# Patient Record
Sex: Male | Born: 1940 | Race: White | Hispanic: No | Marital: Married | Smoking: Never smoker
Health system: Southern US, Community
[De-identification: ages and names within clinical notes are randomized; demographics above are authoritative.]

## PROBLEM LIST (undated history)

## (undated) DIAGNOSIS — K219 Gastro-esophageal reflux disease without esophagitis: Secondary | ICD-10-CM

## (undated) DIAGNOSIS — E785 Hyperlipidemia, unspecified: Secondary | ICD-10-CM

## (undated) DIAGNOSIS — K635 Polyp of colon: Secondary | ICD-10-CM

## (undated) DIAGNOSIS — E0590 Thyrotoxicosis, unspecified without thyrotoxic crisis or storm: Secondary | ICD-10-CM

## (undated) DIAGNOSIS — I10 Essential (primary) hypertension: Secondary | ICD-10-CM

## (undated) DIAGNOSIS — B9681 Helicobacter pylori [H. pylori] as the cause of diseases classified elsewhere: Secondary | ICD-10-CM

## (undated) DIAGNOSIS — K2970 Gastritis, unspecified, without bleeding: Secondary | ICD-10-CM

## (undated) DIAGNOSIS — R7303 Prediabetes: Secondary | ICD-10-CM

## (undated) DIAGNOSIS — M1990 Unspecified osteoarthritis, unspecified site: Secondary | ICD-10-CM

## (undated) DIAGNOSIS — M109 Gout, unspecified: Secondary | ICD-10-CM

## (undated) HISTORY — DX: Essential (primary) hypertension: I10

## (undated) HISTORY — DX: Unspecified osteoarthritis, unspecified site: M19.90

## (undated) HISTORY — DX: Helicobacter pylori (H. pylori) as the cause of diseases classified elsewhere: B96.81

## (undated) HISTORY — DX: Gout, unspecified: M10.9

## (undated) HISTORY — DX: Thyrotoxicosis, unspecified without thyrotoxic crisis or storm: E05.90

## (undated) HISTORY — DX: Prediabetes: R73.03

## (undated) HISTORY — DX: Polyp of colon: K63.5

## (undated) HISTORY — DX: Gastro-esophageal reflux disease without esophagitis: K21.9

## (undated) HISTORY — PX: MOLE REMOVAL: SHX2046

## (undated) HISTORY — DX: Gastritis, unspecified, without bleeding: K29.70

## (undated) HISTORY — DX: Hyperlipidemia, unspecified: E78.5

---

## 1976-02-18 HISTORY — PX: VASECTOMY: SHX75

## 1997-02-17 HISTORY — PX: BICEPS TENDON REPAIR: SHX566

## 2001-04-07 ENCOUNTER — Ambulatory Visit (HOSPITAL_COMMUNITY): Admission: RE | Admit: 2001-04-07 | Discharge: 2001-04-07 | Payer: Self-pay | Admitting: Internal Medicine

## 2005-10-29 ENCOUNTER — Encounter: Admission: RE | Admit: 2005-10-29 | Discharge: 2005-10-29 | Payer: Self-pay | Admitting: Internal Medicine

## 2005-11-18 ENCOUNTER — Inpatient Hospital Stay (HOSPITAL_COMMUNITY): Admission: RE | Admit: 2005-11-18 | Discharge: 2005-11-19 | Payer: Self-pay | Admitting: Orthopedic Surgery

## 2006-05-26 ENCOUNTER — Ambulatory Visit: Payer: Self-pay | Admitting: Gastroenterology

## 2006-06-10 ENCOUNTER — Ambulatory Visit: Payer: Self-pay | Admitting: Gastroenterology

## 2008-02-18 HISTORY — PX: BACK SURGERY: SHX140

## 2010-07-04 ENCOUNTER — Other Ambulatory Visit: Payer: Self-pay | Admitting: Dermatology

## 2011-03-27 DIAGNOSIS — L821 Other seborrheic keratosis: Secondary | ICD-10-CM | POA: Diagnosis not present

## 2011-03-27 DIAGNOSIS — D239 Other benign neoplasm of skin, unspecified: Secondary | ICD-10-CM | POA: Diagnosis not present

## 2011-03-27 DIAGNOSIS — D1801 Hemangioma of skin and subcutaneous tissue: Secondary | ICD-10-CM | POA: Diagnosis not present

## 2011-03-27 DIAGNOSIS — D485 Neoplasm of uncertain behavior of skin: Secondary | ICD-10-CM | POA: Diagnosis not present

## 2011-03-27 DIAGNOSIS — L905 Scar conditions and fibrosis of skin: Secondary | ICD-10-CM | POA: Diagnosis not present

## 2011-04-15 DIAGNOSIS — J069 Acute upper respiratory infection, unspecified: Secondary | ICD-10-CM | POA: Diagnosis not present

## 2011-04-15 DIAGNOSIS — N401 Enlarged prostate with lower urinary tract symptoms: Secondary | ICD-10-CM | POA: Diagnosis not present

## 2011-04-15 DIAGNOSIS — I10 Essential (primary) hypertension: Secondary | ICD-10-CM | POA: Diagnosis not present

## 2011-04-15 DIAGNOSIS — E119 Type 2 diabetes mellitus without complications: Secondary | ICD-10-CM | POA: Diagnosis not present

## 2011-04-17 DIAGNOSIS — D485 Neoplasm of uncertain behavior of skin: Secondary | ICD-10-CM | POA: Diagnosis not present

## 2011-05-19 ENCOUNTER — Encounter: Payer: Self-pay | Admitting: Internal Medicine

## 2011-09-16 DIAGNOSIS — E119 Type 2 diabetes mellitus without complications: Secondary | ICD-10-CM | POA: Diagnosis not present

## 2011-09-16 DIAGNOSIS — M1990 Unspecified osteoarthritis, unspecified site: Secondary | ICD-10-CM | POA: Diagnosis not present

## 2011-09-16 DIAGNOSIS — Z23 Encounter for immunization: Secondary | ICD-10-CM | POA: Diagnosis not present

## 2011-09-16 DIAGNOSIS — I10 Essential (primary) hypertension: Secondary | ICD-10-CM | POA: Diagnosis not present

## 2011-11-18 DIAGNOSIS — Z23 Encounter for immunization: Secondary | ICD-10-CM | POA: Diagnosis not present

## 2011-12-19 DIAGNOSIS — E119 Type 2 diabetes mellitus without complications: Secondary | ICD-10-CM | POA: Diagnosis not present

## 2011-12-19 DIAGNOSIS — E039 Hypothyroidism, unspecified: Secondary | ICD-10-CM | POA: Diagnosis not present

## 2011-12-19 DIAGNOSIS — Z125 Encounter for screening for malignant neoplasm of prostate: Secondary | ICD-10-CM | POA: Diagnosis not present

## 2011-12-19 DIAGNOSIS — M109 Gout, unspecified: Secondary | ICD-10-CM | POA: Diagnosis not present

## 2011-12-19 DIAGNOSIS — E785 Hyperlipidemia, unspecified: Secondary | ICD-10-CM | POA: Diagnosis not present

## 2011-12-26 DIAGNOSIS — Z125 Encounter for screening for malignant neoplasm of prostate: Secondary | ICD-10-CM | POA: Diagnosis not present

## 2011-12-26 DIAGNOSIS — Z0000 Encounter for general adult medical examination without abnormal findings: Secondary | ICD-10-CM | POA: Diagnosis not present

## 2011-12-26 DIAGNOSIS — Z1331 Encounter for screening for depression: Secondary | ICD-10-CM | POA: Diagnosis not present

## 2011-12-26 DIAGNOSIS — E119 Type 2 diabetes mellitus without complications: Secondary | ICD-10-CM | POA: Diagnosis not present

## 2012-03-12 ENCOUNTER — Encounter: Payer: Self-pay | Admitting: Gastroenterology

## 2012-03-29 ENCOUNTER — Other Ambulatory Visit: Payer: Self-pay | Admitting: Dermatology

## 2012-03-29 DIAGNOSIS — D1801 Hemangioma of skin and subcutaneous tissue: Secondary | ICD-10-CM | POA: Diagnosis not present

## 2012-03-29 DIAGNOSIS — L820 Inflamed seborrheic keratosis: Secondary | ICD-10-CM | POA: Diagnosis not present

## 2012-03-29 DIAGNOSIS — L821 Other seborrheic keratosis: Secondary | ICD-10-CM | POA: Diagnosis not present

## 2012-03-29 DIAGNOSIS — Z85828 Personal history of other malignant neoplasm of skin: Secondary | ICD-10-CM | POA: Diagnosis not present

## 2012-03-29 DIAGNOSIS — L819 Disorder of pigmentation, unspecified: Secondary | ICD-10-CM | POA: Diagnosis not present

## 2012-03-29 DIAGNOSIS — D485 Neoplasm of uncertain behavior of skin: Secondary | ICD-10-CM | POA: Diagnosis not present

## 2012-04-16 DIAGNOSIS — R197 Diarrhea, unspecified: Secondary | ICD-10-CM | POA: Diagnosis not present

## 2012-05-03 ENCOUNTER — Encounter: Payer: Self-pay | Admitting: Gastroenterology

## 2012-05-11 DIAGNOSIS — E119 Type 2 diabetes mellitus without complications: Secondary | ICD-10-CM | POA: Diagnosis not present

## 2012-05-11 DIAGNOSIS — N509 Disorder of male genital organs, unspecified: Secondary | ICD-10-CM | POA: Diagnosis not present

## 2012-05-11 DIAGNOSIS — R05 Cough: Secondary | ICD-10-CM | POA: Diagnosis not present

## 2012-05-11 DIAGNOSIS — R197 Diarrhea, unspecified: Secondary | ICD-10-CM | POA: Diagnosis not present

## 2012-05-11 DIAGNOSIS — I10 Essential (primary) hypertension: Secondary | ICD-10-CM | POA: Diagnosis not present

## 2012-05-13 ENCOUNTER — Encounter: Payer: Self-pay | Admitting: *Deleted

## 2012-05-20 ENCOUNTER — Encounter: Payer: Self-pay | Admitting: Gastroenterology

## 2012-05-20 ENCOUNTER — Ambulatory Visit (INDEPENDENT_AMBULATORY_CARE_PROVIDER_SITE_OTHER): Payer: Medicare Other | Admitting: Gastroenterology

## 2012-05-20 VITALS — BP 148/70 | HR 80 | Ht 73.0 in | Wt 212.0 lb

## 2012-05-20 DIAGNOSIS — Z86010 Personal history of colonic polyps: Secondary | ICD-10-CM

## 2012-05-20 DIAGNOSIS — Z800 Family history of malignant neoplasm of digestive organs: Secondary | ICD-10-CM

## 2012-05-20 DIAGNOSIS — K625 Hemorrhage of anus and rectum: Secondary | ICD-10-CM

## 2012-05-20 MED ORDER — MOVIPREP 100 G PO SOLR: 1.0000 | Freq: Once | ORAL | Status: DC

## 2012-05-20 NOTE — Progress Notes (Signed)
History of Present Illness:  This is a very nice 72 year old Caucasian male who recently had drug-induced diarrhea which seemed to resolve with change in his blood pressure medications.  He currently denies GI complaints except for gas with belching and burping but no true reflux symptoms or dysphagia.  His colonoscopy in 2008 was remarkable for multiple small adenomatous.  With his recent rather severe diarrhea he did have some rectal bleeding.  His possible that he may have had a bike induced diarrhea, and he has responded to 10 days of [place].  Apparently has not had stool cultures performed.  Bowel movements currently are formed and nonbloody, he denies abdominal pain, hepatobiliary or systemic complaints.  His appetite is good and his weight is stable without any specific food intolerances.  However, his family history is remarkable for colon cancer in his brother in his 14s.  I have reviewed this patient's present history, medical and surgical past history, allergies and medications.     ROS:   All systems were reviewed and are negative unless otherwise stated in the HPI.    Physical Exam: Healthy patient in no distress appearing his stated age.  Blood pressure 140/70, pulse 80 and regular, and weight 212 the BMI of 27.98.  He cannot appreciate stigmata of chronic liver disease. General well developed well nourished patient in no acute distress, appearing their stated age Eyes PERRLA, no icterus, fundoscopic exam per opthamologist Skin no lesions noted Neck supple, no adenopathy, no thyroid enlargement, no tenderness Chest clear to percussion and auscultation Heart no significant murmurs, gallops or rubs noted Abdomen no hepatosplenomegaly masses or tenderness, BS normal. .  Her Coumadin abdomen but no definite organomegaly, masses, tenderness, or ascites. Extremities no acute joint lesions, edema, phlebitis or evidence of cellulitis. Neurologic patient oriented x 3, cranial nerves intact,  no focal neurologic deficits noted. Psychological mental status normal and normal affect.  Assessment and plan: Probable drug-induced diarrhea and possible antibiotic-induced diarrhea which seems to have resolved with probiotic therapy.  I have scheduled him for followup colonoscopy because of his history of colon adenomas, family history of colon cancer, and recent rectal bleeding which may of been hemorrhoidal in nature.  He is to continue his other medications as per his primary care physician.  No diagnosis found.

## 2012-05-20 NOTE — Patient Instructions (Signed)
  You have been scheduled for a colonoscopy with propofol. Please follow written instructions given to you at your visit today.  Please pick up your prep kit at the pharmacy within the next 1-3 days. If you use inhalers (even only as needed), please bring them with you on the day of your procedure. _______________________________________________________________________________________________                                                 We are excited to introduce MyChart, a new best-in-class service that provides you online access to important information in your electronic medical record. We want to make it easier for you to view your health information - all in one secure location - when and where you need it. We expect MyChart will enhance the quality of care and service we provide.  When you register for MyChart, you can:    View your test results.    Request appointments and receive appointment reminders via email.    Request medication renewals.    View your medical history, allergies, medications and immunizations.    Communicate with your physician's office through a password-protected site.    Conveniently print information such as your medication lists.  To find out if MyChart is right for you, please talk to a member of our clinical staff today. We will gladly answer your questions about this free health and wellness tool.  If you are age 72 or older and want a member of your family to have access to your record, you must provide written consent by completing a proxy form available at our office. Please speak to our clinical staff about guidelines regarding accounts for patients younger than age 18.  As you activate your MyChart account and need any technical assistance, please call the MyChart technical support line at [phone] ([phone]) or email your question to [email or web address]. If you email your question(s), please include your name, a return phone  number and the best time to reach you.  If you have non-urgent health-related questions, you can send a message to our office through MyChart at [email or web address]. If you have a medical emergency, call 911.  Thank you for using MyChart as your new health and wellness resource!   MyChart licensed from [employer],  1999-2010. Patents Pending.    

## 2012-05-31 ENCOUNTER — Ambulatory Visit (AMBULATORY_SURGERY_CENTER): Payer: Medicare Other | Admitting: Gastroenterology

## 2012-05-31 ENCOUNTER — Encounter (HOSPITAL_COMMUNITY): Payer: Self-pay | Admitting: Oncology

## 2012-05-31 ENCOUNTER — Ambulatory Visit (INDEPENDENT_AMBULATORY_CARE_PROVIDER_SITE_OTHER)
Admission: RE | Admit: 2012-05-31 | Discharge: 2012-05-31 | Disposition: A | Discharge status: Home / Self Care | Payer: Medicare Other | Source: Ambulatory Visit | Attending: Gastroenterology | Admitting: Gastroenterology

## 2012-05-31 ENCOUNTER — Other Ambulatory Visit: Payer: Self-pay | Admitting: Physician Assistant

## 2012-05-31 ENCOUNTER — Observation Stay (HOSPITAL_COMMUNITY): Payer: Medicare Other

## 2012-05-31 ENCOUNTER — Observation Stay (HOSPITAL_COMMUNITY)
Admission: AD | Admit: 2012-05-31 | Discharge: 2012-06-01 | Disposition: A | Discharge status: Home / Self Care | Payer: Medicare Other | Source: Ambulatory Visit | Attending: Gastroenterology | Admitting: Gastroenterology

## 2012-05-31 ENCOUNTER — Encounter: Payer: Self-pay | Admitting: Gastroenterology

## 2012-05-31 ENCOUNTER — Other Ambulatory Visit: Payer: Self-pay | Admitting: *Deleted

## 2012-05-31 VITALS — BP 124/54 | HR 50 | Temp 98.6°F | Resp 15 | Ht 73.0 in | Wt 212.0 lb

## 2012-05-31 DIAGNOSIS — K625 Hemorrhage of anus and rectum: Secondary | ICD-10-CM | POA: Diagnosis not present

## 2012-05-31 DIAGNOSIS — K6389 Other specified diseases of intestine: Secondary | ICD-10-CM | POA: Diagnosis not present

## 2012-05-31 DIAGNOSIS — R52 Pain, unspecified: Principal | ICD-10-CM | POA: Insufficient documentation

## 2012-05-31 DIAGNOSIS — R1084 Generalized abdominal pain: Secondary | ICD-10-CM | POA: Diagnosis not present

## 2012-05-31 DIAGNOSIS — E785 Hyperlipidemia, unspecified: Secondary | ICD-10-CM | POA: Diagnosis not present

## 2012-05-31 DIAGNOSIS — E039 Hypothyroidism, unspecified: Secondary | ICD-10-CM | POA: Diagnosis not present

## 2012-05-31 DIAGNOSIS — I10 Essential (primary) hypertension: Secondary | ICD-10-CM | POA: Insufficient documentation

## 2012-05-31 DIAGNOSIS — Z9889 Other specified postprocedural states: Secondary | ICD-10-CM

## 2012-05-31 DIAGNOSIS — Z860100 Personal history of colon polyps, unspecified: Secondary | ICD-10-CM

## 2012-05-31 DIAGNOSIS — R109 Unspecified abdominal pain: Secondary | ICD-10-CM

## 2012-05-31 DIAGNOSIS — K5730 Diverticulosis of large intestine without perforation or abscess without bleeding: Secondary | ICD-10-CM | POA: Diagnosis not present

## 2012-05-31 DIAGNOSIS — R100 Acute abdomen: Secondary | ICD-10-CM

## 2012-05-31 LAB — COMPREHENSIVE METABOLIC PANEL
Albumin: 4 g/dL (ref 3.5–5.2)
Alkaline Phosphatase: 68 U/L (ref 39–117)
BUN: 24 mg/dL — ABNORMAL HIGH (ref 6–23)
Calcium: 9.3 mg/dL (ref 8.4–10.5)
Creatinine, Ser: 0.96 mg/dL (ref 0.50–1.35)
Potassium: 3.3 mEq/L — ABNORMAL LOW (ref 3.5–5.1)
Total Protein: 7.2 g/dL (ref 6.0–8.3)

## 2012-05-31 LAB — PROTIME-INR
INR: 0.97 (ref 0.00–1.49)
Prothrombin Time: 12.8 seconds (ref 11.6–15.2)

## 2012-05-31 LAB — CBC
HCT: 38 % — ABNORMAL LOW (ref 39.0–52.0)
MCHC: 35.5 g/dL (ref 30.0–36.0)
RDW: 13.4 % (ref 11.5–15.5)

## 2012-05-31 MED ORDER — LEVOTHYROXINE SODIUM 50 MCG PO TABS
50.0000 ug | ORAL_TABLET | Freq: Every day | ORAL | Status: DC
  Administered 2012-06-01: 50 ug via ORAL
  Filled 2012-05-31 (×2): qty 1

## 2012-05-31 MED ORDER — IOHEXOL 300 MG/ML  SOLN
100.0000 mL | Freq: Once | INTRAMUSCULAR | Status: AC | PRN
  Administered 2012-05-31: 100 mL via INTRAVENOUS

## 2012-05-31 MED ORDER — ONDANSETRON HCL 4 MG PO TABS: 4.0000 mg | ORAL_TABLET | Freq: Four times a day (QID) | ORAL | Status: DC | PRN

## 2012-05-31 MED ORDER — KCL IN DEXTROSE-NACL 10-5-0.45 MEQ/L-%-% IV SOLN
INTRAVENOUS | Status: DC
  Administered 2012-05-31: 18:00:00 via INTRAVENOUS
  Filled 2012-05-31 (×3): qty 1000

## 2012-05-31 MED ORDER — HYDROMORPHONE HCL PF 1 MG/ML IJ SOLN: 1.0000 mg | INTRAMUSCULAR | Status: DC | PRN

## 2012-05-31 MED ORDER — DILTIAZEM HCL ER COATED BEADS 240 MG PO CP24
240.0000 mg | ORAL_CAPSULE | Freq: Every day | ORAL | Status: DC
  Administered 2012-06-01: 240 mg via ORAL
  Filled 2012-05-31: qty 1

## 2012-05-31 MED ORDER — ONDANSETRON HCL 4 MG/2ML IJ SOLN: 4.0000 mg | Freq: Four times a day (QID) | INTRAMUSCULAR | Status: DC | PRN

## 2012-05-31 MED ORDER — ACETAMINOPHEN 325 MG PO TABS: 650.0000 mg | ORAL_TABLET | Freq: Four times a day (QID) | ORAL | Status: DC | PRN

## 2012-05-31 MED ORDER — ACETAMINOPHEN 650 MG RE SUPP: 650.0000 mg | Freq: Four times a day (QID) | RECTAL | Status: DC | PRN

## 2012-05-31 MED ORDER — SODIUM CHLORIDE 0.9 % IV SOLN: 500.0000 mL | INTRAVENOUS | Status: DC

## 2012-05-31 MED ORDER — IOHEXOL 300 MG/ML  SOLN: 50.0000 mL | Freq: Once | INTRAMUSCULAR | Status: AC | PRN

## 2012-05-31 NOTE — Patient Instructions (Addendum)
YOU HAD AN ENDOSCOPIC PROCEDURE TODAY AT THE [facility]: Refer to the procedure report that was given to you for any specific questions about what was found during the examination.  If the procedure report does not answer your questions, please call your gastroenterologist to clarify.  If you requested that your care partner not be given the details of your procedure findings, then the procedure report has been included in a sealed envelope for you to review at your convenience later.  YOU SHOULD EXPECT: Some feelings of bloating in the abdomen. Passage of more gas than usual.  Walking can help get rid of the air that was put into your GI tract during the procedure and reduce the bloating. If you had a lower endoscopy (such as a colonoscopy or flexible sigmoidoscopy) you may notice spotting of blood in your stool or on the toilet paper. If you underwent a bowel prep for your procedure, then you may not have a normal bowel movement for a few days. AVOID ALL GASSY AND FRIED FOODS TODAY DUE TO YOUR EXCESSIVE GAS PAINS.  DIET: Your first meal following the procedure should be a light meal and then it is ok to progress to your normal diet.  A half-sandwich or bowl of soup is an example of a good first meal.  Heavy or fried foods are harder to digest and may make you feel nauseous or bloated.  Likewise meals heavy in dairy and vegetables can cause extra gas to form and this can also increase the bloating.  Drink plenty of fluids but you should avoid alcoholic beverages for 24 hours.  ACTIVITY: Your care partner should take you home directly after the procedure.  You should plan to take it easy, moving slowly for the rest of the day.  You can resume normal activity the day after the procedure however you should NOT DRIVE or use heavy machinery for 24 hours (because of the sedation medicines used during the test).    SYMPTOMS TO REPORT IMMEDIATELY: A gastroenterologist can be reached at any hour.   During normal business hours, 8:30 AM to 5:00 PM Monday through Friday, call [phone].  After hours and on weekends, please call the GI answering service at [phone] who will take a message and have the physician on call contact you.   Following lower endoscopy (colonoscopy or flexible sigmoidoscopy):  Excessive amounts of blood in the stool  Significant tenderness or worsening of abdominal pains  Swelling of the abdomen that is new, acute  Fever of 100F or higher  FOLLOW UP: If any biopsies were taken you will be contacted by phone or by letter within the next 1-3 weeks.  Call your gastroenterologist if you have not heard about the biopsies in 3 weeks.  Our staff will call the home number listed on your records the next business day following your procedure to check on you and address any questions or concerns that you may have at that time regarding the information given to you following your procedure. This is a courtesy call and so if there is no answer at the home number and we have not heard from you through the emergency physician on call, we will assume that you have returned to your regular daily activities without incident.  SIGNATURES/CONFIDENTIALITY: You and/or your care partner have signed paperwork which will be entered into your electronic medical record.  These signatures attest to the fact that that the information above on your After Visit Summary has  been reviewed and is understood.  Full responsibility of the confidentiality of this discharge information lies with you and/or your care-partner.

## 2012-05-31 NOTE — H&P (Signed)
I agree with assessment and plan as per Mike Gip, physician's assistant,,,

## 2012-05-31 NOTE — Progress Notes (Addendum)
Patient admitted to recovery room with a lot of pain in abd.  He states #10 .  Patient given Levsin and placed in trendelenburg position.  Really didn't work very well, so  A rectal tube was placed with little results.   Dr. Jarold Motto out to examine patient at this time, and he sat the patient up.  Patient is burping, but abdomen is still distended.  Patient had two rounds of levsin without any results  Patient is still hurting after getting up and going to the bathroom.  KUB ordered by Dr. Jarold Motto and patient went downstairs for his X-ray.  Patient is back on the floor in holding room.  Remains in pain; Dr. Jarold Motto informed.  Dr. Jarold Motto to read X-ray.  Patient back from X-ray, and Dr. Jarold Motto will adm pt to hospital for overnight observation.    Amy Bretta Bang came and gave the hospital report.  Patient has a direct admit to hospital.  Patient to ride in car with wife.  OK with Dr. Jarold Motto.  Patient passing gas while in waiting area.  Patient experienced a hospital transfer or hospital admission upon discharge from [facility]. [phone])

## 2012-05-31 NOTE — H&P (Signed)
Primary Care Physician:  Hoyle Sauer, MD Primary Gastroenterologist:    Dr. Sheryn Bison  CHIEF COMPLAINT:  Acute severe abdominal pain post colonoscopy  HPI: Zachary Mcdonald is a 72 y.o. male known recently to Dr. Jarold Motto who had been seen in the office after a drug induced diarrhea. He had had prior colonoscopy in 2008 which showed multiple small adenomatous polyps area he was scheduled for followup colonoscopy which she had this afternoon. This was an unremarkable exam with the exception of left-sided diverticulosis. Post procedure patient complaints of fairly intense generalized abdominal pain which has persisted and has not improved with passage of some flatus. He is distended and uncomfortable but hemodynamically stable. Stat abdominal films were done in the showed diffuse gaseous distention of the colon maximally in the right and transverse segments but no evidence of free air. He is admitted to the hospital on observation for pain control and stat CT of the abdomen and pelvis. It was noted at the time of colonoscopy that he does have an umbilical hernia which may have become incarcerated  with the procedure,also consider  acute pain secondary to trapped air.   Past Medical History  Diagnosis Date  . Colon polyps     Tubular Adenoma   . Hyperthyroidism   . Hypertension   . Borderline diabetes   . Hyperlipidemia   . Gout     Past Surgical History  Procedure Laterality Date  . Vasectomy    . Back surgery    . Mole removal    . Biceps tendon repair      Prior to Admission medications   Medication Sig Start Date End Date Taking? Authorizing Provider  allopurinol (ZYLOPRIM) 300 MG tablet Take 300 mg by mouth daily.    Historical Provider, MD  diltiazem (TIAZAC) 420 MG 24 hr capsule Take 420 mg by mouth daily.    Historical Provider, MD  fish oil-omega-3 fatty acids 1000 MG capsule Take 2 g by mouth daily.    Historical Provider, MD  ibuprofen (ADVIL,MOTRIN) 800 MG  tablet Take 800 mg by mouth 2 (two) times daily.    Historical Provider, MD  levothyroxine (SYNTHROID, LEVOTHROID) 50 MCG tablet Take 50 mcg by mouth daily before breakfast.    Historical Provider, MD  olmesartan (BENICAR) 40 MG tablet Take 40 mg by mouth daily.    Historical Provider, MD  simvastatin (ZOCOR) 40 MG tablet Take 40 mg by mouth every evening.    Historical Provider, MD  tadalafil (CIALIS) 20 MG tablet Take 20 mg by mouth daily as needed for erectile dysfunction.    Historical Provider, MD  traMADol (ULTRAM) 50 MG tablet Take 50 mg by mouth every 8 (eight) hours as needed for pain.    Historical Provider, MD    Current Outpatient Prescriptions  Medication Sig Dispense Refill  . allopurinol (ZYLOPRIM) 300 MG tablet Take 300 mg by mouth daily.      Marland Kitchen diltiazem (TIAZAC) 420 MG 24 hr capsule Take 420 mg by mouth daily.      . fish oil-omega-3 fatty acids 1000 MG capsule Take 2 g by mouth daily.      Marland Kitchen ibuprofen (ADVIL,MOTRIN) 800 MG tablet Take 800 mg by mouth 2 (two) times daily.      Marland Kitchen levothyroxine (SYNTHROID, LEVOTHROID) 50 MCG tablet Take 50 mcg by mouth daily before breakfast.      . olmesartan (BENICAR) 40 MG tablet Take 40 mg by mouth daily.      . simvastatin (  ZOCOR) 40 MG tablet Take 40 mg by mouth every evening.      . tadalafil (CIALIS) 20 MG tablet Take 20 mg by mouth daily as needed for erectile dysfunction.      . traMADol (ULTRAM) 50 MG tablet Take 50 mg by mouth every 8 (eight) hours as needed for pain.       No current facility-administered medications for this visit.   Facility-Administered Medications Ordered in Other Visits  Medication Dose Route Frequency Provider Last Rate Last Dose  . 0.9 %  sodium chloride infusion  500 mL Intravenous Continuous Mardella Layman, MD        Allergies as of June 05, 2012 - Review Complete June 05, 2012  Allergen Reaction Noted  . Losartan  05/20/2012    Family History  Problem Relation Age of Onset  . Diabetes Mother   .  Colon cancer Brother     28's  . Diabetes Brother     History   Social History  . Marital Status: Married    Spouse Name: N/A    Number of Children: 4  . Years of Education: N/A   Occupational History  . Retired    Social History Main Topics  . Smoking status: Never Smoker   . Smokeless tobacco: Never Used  . Alcohol Use: No  . Drug Use: No  . Sexually Active: Not on file   Other Topics Concern  . Not on file   Social History Narrative  . No narrative on file    Review of Systems: Pertinent positive and negative review of systems were noted in the above HPI section.  All other review of systems was otherwise negative.  Physical Exam: Vital signs in last 24 hours: @VSRANGES @   General:   Alert,  well-developed, cooperative white male in NAD-uncomfortable -holding abdomen Head:  Normocephalic and atraumatic. Pale Eyes:  Sclera clear, no icterus.   Conjunctiva pink. Ears:  Normal auditory acuity. Mouth:  No deformity or lesions.  Neck:  Supple; no masses . Lungs:  Clear throughout to auscultation.   No wheezes, crackles, or rhonchi. No acute distress. Heart:  Regular rate and rhythm; no murmurs. Abdomen:  Distended, tympanitic, bowel sounds are present, he is diffusely tender, and focally tender. Umbilically. No palpable mass or hepatosplenomegaly he does have some rebound  Rectal:  Not done-just had colonoscopy today Msk:  Symmetrical without gross deformities.. Pulses:  Normal pulses noted. Extremities:  Without edema. Neurologic:  Alert and  oriented x4;  grossly normal neurologically. Skin:  Intact without significant lesions or rashes. Cervical Nodes:  No significant cervical adenopathy. Psych:  Alert and cooperative. Normal mood and affect.  Lab Results:Pending  Studies/Results: Dg Abd 2 Views  06/05/2012  *RADIOLOGY REPORT*  Clinical Data: 72 year old male status post colonoscopy with abdominal pain and distention.  ABDOMEN - 2 VIEW  Comparison: Lumbar  radiographs 10/29/2005.  Findings: Diffuse gas distended colon in keeping with recent colonoscopy.  Ascending and transverse colon most distended.  Gas filled small bowel loops are within normal limits.  No pneumoperitoneum evident on the upright view.  Chronic degenerative changes in the lumbar spine.  IMPRESSION: Diffuse gaseous distention of colon, maximal in the right and transverse segments, compatible with recent colonoscopy.   Original Report Authenticated By: Erskine Speed, M.D.     Impression / Plan:  #101  72 year-old male with acute severe abdominal pain and abdominal distention post colonoscopy done this afternoon. No pneumoperitoneum appreciated on plain abdominal films.. Will rule out  colonic perforation, incarceration of umbilical hernia, versus colonic ileus/trapped air. #2 hypertension #3 hyperlipidemia #4 hypothyroidism #5 issue of multiple adenomatous polyps on prior colonoscopy-no polyps on today's exam #6 left colon diverticulosis  Plan; Patient will be admitted to observation on the GI service for stat labs, pain control, and stat CT scan of the abdomen and pelvis. He will be n.p.o. until CT is reviewed.      @RRHLOS @  Amy Esterwood  05/31/2012, 3:51 PM

## 2012-05-31 NOTE — Op Note (Addendum)
[facility] [address] [place], [address]   COLONOSCOPY PROCEDURE REPORT  PATIENT: Zachary Mcdonald, Zachary Mcdonald  MR#: [id number] BIRTHDATE: 1940-02-23 , 71  yrs. old GENDER: Male ENDOSCOPIST: Mardella Layman, MD, [facility] REFERRED BY: PROCEDURE DATE:  05/31/2012 PROCEDURE:   Colonoscopy, surveillance ASA CLASS:   Class II INDICATIONS:Average risk patient for colon cancer. MEDICATIONS: Propofol (Diprivan) 120 mg  DESCRIPTION OF PROCEDURE:   After the risks and benefits and of the procedure were explained, informed consent was obtained.  A digital rectal exam revealed no abnormalities of the rectum.    The endoscope was introduced through the anus and advanced to the cecum, which was identified by both the appendix and ileocecal valve .  The quality of the prep was adequate, using MoviPrep . The instrument was then slowly withdrawn as the colon was fully examined.     COLON FINDINGS: Moderate diverticulosis was noted in the descending colon and sigmoid colon.   The colon was otherwise normal.  There was no diverticulosis, inflammation, polyps or cancers unless previously stated.     Retroflexed views revealed no abnormalities. The scope was then withdrawn from the patient and the procedure completed.  COMPLICATIONS: A hospital admission related to the  pain that occurred after the procedure . Po levsin and rectal tube passed...gas passed,and still with 4/10 pain.  KUB shows gas outline of colon,cannot R/O free air per diaphragm not seen well.This patient has an umbilical hernia noted also.will admit for CT scan and surgical consult as needed. ENDOSCOPIC IMPRESSION: 1.   Moderate diverticulosis was noted in the descending colon and sigmoid colon 2.   The colon was otherwise normal ..no polyps or cancer.  RECOMMENDATIONS: 1.  High fiber diet 2.  Continue current colorectal screening recommendations for "routine risk" patients with a repeat colonoscopy in 10  years. 3. Admit for observation and abd-pelvic scan.  REPEAT EXAM:  [id number] Avva, MD  _______________________________ eSignedMardella Layman, MD, [facility] 07/14/2012 9:36 AM Revised: 07/14/2012 9:36 AM

## 2012-06-01 ENCOUNTER — Telehealth: Payer: Self-pay

## 2012-06-01 DIAGNOSIS — R109 Unspecified abdominal pain: Secondary | ICD-10-CM

## 2012-06-01 DIAGNOSIS — Z9889 Other specified postprocedural states: Secondary | ICD-10-CM | POA: Diagnosis not present

## 2012-06-01 DIAGNOSIS — R1084 Generalized abdominal pain: Secondary | ICD-10-CM | POA: Diagnosis not present

## 2012-06-01 DIAGNOSIS — R52 Pain, unspecified: Secondary | ICD-10-CM | POA: Diagnosis not present

## 2012-06-01 DIAGNOSIS — K5730 Diverticulosis of large intestine without perforation or abscess without bleeding: Secondary | ICD-10-CM | POA: Diagnosis not present

## 2012-06-01 DIAGNOSIS — E039 Hypothyroidism, unspecified: Secondary | ICD-10-CM | POA: Diagnosis not present

## 2012-06-01 DIAGNOSIS — I10 Essential (primary) hypertension: Secondary | ICD-10-CM | POA: Diagnosis not present

## 2012-06-01 DIAGNOSIS — E785 Hyperlipidemia, unspecified: Secondary | ICD-10-CM | POA: Diagnosis not present

## 2012-06-01 LAB — CBC WITH DIFFERENTIAL/PLATELET
Hemoglobin: 12.2 g/dL — ABNORMAL LOW (ref 13.0–17.0)
Lymphocytes Relative: 41 % (ref 12–46)
Lymphs Abs: 2.6 10*3/uL (ref 0.7–4.0)
Monocytes Relative: 8 % (ref 3–12)
Neutro Abs: 3.2 10*3/uL (ref 1.7–7.7)
Neutrophils Relative %: 51 % (ref 43–77)
RBC: 4.02 MIL/uL — ABNORMAL LOW (ref 4.22–5.81)
WBC: 6.4 10*3/uL (ref 4.0–10.5)

## 2012-06-01 NOTE — Progress Notes (Signed)
Pt is to be discharged home today. Pt is in NAD, IV is out, all paperwork has been reviewed/discussed with patient, and there are no questions/concerns at this time. Assessment is unchanged from this morning. Pt is to be accompanied downstairs by staff and family. Pt chose to ambulate downstairs and refused use of wheelchair.

## 2012-06-01 NOTE — Discharge Summary (Signed)
[facility] Discharge Summary  Name: Zachary Mcdonald MRN: [id number] DOB: 01-May-1940 72 y.o. PCP:  Hoyle Sauer, MD  Date of Admission: 05/31/2012  4:09 PM Date of Discharge: 06/01/2012 Attending Physician: Mardella Layman, MD  Discharge Diagnosis: Active Problems:   Abdominal  pain, other specified site  Consultations:  None  Procedures Performed:  Ct Abdomen Pelvis W Contrast  05/31/2012  *RADIOLOGY REPORT*  Clinical Data: Severe abdominal pain after colonoscopy today.  CT ABDOMEN AND PELVIS WITH CONTRAST  Technique:  Multidetector CT imaging of the abdomen and pelvis was performed following the standard protocol during bolus administration of intravenous contrast.  Contrast: OMNIPAQUE IOHEXOL 300 MG/ML  SOLN  Comparison: Radiographs dated 05/31/2012  Findings: There is no free air or free fluid in the abdomen.  There is moderate air in the ascending and transverse portions of the colon but there is no obstruction.  The amount of air in the bowel has significantly diminished since the prior abdominal radiographs.  The small bowel is normal.  Stomach appears normal.  No appreciable diverticular disease.  The liver, biliary tree, spleen, pancreas, adrenal glands, and kidneys demonstrate no significant abnormalities.  7 mm cyst in the anterior aspect of the midportion of the right kidney.  No acute osseous abnormality.  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  IMPRESSION: No acute abnormalities.  No evidence of bowel perforation or other acute abnormalities.  The amount of air in the colon has appreciably diminished since the prior exam.   Original Report Authenticated By: Francene Boyers, M.D.    Dg Abd 2 Views  05/31/2012  *RADIOLOGY REPORT*  Clinical Data: 72 year old male status post colonoscopy with abdominal pain and distention.  ABDOMEN - 2 VIEW  Comparison: Lumbar radiographs 10/29/2005.  Findings: Diffuse gas distended colon in keeping with recent  colonoscopy.  Ascending and transverse colon most distended.  Gas filled small bowel loops are within normal limits.  No pneumoperitoneum evident on the upright view.  Chronic degenerative changes in the lumbar spine.  IMPRESSION: Diffuse gaseous distention of colon, maximal in the right and transverse segments, compatible with recent colonoscopy.   Original Report Authenticated By: Erskine Speed, M.D.     GI Procedures: None  History/Physical Exam:  See Admission H&P  Admission HPI: Patient underwent outpatient colonoscopy on 4/14.  Study was unremarkable except for left sided diverticulosis.  Post procedure he complained of intense generalized abdominal pain, which was persistent and not improved with passage of flatus.  STAT abdominal films showed diffuse gaseous distention of the colon c/w recent colonoscopy, but no free air.  He was admitted to [facility] hospital on observation for pain control and STAT CT scan.  CT scan was normal.  His pain dissipated overnight and when he was seen this AM he said that his pain had completely resolved.  No nausea or vomiting.  He was hungry and ate a full breakfast with no issues.  It was determined that patient could safely be discharged home.  Discharge Vitals:  BP 128/99  Pulse 52  Temp(Src) 98.2 F (36.8 C) (Oral)  Resp 16  Ht 6\' 1"  (1.854 m)  Wt 205 lb (92.987 kg)  BMI 27.05 kg/m2  SpO2 96%  Discharge Labs:  Results for orders placed during the hospital encounter of 05/31/12 (from the past 24 hour(s))  CBC     Status: Abnormal   Collection Time    05/31/12  4:36 PM      Result Value Range  WBC 6.6  4.0 - 10.5 K/uL   RBC 4.35  4.22 - 5.81 MIL/uL   Hemoglobin 13.5  13.0 - 17.0 g/dL   HCT 16.1 (*) 09.6 - 04.5 %   MCV 87.4  78.0 - 100.0 fL   MCH 31.0  26.0 - 34.0 pg   MCHC 35.5  30.0 - 36.0 g/dL   RDW 40.9  81.1 - 91.4 %   Platelets 221  150 - 400 K/uL  COMPREHENSIVE METABOLIC PANEL     Status: Abnormal   Collection Time    05/31/12  4:36 PM       Result Value Range   Sodium 144  135 - 145 mEq/L   Potassium 3.3 (*) 3.5 - 5.1 mEq/L   Chloride 107  96 - 112 mEq/L   CO2 27  19 - 32 mEq/L   Glucose, Bld 95  70 - 99 mg/dL   BUN 24 (*) 6 - 23 mg/dL   Creatinine, Ser 7.82  0.50 - 1.35 mg/dL   Calcium 9.3  8.4 - 95.6 mg/dL   Total Protein 7.2  6.0 - 8.3 g/dL   Albumin 4.0  3.5 - 5.2 g/dL   AST 21  0 - 37 U/L   ALT 25  0 - 53 U/L   Alkaline Phosphatase 68  39 - 117 U/L   Total Bilirubin 0.4  0.3 - 1.2 mg/dL   GFR calc non Af Amer 81 (*) >90 mL/min   GFR calc Af Amer >90  >90 mL/min  PROTIME-INR     Status: None   Collection Time    05/31/12  4:36 PM      Result Value Range   Prothrombin Time 12.8  11.6 - 15.2 seconds   INR 0.97  0.00 - 1.49  CBC WITH DIFFERENTIAL     Status: Abnormal   Collection Time    06/01/12  4:00 AM      Result Value Range   WBC 6.4  4.0 - 10.5 K/uL   RBC 4.02 (*) 4.22 - 5.81 MIL/uL   Hemoglobin 12.2 (*) 13.0 - 17.0 g/dL   HCT 21.3 (*) 08.6 - 57.8 %   MCV 86.1  78.0 - 100.0 fL   MCH 30.3  26.0 - 34.0 pg   MCHC 35.3  30.0 - 36.0 g/dL   RDW 46.9  62.9 - 52.8 %   Platelets 184  150 - 400 K/uL   Neutrophils Relative 51  43 - 77 %   Neutro Abs 3.2  1.7 - 7.7 K/uL   Lymphocytes Relative 41  12 - 46 %   Lymphs Abs 2.6  0.7 - 4.0 K/uL   Monocytes Relative 8  3 - 12 %   Monocytes Absolute 0.5  0.1 - 1.0 K/uL   Eosinophils Relative 0  0 - 5 %   Eosinophils Absolute 0.0  0.0 - 0.7 K/uL   Basophils Relative 0  0 - 1 %   Basophils Absolute 0.0  0.0 - 0.1 K/uL    Disposition and follow-up:   Zachary Mcdonald was discharged from [facility] in stable condition.    Follow-up Appointments:     Discharge Orders   Future Orders Complete By Expires     ABDOMINAL PROCEDURE/ANEURYSM REPAIR/AORTO-BIFEMORAL BYPASS:  Call MD for increased abdominal pain; cramping diarrhea; nausea/vomiting  As directed     Activity as tolerated - No restrictions  As directed     Call MD for:  severe or increased pain,  loss or decreased feeling  in affected limb(s)  As directed     Call MD for:  temperature >100.5  As directed     Resume previous diet  As directed        Discharge Medications:   Medication List    TAKE these medications       allopurinol 300 MG tablet  Commonly known as:  ZYLOPRIM  Take 300 mg by mouth daily.     diltiazem 420 MG 24 hr capsule  Commonly known as:  TIAZAC  Take 420 mg by mouth daily.     fish oil-omega-3 fatty acids 1000 MG capsule  Take 2 g by mouth daily.     ibuprofen 800 MG tablet  Commonly known as:  ADVIL,MOTRIN  Take 800 mg by mouth 2 (two) times daily.     levothyroxine 50 MCG tablet  Commonly known as:  SYNTHROID, LEVOTHROID  Take 50 mcg by mouth daily before breakfast.     olmesartan 40 MG tablet  Commonly known as:  BENICAR  Take 40 mg by mouth daily.     simvastatin 40 MG tablet  Commonly known as:  ZOCOR  Take 40 mg by mouth every evening.     tadalafil 20 MG tablet  Commonly known as:  CIALIS  Take 20 mg by mouth daily as needed for erectile dysfunction.     traMADol 50 MG tablet  Commonly known as:  ULTRAM  Take 50 mg by mouth every 8 (eight) hours as needed for pain.        SignedCristi Loron, Ahlaya Ende D. 06/01/2012, 11:17 AM

## 2012-06-01 NOTE — Telephone Encounter (Signed)
Left message on answering machine. 

## 2012-06-01 NOTE — Progress Notes (Signed)
Patient seen, examined, and I agree with the above documentation, including the assessment and plan. Ate regular breakfast, feels well. No abdominal pain. Passing gas. Wants to go home. Abdomen is benign on exam CT reassuring Discharge home now, follow-up with Dr. Jarold Motto

## 2012-06-01 NOTE — Discharge Summary (Signed)
Agree with discharge summary.

## 2012-06-01 NOTE — Progress Notes (Signed)
[facility] Progress Note  Subjective:  Feels fine.  No pain.  Is hungry.  Objective:  Vital signs in last 24 hours: Temp:  [97.2 F (36.2 C)-98.6 F (37 C)] 98.2 F (36.8 C) (04/15 0517) Pulse Rate:  [47-60] 52 (04/15 0517) Resp:  [15-25] 16 (04/15 0517) BP: (108-145)/(54-99) 128/99 mmHg (04/15 0517) SpO2:  [93 %-98 %] 96 % (04/15 0517) Weight:  [205 lb (92.987 kg)-212 lb (96.163 kg)] 205 lb (92.987 kg) (04/14 1633) Last BM Date: 05/30/12 General:   Alert, Well-developed, in NAD Heart:  Slightly bradycardic but regular rhythm; no murmurs Pulm:  CTAB.  No W/R/R. Abdomen:  Soft, non-tender and non-distended. Normal bowel sounds, without guarding, and without rebound.   Extremities:  Without edema. Neurologic:  Alert and  oriented x4;  grossly normal neurologically. Psych:  Alert and cooperative. Normal mood and affect.  Intake/Output from previous day: 04/14 0701 - 04/15 0700 In: 170 [P.O.:170] Out: 900 [Urine:900]  Lab Results:  Recent Labs  05/31/12 1636 06/01/12 0400  WBC 6.6 6.4  HGB 13.5 12.2*  HCT 38.0* 34.6*  PLT 221 184   BMET  Recent Labs  05/31/12 1636  NA 144  K 3.3*  CL 107  CO2 27  GLUCOSE 95  BUN 24*  CREATININE 0.96  CALCIUM 9.3   LFT  Recent Labs  05/31/12 1636  PROT 7.2  ALBUMIN 4.0  AST 21  ALT 25  ALKPHOS 68  BILITOT 0.4   PT/INR  Recent Labs  05/31/12 1636  LABPROT 12.8  INR 0.97   Ct Abdomen Pelvis W Contrast  05/31/2012  *RADIOLOGY REPORT*  Clinical Data: Severe abdominal pain after colonoscopy today.  CT ABDOMEN AND PELVIS WITH CONTRAST  Technique:  Multidetector CT imaging of the abdomen and pelvis was performed following the standard protocol during bolus administration of intravenous contrast.  Contrast: OMNIPAQUE IOHEXOL 300 MG/ML  SOLN  Comparison: Radiographs dated 05/31/2012  Findings: There is no free air or free fluid in the abdomen.  There is moderate air in the ascending and transverse  portions of the colon but there is no obstruction.  The amount of air in the bowel has significantly diminished since the prior abdominal radiographs.  The small bowel is normal.  Stomach appears normal.  No appreciable diverticular disease.  The liver, biliary tree, spleen, pancreas, adrenal glands, and kidneys demonstrate no significant abnormalities.  7 mm cyst in the anterior aspect of the midportion of the right kidney.  No acute osseous abnormality.  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  IMPRESSION: No acute abnormalities.  No evidence of bowel perforation or other acute abnormalities.  The amount of air in the colon has appreciably diminished since the prior exam.   Original Report Authenticated By: Francene Boyers, M.D.    Dg Abd 2 Views  05/31/2012  *RADIOLOGY REPORT*  Clinical Data: 72 year old male status post colonoscopy with abdominal pain and distention.  ABDOMEN - 2 VIEW  Comparison: Lumbar radiographs 10/29/2005.  Findings: Diffuse gas distended colon in keeping with recent colonoscopy.  Ascending and transverse colon most distended.  Gas filled small bowel loops are within normal limits.  No pneumoperitoneum evident on the upright view.  Chronic degenerative changes in the lumbar spine.  IMPRESSION: Diffuse gaseous distention of colon, maximal in the right and transverse segments, compatible with recent colonoscopy.   Original Report Authenticated By: Erskine Speed, M.D.     Assessment / Plan: #72 72 year-old male with acute severe abdominal  pain and abdominal distention post colonoscopy on 4/14.  CT imaging normal. #3 hyperlipidemia  #4 hypothyroidism  #5 issue of multiple adenomatous polyps on prior colonoscopy-no polyps on 4/14 exam  #6 left colon diverticulosis  -Will place patient on regular diet.  Likely will discharge home later this AM.    LOS: 1 day   Hyla Coard D.  06/01/2012, 8:59 AM  Pager number [phone]

## 2012-09-28 DIAGNOSIS — Z6828 Body mass index (BMI) 28.0-28.9, adult: Secondary | ICD-10-CM | POA: Diagnosis not present

## 2012-09-28 DIAGNOSIS — I10 Essential (primary) hypertension: Secondary | ICD-10-CM | POA: Diagnosis not present

## 2012-09-28 DIAGNOSIS — N529 Male erectile dysfunction, unspecified: Secondary | ICD-10-CM | POA: Diagnosis not present

## 2012-09-28 DIAGNOSIS — E1169 Type 2 diabetes mellitus with other specified complication: Secondary | ICD-10-CM | POA: Diagnosis not present

## 2012-09-28 DIAGNOSIS — N509 Disorder of male genital organs, unspecified: Secondary | ICD-10-CM | POA: Diagnosis not present

## 2012-10-21 DIAGNOSIS — Z23 Encounter for immunization: Secondary | ICD-10-CM | POA: Diagnosis not present

## 2012-12-21 DIAGNOSIS — N529 Male erectile dysfunction, unspecified: Secondary | ICD-10-CM | POA: Diagnosis not present

## 2012-12-21 DIAGNOSIS — Z125 Encounter for screening for malignant neoplasm of prostate: Secondary | ICD-10-CM | POA: Diagnosis not present

## 2012-12-21 DIAGNOSIS — M109 Gout, unspecified: Secondary | ICD-10-CM | POA: Diagnosis not present

## 2012-12-21 DIAGNOSIS — E1169 Type 2 diabetes mellitus with other specified complication: Secondary | ICD-10-CM | POA: Diagnosis not present

## 2012-12-21 DIAGNOSIS — E039 Hypothyroidism, unspecified: Secondary | ICD-10-CM | POA: Diagnosis not present

## 2012-12-21 DIAGNOSIS — E785 Hyperlipidemia, unspecified: Secondary | ICD-10-CM | POA: Diagnosis not present

## 2012-12-28 DIAGNOSIS — L989 Disorder of the skin and subcutaneous tissue, unspecified: Secondary | ICD-10-CM | POA: Diagnosis not present

## 2012-12-28 DIAGNOSIS — Z125 Encounter for screening for malignant neoplasm of prostate: Secondary | ICD-10-CM | POA: Diagnosis not present

## 2012-12-28 DIAGNOSIS — E039 Hypothyroidism, unspecified: Secondary | ICD-10-CM | POA: Diagnosis not present

## 2012-12-28 DIAGNOSIS — I10 Essential (primary) hypertension: Secondary | ICD-10-CM | POA: Diagnosis not present

## 2012-12-28 DIAGNOSIS — E785 Hyperlipidemia, unspecified: Secondary | ICD-10-CM | POA: Diagnosis not present

## 2012-12-28 DIAGNOSIS — M109 Gout, unspecified: Secondary | ICD-10-CM | POA: Diagnosis not present

## 2012-12-28 DIAGNOSIS — E1169 Type 2 diabetes mellitus with other specified complication: Secondary | ICD-10-CM | POA: Diagnosis not present

## 2012-12-28 DIAGNOSIS — Z0000 Encounter for general adult medical examination without abnormal findings: Secondary | ICD-10-CM | POA: Diagnosis not present

## 2012-12-30 DIAGNOSIS — Z1212 Encounter for screening for malignant neoplasm of rectum: Secondary | ICD-10-CM | POA: Diagnosis not present

## 2013-02-25 ENCOUNTER — Encounter (HOSPITAL_COMMUNITY): Payer: Self-pay | Admitting: Emergency Medicine

## 2013-02-25 ENCOUNTER — Emergency Department (HOSPITAL_COMMUNITY)
Admission: EM | Admit: 2013-02-25 | Discharge: 2013-02-25 | Disposition: A | Discharge status: Home / Self Care | Payer: Medicare Other | Attending: Emergency Medicine | Admitting: Emergency Medicine

## 2013-02-25 ENCOUNTER — Emergency Department (HOSPITAL_COMMUNITY): Payer: Medicare Other

## 2013-02-25 DIAGNOSIS — J029 Acute pharyngitis, unspecified: Secondary | ICD-10-CM | POA: Insufficient documentation

## 2013-02-25 DIAGNOSIS — I10 Essential (primary) hypertension: Secondary | ICD-10-CM | POA: Insufficient documentation

## 2013-02-25 DIAGNOSIS — E785 Hyperlipidemia, unspecified: Secondary | ICD-10-CM | POA: Insufficient documentation

## 2013-02-25 DIAGNOSIS — R059 Cough, unspecified: Secondary | ICD-10-CM | POA: Insufficient documentation

## 2013-02-25 DIAGNOSIS — E0590 Thyrotoxicosis, unspecified without thyrotoxic crisis or storm: Secondary | ICD-10-CM | POA: Insufficient documentation

## 2013-02-25 DIAGNOSIS — J3489 Other specified disorders of nose and nasal sinuses: Secondary | ICD-10-CM | POA: Diagnosis not present

## 2013-02-25 DIAGNOSIS — Z791 Long term (current) use of non-steroidal anti-inflammatories (NSAID): Secondary | ICD-10-CM | POA: Diagnosis not present

## 2013-02-25 DIAGNOSIS — R05 Cough: Secondary | ICD-10-CM | POA: Insufficient documentation

## 2013-02-25 DIAGNOSIS — J392 Other diseases of pharynx: Secondary | ICD-10-CM | POA: Diagnosis not present

## 2013-02-25 DIAGNOSIS — R0602 Shortness of breath: Secondary | ICD-10-CM | POA: Insufficient documentation

## 2013-02-25 DIAGNOSIS — R0989 Other specified symptoms and signs involving the circulatory and respiratory systems: Secondary | ICD-10-CM | POA: Diagnosis not present

## 2013-02-25 DIAGNOSIS — R6889 Other general symptoms and signs: Secondary | ICD-10-CM

## 2013-02-25 DIAGNOSIS — M109 Gout, unspecified: Secondary | ICD-10-CM | POA: Insufficient documentation

## 2013-02-25 DIAGNOSIS — Z79899 Other long term (current) drug therapy: Secondary | ICD-10-CM | POA: Insufficient documentation

## 2013-02-25 DIAGNOSIS — Z860100 Personal history of colon polyps, unspecified: Secondary | ICD-10-CM | POA: Insufficient documentation

## 2013-02-25 NOTE — ED Notes (Signed)
Per pt report: pt has had a cold for the past 10 days.  Pt is finishing a z-pack.  Pt reports the sensation of phlem in throat that is making pt feel like his throat is closing in on him.  Pt is able to speak in complete sentences without difficulty. Pt a/o x 4.  Skin warm and dry.

## 2013-02-25 NOTE — ED Provider Notes (Signed)
CSN: [id number]     Arrival date & time 02/25/13  0158 History   First MD Initiated Contact with Patient 02/25/13 0258     Chief Complaint  Patient presents with  . throat closing up    (Consider location/radiation/quality/duration/timing/severity/associated sxs/prior Treatment) HPI Patient has had "cold" for the past 10 days. He's had cough with sputum production that is yellow. He's been seen by his primary Dr. and prescribed a Z-Pak. He is having no fevers or chills. He states he is feeling much better. He is taking a cough expectorant. States he was lying down this evening and he felt that he had mucous gathering in his throat making it difficult for him to breathe. Since that time his cough is produced quite a bit of mucus with clearing of his throat. He is having no shortness of breath at this time. He states he is feeling much better. He denies any chest pain in the point. He denies any wheezing. He has no lower sugary swelling or pain. Past Medical History  Diagnosis Date  . Colon polyps     Tubular Adenoma   . Hyperthyroidism   . Hypertension   . Borderline diabetes   . Hyperlipidemia   . Gout    Past Surgical History  Procedure Laterality Date  . Vasectomy    . Back surgery    . Mole removal    . Biceps tendon repair     Family History  Problem Relation Age of Onset  . Diabetes Mother   . Colon cancer Brother     20's  . Diabetes Brother    History  Substance Use Topics  . Smoking status: Never Smoker   . Smokeless tobacco: Never Used  . Alcohol Use: No    Review of Systems  Constitutional: Negative for fever, chills and fatigue.  HENT: Positive for congestion and sore throat. Negative for trouble swallowing.   Respiratory: Positive for cough and shortness of breath. Negative for chest tightness and wheezing.   Cardiovascular: Negative for chest pain, palpitations and leg swelling.  Gastrointestinal: Negative for nausea, vomiting, abdominal pain, diarrhea and  constipation.  Musculoskeletal: Negative for back pain, myalgias, neck pain and neck stiffness.  Skin: Negative for rash and wound.  Neurological: Negative for dizziness, weakness, light-headedness, numbness and headaches.  All other systems reviewed and are negative.    Allergies  Valsartan-hydrochlorothiazide  Home Medications   Current Outpatient Rx  Name  Route  Sig  Dispense  Refill  . allopurinol (ZYLOPRIM) 300 MG tablet   Oral   Take 300 mg by mouth daily.         Marland Kitchen diltiazem (TIAZAC) 420 MG 24 hr capsule   Oral   Take 420 mg by mouth daily.         . fish oil-omega-3 fatty acids 1000 MG capsule   Oral   Take 2 g by mouth daily.         Marland Kitchen ibuprofen (ADVIL,MOTRIN) 800 MG tablet   Oral   Take 800 mg by mouth 2 (two) times daily. scheduled         . levothyroxine (SYNTHROID, LEVOTHROID) 50 MCG tablet   Oral   Take 50 mcg by mouth daily before breakfast.         . olmesartan (BENICAR) 40 MG tablet   Oral   Take 40 mg by mouth daily.         . simvastatin (ZOCOR) 40 MG tablet   Oral  Take 40 mg by mouth every evening.          BP 167/78  Pulse 50  Temp(Src) 97.6 F (36.4 C) (Oral)  Resp 20  SpO2 94% Physical Exam  Nursing note and vitals reviewed. Constitutional: He is oriented to person, place, and time. He appears well-developed and well-nourished. No distress.  HENT:  Head: Normocephalic and atraumatic.  Mouth/Throat: Oropharynx is clear and moist. No oropharyngeal exudate.  Eyes: EOM are normal. Pupils are equal, round, and reactive to light.  Neck: Normal range of motion. Neck supple. No tracheal deviation present.  Cardiovascular: Normal rate and regular rhythm.  Exam reveals no gallop and no friction rub.   No murmur heard. Pulmonary/Chest: Effort normal and breath sounds normal. No stridor. No respiratory distress. He has no wheezes. He has no rales. He exhibits no tenderness.  Abdominal: Soft. Bowel sounds are normal. He exhibits  no distension and no mass. There is no tenderness. There is no rebound and no guarding.  Musculoskeletal: Normal range of motion. He exhibits no edema and no tenderness.  No lower extremity edema or tenderness.  Lymphadenopathy:    He has no cervical adenopathy.  Neurological: He is alert and oriented to person, place, and time.  Patient is alert and oriented x3 with clear, goal oriented speech. Patient has 5/5 motor in all extremities. Sensation is intact to light touch.  Skin: Skin is warm and dry. No rash noted. No erythema.  Psychiatric: He has a normal mood and affect. His behavior is normal.    ED Course  Procedures (including critical care time) Labs Review Labs Reviewed - No data to display Imaging Review No results found.  EKG Interpretation   None       MDM   Patient remained stable in the emergency department. X-ray without acute findings. Patient continues to have no difficulty breathing.  Julianne Rice, MD 02/25/13 [phone]

## 2013-02-25 NOTE — Discharge Instructions (Signed)
Return immediately to the emergency department for any further difficulty breathing. Continue take medications as prescribed.

## 2013-03-21 DIAGNOSIS — R059 Cough, unspecified: Secondary | ICD-10-CM | POA: Diagnosis not present

## 2013-03-21 DIAGNOSIS — Z6829 Body mass index (BMI) 29.0-29.9, adult: Secondary | ICD-10-CM | POA: Diagnosis not present

## 2013-03-21 DIAGNOSIS — M5412 Radiculopathy, cervical region: Secondary | ICD-10-CM | POA: Diagnosis not present

## 2013-03-21 DIAGNOSIS — R05 Cough: Secondary | ICD-10-CM | POA: Diagnosis not present

## 2013-04-01 ENCOUNTER — Other Ambulatory Visit: Payer: Self-pay | Admitting: Dermatology

## 2013-04-01 DIAGNOSIS — L570 Actinic keratosis: Secondary | ICD-10-CM | POA: Diagnosis not present

## 2013-04-01 DIAGNOSIS — D2370 Other benign neoplasm of skin of unspecified lower limb, including hip: Secondary | ICD-10-CM | POA: Diagnosis not present

## 2013-04-01 DIAGNOSIS — L819 Disorder of pigmentation, unspecified: Secondary | ICD-10-CM | POA: Diagnosis not present

## 2013-04-01 DIAGNOSIS — D1801 Hemangioma of skin and subcutaneous tissue: Secondary | ICD-10-CM | POA: Diagnosis not present

## 2013-04-01 DIAGNOSIS — Z85828 Personal history of other malignant neoplasm of skin: Secondary | ICD-10-CM | POA: Diagnosis not present

## 2013-04-01 DIAGNOSIS — C44319 Basal cell carcinoma of skin of other parts of face: Secondary | ICD-10-CM | POA: Diagnosis not present

## 2013-04-01 DIAGNOSIS — L821 Other seborrheic keratosis: Secondary | ICD-10-CM | POA: Diagnosis not present

## 2013-04-01 DIAGNOSIS — L219 Seborrheic dermatitis, unspecified: Secondary | ICD-10-CM | POA: Diagnosis not present

## 2013-04-01 DIAGNOSIS — D485 Neoplasm of uncertain behavior of skin: Secondary | ICD-10-CM | POA: Diagnosis not present

## 2013-04-20 DIAGNOSIS — Z6829 Body mass index (BMI) 29.0-29.9, adult: Secondary | ICD-10-CM | POA: Diagnosis not present

## 2013-04-20 DIAGNOSIS — M5412 Radiculopathy, cervical region: Secondary | ICD-10-CM | POA: Diagnosis not present

## 2013-04-20 DIAGNOSIS — Z1331 Encounter for screening for depression: Secondary | ICD-10-CM | POA: Diagnosis not present

## 2013-04-20 DIAGNOSIS — E1169 Type 2 diabetes mellitus with other specified complication: Secondary | ICD-10-CM | POA: Diagnosis not present

## 2013-04-20 DIAGNOSIS — I10 Essential (primary) hypertension: Secondary | ICD-10-CM | POA: Diagnosis not present

## 2013-04-29 DIAGNOSIS — M5030 Other cervical disc degeneration, unspecified cervical region: Secondary | ICD-10-CM | POA: Diagnosis not present

## 2013-05-11 DIAGNOSIS — C44319 Basal cell carcinoma of skin of other parts of face: Secondary | ICD-10-CM | POA: Diagnosis not present

## 2013-05-11 DIAGNOSIS — Z85828 Personal history of other malignant neoplasm of skin: Secondary | ICD-10-CM | POA: Diagnosis not present

## 2013-05-16 DIAGNOSIS — I10 Essential (primary) hypertension: Secondary | ICD-10-CM | POA: Diagnosis not present

## 2013-05-16 DIAGNOSIS — M5030 Other cervical disc degeneration, unspecified cervical region: Secondary | ICD-10-CM | POA: Diagnosis not present

## 2013-05-24 DIAGNOSIS — M5030 Other cervical disc degeneration, unspecified cervical region: Secondary | ICD-10-CM | POA: Diagnosis not present

## 2013-05-31 DIAGNOSIS — M5137 Other intervertebral disc degeneration, lumbosacral region: Secondary | ICD-10-CM | POA: Diagnosis not present

## 2013-06-01 ENCOUNTER — Other Ambulatory Visit: Payer: Self-pay | Admitting: Orthopedic Surgery

## 2013-06-01 DIAGNOSIS — M79605 Pain in left leg: Secondary | ICD-10-CM

## 2013-06-06 DIAGNOSIS — M5137 Other intervertebral disc degeneration, lumbosacral region: Secondary | ICD-10-CM | POA: Diagnosis not present

## 2013-06-07 DIAGNOSIS — M5430 Sciatica, unspecified side: Secondary | ICD-10-CM | POA: Diagnosis not present

## 2013-06-07 DIAGNOSIS — G8929 Other chronic pain: Secondary | ICD-10-CM | POA: Diagnosis not present

## 2013-06-07 DIAGNOSIS — R42 Dizziness and giddiness: Secondary | ICD-10-CM | POA: Diagnosis not present

## 2013-06-07 DIAGNOSIS — R0602 Shortness of breath: Secondary | ICD-10-CM | POA: Diagnosis not present

## 2013-06-07 DIAGNOSIS — N179 Acute kidney failure, unspecified: Secondary | ICD-10-CM | POA: Diagnosis not present

## 2013-06-07 DIAGNOSIS — Z79899 Other long term (current) drug therapy: Secondary | ICD-10-CM | POA: Diagnosis not present

## 2013-06-07 DIAGNOSIS — E039 Hypothyroidism, unspecified: Secondary | ICD-10-CM | POA: Diagnosis not present

## 2013-06-07 DIAGNOSIS — R61 Generalized hyperhidrosis: Secondary | ICD-10-CM | POA: Diagnosis not present

## 2013-06-07 DIAGNOSIS — E860 Dehydration: Secondary | ICD-10-CM | POA: Diagnosis not present

## 2013-06-07 DIAGNOSIS — M109 Gout, unspecified: Secondary | ICD-10-CM | POA: Diagnosis not present

## 2013-06-07 DIAGNOSIS — R6889 Other general symptoms and signs: Secondary | ICD-10-CM | POA: Diagnosis not present

## 2013-06-07 DIAGNOSIS — I10 Essential (primary) hypertension: Secondary | ICD-10-CM | POA: Diagnosis not present

## 2013-06-08 DIAGNOSIS — I10 Essential (primary) hypertension: Secondary | ICD-10-CM | POA: Diagnosis not present

## 2013-06-08 DIAGNOSIS — G8929 Other chronic pain: Secondary | ICD-10-CM | POA: Diagnosis not present

## 2013-06-08 DIAGNOSIS — N179 Acute kidney failure, unspecified: Secondary | ICD-10-CM | POA: Diagnosis not present

## 2013-06-08 DIAGNOSIS — E039 Hypothyroidism, unspecified: Secondary | ICD-10-CM | POA: Diagnosis not present

## 2013-06-13 ENCOUNTER — Other Ambulatory Visit: Payer: Self-pay | Admitting: Orthopedic Surgery

## 2013-06-13 ENCOUNTER — Ambulatory Visit
Admission: RE | Admit: 2013-06-13 | Discharge: 2013-06-13 | Disposition: A | Discharge status: Home / Self Care | Payer: Self-pay | Source: Ambulatory Visit | Attending: Orthopedic Surgery | Admitting: Orthopedic Surgery

## 2013-06-13 ENCOUNTER — Ambulatory Visit
Admission: RE | Admit: 2013-06-13 | Discharge: 2013-06-13 | Disposition: A | Discharge status: Home / Self Care | Payer: Medicare Other | Source: Ambulatory Visit | Attending: Orthopedic Surgery | Admitting: Orthopedic Surgery

## 2013-06-13 VITALS — BP 68/35 | HR 51

## 2013-06-13 DIAGNOSIS — M5126 Other intervertebral disc displacement, lumbar region: Secondary | ICD-10-CM | POA: Diagnosis not present

## 2013-06-13 DIAGNOSIS — M5137 Other intervertebral disc degeneration, lumbosacral region: Secondary | ICD-10-CM

## 2013-06-13 DIAGNOSIS — M79605 Pain in left leg: Secondary | ICD-10-CM

## 2013-06-13 DIAGNOSIS — M48061 Spinal stenosis, lumbar region without neurogenic claudication: Secondary | ICD-10-CM | POA: Diagnosis not present

## 2013-06-13 MED ORDER — IOHEXOL 180 MG/ML  SOLN
15.0000 mL | Freq: Once | INTRAMUSCULAR | Status: AC | PRN
  Administered 2013-06-13: 15 mL via INTRATHECAL

## 2013-06-13 MED ORDER — ONDANSETRON HCL 4 MG/2ML IJ SOLN
4.0000 mg | Freq: Once | INTRAMUSCULAR | Status: AC
  Administered 2013-06-13: 4 mg via INTRAMUSCULAR

## 2013-06-13 MED ORDER — MEPERIDINE HCL 100 MG/ML IJ SOLN
75.0000 mg | Freq: Once | INTRAMUSCULAR | Status: AC
  Administered 2013-06-13: 75 mg via INTRAMUSCULAR

## 2013-06-13 MED ORDER — DIAZEPAM 5 MG PO TABS
5.0000 mg | ORAL_TABLET | Freq: Once | ORAL | Status: AC
  Administered 2013-06-13: 5 mg via ORAL

## 2013-06-13 NOTE — Discharge Instructions (Signed)

## 2013-07-22 DIAGNOSIS — Z6828 Body mass index (BMI) 28.0-28.9, adult: Secondary | ICD-10-CM | POA: Diagnosis not present

## 2013-07-22 DIAGNOSIS — E119 Type 2 diabetes mellitus without complications: Secondary | ICD-10-CM | POA: Diagnosis not present

## 2013-07-22 DIAGNOSIS — M48061 Spinal stenosis, lumbar region without neurogenic claudication: Secondary | ICD-10-CM | POA: Diagnosis not present

## 2013-07-22 DIAGNOSIS — I10 Essential (primary) hypertension: Secondary | ICD-10-CM | POA: Diagnosis not present

## 2013-07-22 DIAGNOSIS — E039 Hypothyroidism, unspecified: Secondary | ICD-10-CM | POA: Diagnosis not present

## 2013-07-22 DIAGNOSIS — M5412 Radiculopathy, cervical region: Secondary | ICD-10-CM | POA: Diagnosis not present

## 2013-11-22 DIAGNOSIS — Z23 Encounter for immunization: Secondary | ICD-10-CM | POA: Diagnosis not present

## 2014-01-02 DIAGNOSIS — E785 Hyperlipidemia, unspecified: Secondary | ICD-10-CM | POA: Diagnosis not present

## 2014-01-02 DIAGNOSIS — M109 Gout, unspecified: Secondary | ICD-10-CM | POA: Diagnosis not present

## 2014-01-02 DIAGNOSIS — E119 Type 2 diabetes mellitus without complications: Secondary | ICD-10-CM | POA: Diagnosis not present

## 2014-01-02 DIAGNOSIS — Z125 Encounter for screening for malignant neoplasm of prostate: Secondary | ICD-10-CM | POA: Diagnosis not present

## 2014-01-02 DIAGNOSIS — E039 Hypothyroidism, unspecified: Secondary | ICD-10-CM | POA: Diagnosis not present

## 2014-01-02 DIAGNOSIS — I10 Essential (primary) hypertension: Secondary | ICD-10-CM | POA: Diagnosis not present

## 2014-01-06 DIAGNOSIS — M109 Gout, unspecified: Secondary | ICD-10-CM | POA: Diagnosis not present

## 2014-01-06 DIAGNOSIS — K5790 Diverticulosis of intestine, part unspecified, without perforation or abscess without bleeding: Secondary | ICD-10-CM | POA: Diagnosis not present

## 2014-01-06 DIAGNOSIS — I10 Essential (primary) hypertension: Secondary | ICD-10-CM | POA: Diagnosis not present

## 2014-01-06 DIAGNOSIS — Z23 Encounter for immunization: Secondary | ICD-10-CM | POA: Diagnosis not present

## 2014-01-06 DIAGNOSIS — E119 Type 2 diabetes mellitus without complications: Secondary | ICD-10-CM | POA: Diagnosis not present

## 2014-01-06 DIAGNOSIS — E785 Hyperlipidemia, unspecified: Secondary | ICD-10-CM | POA: Diagnosis not present

## 2014-01-06 DIAGNOSIS — N401 Enlarged prostate with lower urinary tract symptoms: Secondary | ICD-10-CM | POA: Diagnosis not present

## 2014-01-06 DIAGNOSIS — M19049 Primary osteoarthritis, unspecified hand: Secondary | ICD-10-CM | POA: Diagnosis not present

## 2014-01-06 DIAGNOSIS — Z0000 Encounter for general adult medical examination without abnormal findings: Secondary | ICD-10-CM | POA: Diagnosis not present

## 2014-01-30 DIAGNOSIS — G5622 Lesion of ulnar nerve, left upper limb: Secondary | ICD-10-CM | POA: Diagnosis not present

## 2014-02-05 DIAGNOSIS — E079 Disorder of thyroid, unspecified: Secondary | ICD-10-CM | POA: Diagnosis not present

## 2014-02-05 DIAGNOSIS — I10 Essential (primary) hypertension: Secondary | ICD-10-CM | POA: Diagnosis not present

## 2014-02-05 DIAGNOSIS — Z79899 Other long term (current) drug therapy: Secondary | ICD-10-CM | POA: Diagnosis not present

## 2014-02-05 DIAGNOSIS — T18120A Food in esophagus causing compression of trachea, initial encounter: Secondary | ICD-10-CM | POA: Diagnosis not present

## 2014-02-05 DIAGNOSIS — T17228A Food in pharynx causing other injury, initial encounter: Secondary | ICD-10-CM | POA: Diagnosis not present

## 2014-04-28 DIAGNOSIS — L918 Other hypertrophic disorders of the skin: Secondary | ICD-10-CM | POA: Diagnosis not present

## 2014-04-28 DIAGNOSIS — Z85828 Personal history of other malignant neoplasm of skin: Secondary | ICD-10-CM | POA: Diagnosis not present

## 2014-04-28 DIAGNOSIS — L821 Other seborrheic keratosis: Secondary | ICD-10-CM | POA: Diagnosis not present

## 2014-04-28 DIAGNOSIS — L814 Other melanin hyperpigmentation: Secondary | ICD-10-CM | POA: Diagnosis not present

## 2014-05-09 DIAGNOSIS — M4806 Spinal stenosis, lumbar region: Secondary | ICD-10-CM | POA: Diagnosis not present

## 2014-05-09 DIAGNOSIS — N400 Enlarged prostate without lower urinary tract symptoms: Secondary | ICD-10-CM | POA: Diagnosis not present

## 2014-05-09 DIAGNOSIS — E119 Type 2 diabetes mellitus without complications: Secondary | ICD-10-CM | POA: Diagnosis not present

## 2014-05-09 DIAGNOSIS — Z6829 Body mass index (BMI) 29.0-29.9, adult: Secondary | ICD-10-CM | POA: Diagnosis not present

## 2014-05-09 DIAGNOSIS — Z1389 Encounter for screening for other disorder: Secondary | ICD-10-CM | POA: Diagnosis not present

## 2014-05-09 DIAGNOSIS — I10 Essential (primary) hypertension: Secondary | ICD-10-CM | POA: Diagnosis not present

## 2014-09-20 DIAGNOSIS — I10 Essential (primary) hypertension: Secondary | ICD-10-CM | POA: Diagnosis not present

## 2014-09-20 DIAGNOSIS — L309 Dermatitis, unspecified: Secondary | ICD-10-CM | POA: Diagnosis not present

## 2014-09-20 DIAGNOSIS — M109 Gout, unspecified: Secondary | ICD-10-CM | POA: Diagnosis not present

## 2014-09-20 DIAGNOSIS — Z6829 Body mass index (BMI) 29.0-29.9, adult: Secondary | ICD-10-CM | POA: Diagnosis not present

## 2014-09-20 DIAGNOSIS — E119 Type 2 diabetes mellitus without complications: Secondary | ICD-10-CM | POA: Diagnosis not present

## 2014-09-20 DIAGNOSIS — E785 Hyperlipidemia, unspecified: Secondary | ICD-10-CM | POA: Diagnosis not present

## 2014-09-20 DIAGNOSIS — N401 Enlarged prostate with lower urinary tract symptoms: Secondary | ICD-10-CM | POA: Diagnosis not present

## 2014-11-18 DIAGNOSIS — Z23 Encounter for immunization: Secondary | ICD-10-CM | POA: Diagnosis not present

## 2015-01-23 DIAGNOSIS — I10 Essential (primary) hypertension: Secondary | ICD-10-CM | POA: Diagnosis not present

## 2015-01-23 DIAGNOSIS — E784 Other hyperlipidemia: Secondary | ICD-10-CM | POA: Diagnosis not present

## 2015-01-23 DIAGNOSIS — E038 Other specified hypothyroidism: Secondary | ICD-10-CM | POA: Diagnosis not present

## 2015-01-23 DIAGNOSIS — E119 Type 2 diabetes mellitus without complications: Secondary | ICD-10-CM | POA: Diagnosis not present

## 2015-01-23 DIAGNOSIS — M109 Gout, unspecified: Secondary | ICD-10-CM | POA: Diagnosis not present

## 2015-01-23 DIAGNOSIS — Z125 Encounter for screening for malignant neoplasm of prostate: Secondary | ICD-10-CM | POA: Diagnosis not present

## 2015-01-30 DIAGNOSIS — E038 Other specified hypothyroidism: Secondary | ICD-10-CM | POA: Diagnosis not present

## 2015-01-30 DIAGNOSIS — E1129 Type 2 diabetes mellitus with other diabetic kidney complication: Secondary | ICD-10-CM | POA: Diagnosis not present

## 2015-01-30 DIAGNOSIS — M19049 Primary osteoarthritis, unspecified hand: Secondary | ICD-10-CM | POA: Diagnosis not present

## 2015-01-30 DIAGNOSIS — K5790 Diverticulosis of intestine, part unspecified, without perforation or abscess without bleeding: Secondary | ICD-10-CM | POA: Diagnosis not present

## 2015-01-30 DIAGNOSIS — I10 Essential (primary) hypertension: Secondary | ICD-10-CM | POA: Diagnosis not present

## 2015-01-30 DIAGNOSIS — Z0000 Encounter for general adult medical examination without abnormal findings: Secondary | ICD-10-CM | POA: Diagnosis not present

## 2015-01-30 DIAGNOSIS — Z1389 Encounter for screening for other disorder: Secondary | ICD-10-CM | POA: Diagnosis not present

## 2015-01-30 DIAGNOSIS — M4806 Spinal stenosis, lumbar region: Secondary | ICD-10-CM | POA: Diagnosis not present

## 2015-01-30 DIAGNOSIS — N183 Chronic kidney disease, stage 3 (moderate): Secondary | ICD-10-CM | POA: Diagnosis not present

## 2015-01-30 DIAGNOSIS — E784 Other hyperlipidemia: Secondary | ICD-10-CM | POA: Diagnosis not present

## 2015-01-30 DIAGNOSIS — M109 Gout, unspecified: Secondary | ICD-10-CM | POA: Diagnosis not present

## 2015-01-30 DIAGNOSIS — Z6829 Body mass index (BMI) 29.0-29.9, adult: Secondary | ICD-10-CM | POA: Diagnosis not present

## 2015-04-17 DIAGNOSIS — M545 Low back pain: Secondary | ICD-10-CM | POA: Diagnosis not present

## 2015-05-07 DIAGNOSIS — M545 Low back pain: Secondary | ICD-10-CM | POA: Diagnosis not present

## 2015-06-05 DIAGNOSIS — N401 Enlarged prostate with lower urinary tract symptoms: Secondary | ICD-10-CM | POA: Diagnosis not present

## 2015-06-05 DIAGNOSIS — E119 Type 2 diabetes mellitus without complications: Secondary | ICD-10-CM | POA: Diagnosis not present

## 2015-06-05 DIAGNOSIS — I10 Essential (primary) hypertension: Secondary | ICD-10-CM | POA: Diagnosis not present

## 2015-06-05 DIAGNOSIS — N183 Chronic kidney disease, stage 3 (moderate): Secondary | ICD-10-CM | POA: Diagnosis not present

## 2015-06-05 DIAGNOSIS — M4806 Spinal stenosis, lumbar region: Secondary | ICD-10-CM | POA: Diagnosis not present

## 2015-06-05 DIAGNOSIS — Z6829 Body mass index (BMI) 29.0-29.9, adult: Secondary | ICD-10-CM | POA: Diagnosis not present

## 2015-06-05 DIAGNOSIS — Z1389 Encounter for screening for other disorder: Secondary | ICD-10-CM | POA: Diagnosis not present

## 2015-06-14 ENCOUNTER — Encounter (HOSPITAL_COMMUNITY): Payer: Self-pay | Admitting: Emergency Medicine

## 2015-06-14 ENCOUNTER — Emergency Department (HOSPITAL_COMMUNITY)
Admission: EM | Admit: 2015-06-14 | Discharge: 2015-06-15 | Disposition: A | Discharge status: Home / Self Care | Payer: Medicare Other | Attending: Emergency Medicine | Admitting: Emergency Medicine

## 2015-06-14 ENCOUNTER — Emergency Department (HOSPITAL_COMMUNITY): Payer: Medicare Other

## 2015-06-14 DIAGNOSIS — Z86010 Personal history of colonic polyps: Secondary | ICD-10-CM | POA: Insufficient documentation

## 2015-06-14 DIAGNOSIS — M19041 Primary osteoarthritis, right hand: Secondary | ICD-10-CM | POA: Insufficient documentation

## 2015-06-14 DIAGNOSIS — R070 Pain in throat: Secondary | ICD-10-CM | POA: Diagnosis not present

## 2015-06-14 DIAGNOSIS — T50905A Adverse effect of unspecified drugs, medicaments and biological substances, initial encounter: Secondary | ICD-10-CM

## 2015-06-14 DIAGNOSIS — E785 Hyperlipidemia, unspecified: Secondary | ICD-10-CM | POA: Diagnosis not present

## 2015-06-14 DIAGNOSIS — Z79899 Other long term (current) drug therapy: Secondary | ICD-10-CM | POA: Diagnosis not present

## 2015-06-14 DIAGNOSIS — E0590 Thyrotoxicosis, unspecified without thyrotoxic crisis or storm: Secondary | ICD-10-CM | POA: Diagnosis not present

## 2015-06-14 DIAGNOSIS — M1990 Unspecified osteoarthritis, unspecified site: Secondary | ICD-10-CM | POA: Diagnosis not present

## 2015-06-14 DIAGNOSIS — I10 Essential (primary) hypertension: Secondary | ICD-10-CM | POA: Insufficient documentation

## 2015-06-14 DIAGNOSIS — M19042 Primary osteoarthritis, left hand: Secondary | ICD-10-CM | POA: Diagnosis not present

## 2015-06-14 DIAGNOSIS — K219 Gastro-esophageal reflux disease without esophagitis: Secondary | ICD-10-CM | POA: Diagnosis not present

## 2015-06-14 DIAGNOSIS — M479 Spondylosis, unspecified: Secondary | ICD-10-CM | POA: Diagnosis not present

## 2015-06-14 DIAGNOSIS — Z791 Long term (current) use of non-steroidal anti-inflammatories (NSAID): Secondary | ICD-10-CM | POA: Diagnosis not present

## 2015-06-14 DIAGNOSIS — R0989 Other specified symptoms and signs involving the circulatory and respiratory systems: Secondary | ICD-10-CM | POA: Diagnosis not present

## 2015-06-14 DIAGNOSIS — R05 Cough: Secondary | ICD-10-CM | POA: Diagnosis present

## 2015-06-14 DIAGNOSIS — K208 Other esophagitis without bleeding: Secondary | ICD-10-CM

## 2015-06-14 DIAGNOSIS — Z86018 Personal history of other benign neoplasm: Secondary | ICD-10-CM | POA: Diagnosis not present

## 2015-06-14 MED ORDER — ONDANSETRON 4 MG PO TBDP
4.0000 mg | ORAL_TABLET | Freq: Once | ORAL | Status: AC
  Administered 2015-06-14: 4 mg via ORAL
  Filled 2015-06-14: qty 1

## 2015-06-14 MED ORDER — GLUCAGON HCL RDNA (DIAGNOSTIC) 1 MG IJ SOLR
1.0000 mg | Freq: Once | INTRAMUSCULAR | Status: AC
  Administered 2015-06-14: 1 mg via INTRAMUSCULAR
  Filled 2015-06-14: qty 1

## 2015-06-14 MED ORDER — NITROGLYCERIN 0.4 MG SL SUBL: 0.4000 mg | SUBLINGUAL_TABLET | SUBLINGUAL | Status: DC | PRN

## 2015-06-14 NOTE — ED Provider Notes (Signed)
CSN: [id number]     Arrival date & time 06/14/15  2013 History  By signing my name below, I, [facility], attest that this documentation has been prepared under the direction and in the presence of Varney Biles, MD. Electronically Signed: Virgel Bouquet, ED Scribe. 06/14/2015. 6:51 PM.   No chief complaint on file.  The history is provided by the patient. No language interpreter was used.  HPI Comments: Zachary Mcdonald is a 75 y.o. male who presents to the Emergency Department complaining of a possible foreign body stuck in his throat that occurred last night. Patient states that he took a Tramadol last night followed by gradual onset of a object feeling like it is lodged in his throat. He reports burping, coughing, post-tussive emesis, and sore chest discomfort secondary to cough. He has been able to eat and drink normally despite discomfort. He has tried drinking both hot and cold fluids without relief. He notes similar symptoms 2 years ago that he was evaluated at another ED but resolved without treatment. Per patient, he had a colonoscopy 3 years ago at [facility]. Denies previous endoscopy. Denies voice changes, difficulty swallowing, drooling, SOB, CP.  Past Medical History  Diagnosis Date  . Colon polyps     Tubular Adenoma   . Hyperthyroidism   . Hypertension   . Borderline diabetes   . Hyperlipidemia   . Gout   . Arthritis     spine and hands  . GERD (gastroesophageal reflux disease)    Past Surgical History  Procedure Laterality Date  . Vasectomy  1978  . Back surgery  2010  . Mole removal      face  . Biceps tendon repair Right 1999   Family History  Problem Relation Age of Onset  . Diabetes Mother   . Colon cancer Brother     63's  . Diabetes Brother    Social History  Substance Use Topics  . Smoking status: Never Smoker   . Smokeless tobacco: Never Used  . Alcohol Use: No    Review of Systems A complete 10 system review of systems was obtained  and all systems are negative except as noted in the HPI and PMH.   Allergies  Oxycontin and Valsartan-hydrochlorothiazide  Home Medications   Prior to Admission medications   Medication Sig Start Date End Date Taking? Authorizing Provider  allopurinol (ZYLOPRIM) 300 MG tablet Take 300 mg by mouth daily.   Yes Historical Provider, MD  diltiazem (TIAZAC) 420 MG 24 hr capsule Take 420 mg by mouth daily.   Yes Historical Provider, MD  fish oil-omega-3 fatty acids 1000 MG capsule Take 2 g by mouth daily.   Yes Historical Provider, MD  ibuprofen (ADVIL,MOTRIN) 800 MG tablet Take 800 mg by mouth 2 (two) times daily. scheduled   Yes Historical Provider, MD  levothyroxine (SYNTHROID, LEVOTHROID) 50 MCG tablet Take 50 mcg by mouth daily before breakfast.   Yes Historical Provider, MD  olmesartan (BENICAR) 40 MG tablet Take 40 mg by mouth daily.   Yes Historical Provider, MD  simvastatin (ZOCOR) 40 MG tablet Take 40 mg by mouth every evening.   Yes Historical Provider, MD  tamsulosin (FLOMAX) 0.4 MG CAPS capsule Take 0.4 mg by mouth daily.  04/05/15  Yes Historical Provider, MD  traMADol (ULTRAM) 50 MG tablet Take 50 mg by mouth every 6 (six) hours as needed for moderate pain or severe pain.   Yes Historical Provider, MD  omeprazole (PRILOSEC) 20 MG capsule Take 1  capsule (20 mg total) by mouth daily. Patient not taking: Reported on 06/15/2015 06/15/15   Varney Biles, MD   BP 141/64 mmHg  Pulse 86  Temp(Src) 98.1 F (36.7 C) (Oral)  Resp 18  SpO2 94% Physical Exam  Constitutional: He is oriented to person, place, and time. He appears well-developed and well-nourished. No distress.  HENT:  Head: Normocephalic and atraumatic.  No thyromegaly noted.  Eyes: Conjunctivae are normal.  Neck: Normal range of motion.  Cardiovascular: Normal rate and regular rhythm.   Pulmonary/Chest: Effort normal. No respiratory distress.  Lungs CTA. No stridor.  Musculoskeletal: Normal range of motion.   Neurological: He is alert and oriented to person, place, and time.  Skin: Skin is warm and dry.  Psychiatric: He has a normal mood and affect. His behavior is normal.  Nursing note and vitals reviewed.   ED Course  Procedures   DIAGNOSTIC STUDIES: Oxygen Saturation is 92% on RA, low by my interpretation.    COORDINATION OF CARE: 11:10 PM Will order chest x-ray, Zofran, pain medication injection. Discussed treatment plan with pt at bedside and pt agreed to plan.  12:02 AM Return to re-evaluate pt. Discussed results of chest x-ray. Pt has improved. Advised pt follow-up with GI specialist. Will discharge home.  Imaging Review Dg Chest 1 View  06/14/2015  CLINICAL DATA:  Food impaction with sensation in throat. EXAM: CHEST 1 VIEW COMPARISON:  11/17/2005 FINDINGS: Normal heart size and mediastinal contours when accounting for presumed apical fat pad. No acute infiltrate or edema. No effusion or pneumothorax. No acute osseous findings. IMPRESSION: Negative chest. Electronically Signed   By: Monte Fantasia M.D.   On: 06/14/2015 23:42   I have personally reviewed and evaluated these images as part of my medical decision-making.   MDM   Final diagnoses:  Throat discomfort  Pill esophagitis    I personally performed the services described in this documentation, which was scribed in my presence. The recorded information has been reviewed and is accurate.   Concerns for pill esophagitis. PO intake not affected. ? Other structural problem to the esophagus. Pt given glucagon im, nitro SL - and he feels a lot better. Will d/c with GI f/u requested.   Varney Biles, MD 06/15/15 [phone]

## 2015-06-14 NOTE — ED Notes (Signed)
Patient reports eating dinner last night (had stew beef), felt something was lodged in throat, able to swallow own secretions, able to pass fluids, tolerating eating also. Patient reports emesis induced by coughing today. Denies difficulty swallowing, denies SOB, denies pain.

## 2015-06-15 ENCOUNTER — Telehealth: Payer: Self-pay | Admitting: Internal Medicine

## 2015-06-15 ENCOUNTER — Ambulatory Visit (AMBULATORY_SURGERY_CENTER): Payer: Self-pay | Admitting: *Deleted

## 2015-06-15 VITALS — Ht 73.0 in | Wt 214.2 lb

## 2015-06-15 DIAGNOSIS — R1310 Dysphagia, unspecified: Secondary | ICD-10-CM

## 2015-06-15 MED ORDER — OMEPRAZOLE 20 MG PO CPDR: 20.0000 mg | DELAYED_RELEASE_CAPSULE | Freq: Every day | ORAL | Status: DC

## 2015-06-15 NOTE — Progress Notes (Signed)
No allergies to eggs or soy. No problems with anesthesia.  Pt given Emmi instructions for egd  No oxygen use  No diet drug use  

## 2015-06-15 NOTE — Telephone Encounter (Signed)
Seen in ED overnight w/ sxs of pill/food impaction Better after medical Tx - glucagon Needs close f/u from [place] - sees Dr. Hilarie Fredrickson ? Needs OV vs being put on for EGD semi-direct

## 2015-06-15 NOTE — Telephone Encounter (Signed)
I have spoken to patient and have scheduled him for Monday, 06/18/15 @ 2:30 pm in [place]. He will come for previsit today at 1:30 pm.

## 2015-06-15 NOTE — Telephone Encounter (Signed)
Does patient need O.V (you have no openings until July so would need to be with PA) or do you want me to put on for direct EGD?

## 2015-06-15 NOTE — Telephone Encounter (Signed)
Symptoms as described in ER note sounds almost like pill esophagitis PPI was started which I recommend he continue I'm okay with direct endoscopy unless symptoms change or worsen before

## 2015-06-15 NOTE — Telephone Encounter (Signed)
Patient called to check status. He says please don't schedule anything without checking with him first. They have a calendar full of appoinments. May 1st & 2nd are open he says.

## 2015-06-15 NOTE — Discharge Instructions (Signed)
Please call the doctor's office tomorrow. We will call them as well - unfortunately, the on call doctor never called, and it's futile to just have you sitting here.  Start omeprazole. Clear liquid diet recommended for now.  Clear Liquid Diet A clear liquid diet is a short-term diet that is prescribed to provide the necessary fluid and basic energy you need when you can have nothing else. The clear liquid diet consists of liquids or solids that will become liquid at room temperature. You should be able to see through the liquid. There are many reasons that you may be restricted to clear liquids, such as:  When you have a sudden-onset (acute) condition that occurs before or after surgery.  To help your body slowly get adjusted to food again after a long period when you were unable to have food.  Replacement of fluids when you have a diarrheal disease.  When you are going to have certain exams, such as a colonoscopy, in which instruments are inserted inside your body to look at parts of your digestive system. WHAT CAN I HAVE? A clear liquid diet does not provide all the nutrients you need. It is important to choose a variety of the following items to get as many nutrients as possible:  Vegetable juices that do not have pulp.  Fruit juices and fruit drinks that do not have pulp.  Coffee (regular or decaffeinated), tea, or soda at the discretion of your health care provider.  Clear bouillon, broth, or strained broth-based soups.  High-protein and flavored gelatins.  Sugar or honey.  Ices or frozen ice pops that do not contain milk. If you are not sure whether you can have certain items, you should ask your health care provider. You may also ask your health care provider if there are any other clear liquid options.   This information is not intended to replace advice given to you by your health care provider. Make sure you discuss any questions you have with your health care provider.     Document Released: 02/03/2005 Document Revised: 02/08/2013 Document Reviewed: 12/31/2012 Elsevier Interactive Patient Education [employer].

## 2015-06-18 ENCOUNTER — Ambulatory Visit (AMBULATORY_SURGERY_CENTER): Payer: Medicare Other | Admitting: Internal Medicine

## 2015-06-18 ENCOUNTER — Encounter: Payer: Self-pay | Admitting: Internal Medicine

## 2015-06-18 VITALS — BP 105/54 | HR 53 | Temp 98.6°F | Resp 14 | Ht 73.0 in | Wt 214.0 lb

## 2015-06-18 DIAGNOSIS — K2990 Gastroduodenitis, unspecified, without bleeding: Secondary | ICD-10-CM | POA: Diagnosis not present

## 2015-06-18 DIAGNOSIS — K317 Polyp of stomach and duodenum: Secondary | ICD-10-CM | POA: Diagnosis not present

## 2015-06-18 DIAGNOSIS — K2970 Gastritis, unspecified, without bleeding: Secondary | ICD-10-CM | POA: Diagnosis not present

## 2015-06-18 DIAGNOSIS — R1310 Dysphagia, unspecified: Secondary | ICD-10-CM | POA: Diagnosis not present

## 2015-06-18 DIAGNOSIS — I10 Essential (primary) hypertension: Secondary | ICD-10-CM | POA: Diagnosis not present

## 2015-06-18 DIAGNOSIS — B9681 Helicobacter pylori [H. pylori] as the cause of diseases classified elsewhere: Secondary | ICD-10-CM | POA: Diagnosis not present

## 2015-06-18 DIAGNOSIS — K2950 Unspecified chronic gastritis without bleeding: Secondary | ICD-10-CM | POA: Diagnosis not present

## 2015-06-18 MED ORDER — SODIUM CHLORIDE 0.9 % IV SOLN: 500.0000 mL | INTRAVENOUS | Status: DC

## 2015-06-18 NOTE — Progress Notes (Signed)
A/ox3, pleased with MAC, report to RN 

## 2015-06-18 NOTE — Op Note (Signed)
[name] Procedure Date: 06/18/2015 2:49 PM MRN: [id number] Endoscopist: Jerene Bears , MD Age: 75 Date of Birth: 21-Mar-1940 Gender: Male Procedure:                Upper GI endoscopy Indications:              Dysphagia Medicines:                Monitored Anesthesia Care Procedure:                Pre-Anesthesia Assessment:                           - Prior to the procedure, a History and Physical                            was performed, and patient medications and                            allergies were reviewed. The patient's tolerance of                            previous anesthesia was also reviewed. The risks                            and benefits of the procedure and the sedation                            options and risks were discussed with the patient.                            All questions were answered, and informed consent                            was obtained. Prior Anticoagulants: The patient has                            taken no previous anticoagulant or antiplatelet                            agents. ASA Grade Assessment: II - A patient with                            mild systemic disease. After reviewing the risks                            and benefits, the patient was deemed in                            satisfactory condition to undergo the procedure.                           After obtaining informed consent, the endoscope was  passed under direct vision. Throughout the                            procedure, the patient's blood pressure, pulse, and                            oxygen saturations were monitored continuously. The                            Model GIF-[id number]) scope was introduced                            through the mouth, and advanced to the second part                            of duodenum. The upper GI endoscopy was                            accomplished  without difficulty. The patient                            tolerated the procedure well. Scope In: Scope Out: Findings:                 No endoscopic abnormality was evident in the                            esophagus to explain the patient's complaint of                            dysphagia. It was decided, however, to proceed with                            dilation of the entire esophagus. A guidewire was                            placed and the scope was withdrawn. Dilation was                            performed with a Savary dilator with no resistance                            at 16 mm.                           A single 5 mm sessile polyp with no bleeding and no                            stigmata of recent bleeding was found in the                            cardia. The polyp was removed with a cold biopsy  forceps. Resection and retrieval were complete.                           Diffuse moderate inflammation characterized by                            congestion (edema) and erythema was found in the                            cardia, in the gastric fundus and in the gastric                            body. Biopsies were taken with a cold forceps for                            histology.                           The examined duodenum was normal. Complications:            No immediate complications. Estimated Blood Loss:     Estimated blood loss was minimal. Impression:               - No endoscopic esophageal abnormality to explain                            patient's dysphagia. Esophagus dilated.                           - A single gastric polyp. Resected and retrieved.                           - Gastritis. Biopsied.                           - Normal examined duodenum. Recommendation:           - Patient has a contact number available for                            emergencies. The signs and symptoms of potential                             delayed complications were discussed with the                            patient. Return to normal activities tomorrow.                            Written discharge instructions were provided to the                            patient.                           - Resume previous diet.                           -  Continue present medications.                           - Await pathology results. Jerene Bears, MD 06/18/2015 3:17:38 PM This report has been signed electronically.

## 2015-06-18 NOTE — Patient Instructions (Signed)
YOU HAD AN ENDOSCOPIC PROCEDURE TODAY AT [facility]:   Refer to the procedure report that was given to you for any specific questions about what was found during the examination.  If the procedure report does not answer your questions, please call your gastroenterologist to clarify.  If you requested that your care partner not be given the details of your procedure findings, then the procedure report has been included in a sealed envelope for you to review at your convenience later.  YOU SHOULD EXPECT: Some feelings of bloating in the abdomen. Passage of more gas than usual.  Walking can help get rid of the air that was put into your GI tract during the procedure and reduce the bloating. If you had a lower endoscopy (such as a colonoscopy or flexible sigmoidoscopy) you may notice spotting of blood in your stool or on the toilet paper. If you underwent a bowel prep for your procedure, you may not have a normal bowel movement for a few days.  Please Note:  You might notice some irritation and congestion in your nose or some drainage.  This is from the oxygen used during your procedure.  There is no need for concern and it should clear up in a day or so.  SYMPTOMS TO REPORT IMMEDIATELY:   Following upper endoscopy (EGD)  Vomiting of blood or coffee ground material  New chest pain or pain under the shoulder blades  Painful or persistently difficult swallowing  New shortness of breath  Fever of 100F or higher  Black, tarry-looking stools  For urgent or emergent issues, a gastroenterologist can be reached at any hour by calling [phone].   DIET: See dilation diet-  Likewise, meals heavy in dairy and vegetables can increase bloating.  Drink plenty of fluids but you should avoid alcoholic beverages for 24 hours.  ACTIVITY:  You should plan to take it easy for the rest of today and you should NOT DRIVE or use heavy machinery until tomorrow (because of the sedation medicines used  during the test).    FOLLOW UP: Our staff will call the number listed on your records the next business day following your procedure to check on you and address any questions or concerns that you may have regarding the information given to you following your procedure. If we do not reach you, we will leave a message.  However, if you are feeling well and you are not experiencing any problems, there is no need to return our call.  We will assume that you have returned to your regular daily activities without incident.  If any biopsies were taken you will be contacted by phone or by letter within the next 1-3 weeks.  Please call us at [phone] if you have not heard about the biopsies in 3 weeks.    SIGNATURES/CONFIDENTIALITY: You and/or your care partner have signed paperwork which will be entered into your electronic medical record.  These signatures attest to the fact that that the information above on your After Visit Summary has been reviewed and is understood.  Full responsibility of the confidentiality of this discharge information lies with you and/or your care-partner.  Gastritis, dilation diet, stricture-handouts given

## 2015-06-18 NOTE — Progress Notes (Signed)
Called to room to assist during endoscopic procedure.  Patient ID and intended procedure confirmed with present staff. Received instructions for my participation in the procedure from the performing physician.  

## 2015-06-19 ENCOUNTER — Telehealth: Payer: Self-pay

## 2015-06-19 NOTE — Telephone Encounter (Signed)
  Follow up Call-  Call back number 06/18/2015  Post procedure Call Back phone  # [phone]  Permission to leave phone message Yes       Patient questions:  Do you have a fever, pain , or abdominal swelling? No. Pain Score  0 *  Have you tolerated food without any problems?Yes  Have you been able to return to your normal activities? Yes.    Do you have any questions about your discharge instructions: Diet   No. Medications  No. Follow up visit  No.  Do you have questions or concerns about your Care? No.  Actions: * If pain score is 4 or above: No action needed, pain <4.

## 2015-06-25 ENCOUNTER — Encounter: Payer: Self-pay | Admitting: Internal Medicine

## 2015-06-29 ENCOUNTER — Telehealth: Payer: Self-pay | Admitting: Internal Medicine

## 2015-06-29 ENCOUNTER — Other Ambulatory Visit: Payer: Self-pay

## 2015-06-29 MED ORDER — OMEPRAZOLE 20 MG PO CPDR: 20.0000 mg | DELAYED_RELEASE_CAPSULE | Freq: Two times a day (BID) | ORAL | Status: DC

## 2015-06-29 MED ORDER — BIS SUBCIT-METRONID-TETRACYC 140-125-125 MG PO CAPS: 3.0000 | ORAL_CAPSULE | Freq: Three times a day (TID) | ORAL | Status: DC

## 2015-06-29 NOTE — Telephone Encounter (Signed)
Pharmacy states they do have script but not drug. State it is ordered and should have it around 9:30am and they will notify pt. Pt aware.

## 2015-07-10 ENCOUNTER — Telehealth: Payer: Self-pay | Admitting: Internal Medicine

## 2015-07-10 NOTE — Telephone Encounter (Signed)
Patient was told to call Zachary Mcdonald on the AVS after his procedure. He is finishing Pylera. States it has been difficult to take it as directed because he does not eat 3 times a day. But he does plan to take it all. He is feeling well.

## 2015-07-13 DIAGNOSIS — L821 Other seborrheic keratosis: Secondary | ICD-10-CM | POA: Diagnosis not present

## 2015-07-13 DIAGNOSIS — D225 Melanocytic nevi of trunk: Secondary | ICD-10-CM | POA: Diagnosis not present

## 2015-07-13 DIAGNOSIS — D1801 Hemangioma of skin and subcutaneous tissue: Secondary | ICD-10-CM | POA: Diagnosis not present

## 2015-07-13 DIAGNOSIS — Z85828 Personal history of other malignant neoplasm of skin: Secondary | ICD-10-CM | POA: Diagnosis not present

## 2015-08-09 ENCOUNTER — Encounter: Payer: Self-pay | Admitting: *Deleted

## 2015-08-28 ENCOUNTER — Ambulatory Visit (INDEPENDENT_AMBULATORY_CARE_PROVIDER_SITE_OTHER): Payer: Medicare Other | Admitting: Internal Medicine

## 2015-08-28 ENCOUNTER — Encounter: Payer: Self-pay | Admitting: Internal Medicine

## 2015-08-28 VITALS — BP 120/58 | HR 68 | Ht 73.0 in | Wt 211.2 lb

## 2015-08-28 DIAGNOSIS — B9681 Helicobacter pylori [H. pylori] as the cause of diseases classified elsewhere: Secondary | ICD-10-CM

## 2015-08-28 DIAGNOSIS — K219 Gastro-esophageal reflux disease without esophagitis: Secondary | ICD-10-CM

## 2015-08-28 DIAGNOSIS — R1314 Dysphagia, pharyngoesophageal phase: Secondary | ICD-10-CM | POA: Diagnosis not present

## 2015-08-28 DIAGNOSIS — A048 Other specified bacterial intestinal infections: Secondary | ICD-10-CM

## 2015-08-28 DIAGNOSIS — R1310 Dysphagia, unspecified: Secondary | ICD-10-CM

## 2015-08-28 DIAGNOSIS — R1319 Other dysphagia: Secondary | ICD-10-CM

## 2015-08-28 NOTE — Patient Instructions (Signed)
Follow up with Dr Hilarie Fredrickson as needed.  Your physician has requested that you go to the basement for the following lab work around 09/18/15: H Pylori Stool antigen  Make sure you have been OFF of your PPI (omeprazole) x 5 days prior to completing stool test for H Pylori  You may restart your omeprazole after you have returned your H Pylori stool test.  If you are age 75 or older, your body mass index should be between 23-30. Your Body mass index is 27.87 kg/(m^2). If this is out of the aforementioned range listed, please consider follow up with your Primary Care Provider.  If you are age 63 or younger, your body mass index should be between 19-25. Your Body mass index is 27.87 kg/(m^2). If this is out of the aformentioned range listed, please consider follow up with your Primary Care Provider.

## 2015-08-28 NOTE — Progress Notes (Signed)
   Subjective:    Patient ID: Zachary Mcdonald, male    DOB: [birth year], 75 y.o.   MRN: [id number]  HPI Garcia Carrisalez is a 54 -year-old male with a past medical history of GERD, esophageal dysphagia, H. pylori gastritis, colon polyps, hypertension and arthritis who is seen in follow-up. He came for upper endoscopy performed on 06/18/2015 to evaluate dysphagia. This showed no endoscopic abnormality in the esophagus but empiric dilation was performed with Savary dilator at 16 mm without resistance. There was a 5 mm sessile polyp in the gastric cardia which was removed by biopsy. There was diffuse moderate inflammation in the proximal stomach which was biopsied. The examined duodenum was normal. Biopsies showed H. pylori gastritis. The polyp was hyperplastic without dysplasia.  He was treated with Pylera which he completed. He has remained on omeprazole 20 mg daily. Today he reports that he is feeling very well. His dysphagia improved dramatically with dilation. He is eating and swallowing well. No odynophagia. No nausea or vomiting. Bowel movements are regular without blood in his stool or melena.  Review of Systems As per HPI, otherwise negative  Current Medications, Allergies, Past Medical History, Past Surgical History, Family History and Social History were reviewed in [employer] record.     Objective:   Physical Exam BP 120/58 mmHg  Pulse 68  Ht 6\' 1"  (1.854 m)  Wt 211 lb 3.2 oz (95.8 kg)  BMI 27.87 kg/m2 Constitutional: Well-developed and well-nourished. No distress. HEENT: Normocephalic and atraumatic. Oropharynx is clear and moist. No oropharyngeal exudate. Conjunctivae are normal.  No scleral icterus. Neck: Neck supple. Trachea midline. Cardiovascular: Normal rate, regular rhythm and intact distal pulses. No M/R/G Pulmonary/chest: Effort normal and breath sounds normal. No wheezing, rales or rhonchi. Abdominal: Soft, nontender, nondistended. Bowel sounds active  throughout.  Extremities: no clubbing, cyanosis, or edema Neurological: Alert and oriented to person place and time. Skin: Skin is warm and dry. No rashes noted. Psychiatric: Normal mood and affect. Behavior is normal.     Assessment & Plan:   45 -year-old male with a past medical history of GERD, esophageal dysphagia, H. pylori gastritis, colon polyps, hypertension and arthritis who is seen in follow-up.  1. GERD with dysphagia -- excellent response to empiric dilation of the esophagus to 16 mm with Savary.  GERD is well controlled currently on omeprazole. He is tried to go without this medication but symptoms recur. Continue omeprazole 20 mg daily. Repeat dilation as needed.  2. H. pylori gastritis -- treated with Pylera. Recommended H. pylori stool antigen to confirm eradication. He will need to be off PPI prior to this test. Test ordered today  3. Hyperplastic gastric polyp -- small and removed completely by forcep polypectomy. No dysplasia. Given complete resection and H. pylori treatment, surveillance endoscopy not felt warranted.  4. Colorectal cancer screening -- last colonoscopy April 2014 with Dr. Sharlett Iles. Moderate diverticulosis in the left colon but otherwise normal exam. Repeat colonoscopy would be recommended in 2024 at which point he would be 75 years old. He will likely not require repeat screening/surveillance exam based on age. He understands and agrees with this plan.  15 minutes spent with the patient today. Greater than 50% was spent in counseling and coordination of care with the patient

## 2015-09-20 ENCOUNTER — Other Ambulatory Visit: Payer: Medicare Other

## 2015-09-20 DIAGNOSIS — R1310 Dysphagia, unspecified: Secondary | ICD-10-CM

## 2015-09-20 DIAGNOSIS — K219 Gastro-esophageal reflux disease without esophagitis: Secondary | ICD-10-CM | POA: Diagnosis not present

## 2015-09-20 DIAGNOSIS — R1319 Other dysphagia: Secondary | ICD-10-CM

## 2015-09-20 DIAGNOSIS — B9681 Helicobacter pylori [H. pylori] as the cause of diseases classified elsewhere: Secondary | ICD-10-CM | POA: Diagnosis not present

## 2015-09-20 DIAGNOSIS — A048 Other specified bacterial intestinal infections: Secondary | ICD-10-CM

## 2015-09-20 DIAGNOSIS — R1314 Dysphagia, pharyngoesophageal phase: Secondary | ICD-10-CM | POA: Diagnosis not present

## 2015-09-21 LAB — HELICOBACTER PYLORI  SPECIAL ANTIGEN: H. PYLORI Antigen: DETECTED

## 2015-09-25 ENCOUNTER — Other Ambulatory Visit: Payer: Self-pay

## 2015-09-25 MED ORDER — CLARITHROMYCIN 500 MG PO TABS: 500.0000 mg | ORAL_TABLET | Freq: Two times a day (BID) | ORAL | 0 refills | Status: DC

## 2015-09-25 MED ORDER — OMEPRAZOLE 20 MG PO CPDR: 20.0000 mg | DELAYED_RELEASE_CAPSULE | Freq: Two times a day (BID) | ORAL | 0 refills | Status: DC

## 2015-09-25 MED ORDER — AMOXICILLIN 500 MG PO TABS: 1000.0000 mg | ORAL_TABLET | Freq: Two times a day (BID) | ORAL | 0 refills | Status: DC

## 2015-10-31 DIAGNOSIS — M4806 Spinal stenosis, lumbar region: Secondary | ICD-10-CM | POA: Diagnosis not present

## 2015-10-31 DIAGNOSIS — L309 Dermatitis, unspecified: Secondary | ICD-10-CM | POA: Diagnosis not present

## 2015-10-31 DIAGNOSIS — I10 Essential (primary) hypertension: Secondary | ICD-10-CM | POA: Diagnosis not present

## 2015-10-31 DIAGNOSIS — Z23 Encounter for immunization: Secondary | ICD-10-CM | POA: Diagnosis not present

## 2015-10-31 DIAGNOSIS — N183 Chronic kidney disease, stage 3 (moderate): Secondary | ICD-10-CM | POA: Diagnosis not present

## 2015-10-31 DIAGNOSIS — E119 Type 2 diabetes mellitus without complications: Secondary | ICD-10-CM | POA: Diagnosis not present

## 2015-10-31 DIAGNOSIS — R1310 Dysphagia, unspecified: Secondary | ICD-10-CM | POA: Diagnosis not present

## 2015-10-31 DIAGNOSIS — Z6829 Body mass index (BMI) 29.0-29.9, adult: Secondary | ICD-10-CM | POA: Diagnosis not present

## 2015-10-31 DIAGNOSIS — M25512 Pain in left shoulder: Secondary | ICD-10-CM | POA: Diagnosis not present

## 2015-11-19 ENCOUNTER — Other Ambulatory Visit: Payer: Self-pay

## 2015-11-19 DIAGNOSIS — A048 Other specified bacterial intestinal infections: Secondary | ICD-10-CM

## 2015-12-03 ENCOUNTER — Other Ambulatory Visit: Payer: Medicare Other

## 2015-12-03 DIAGNOSIS — A048 Other specified bacterial intestinal infections: Secondary | ICD-10-CM

## 2015-12-04 LAB — HELICOBACTER PYLORI  SPECIAL ANTIGEN: H. PYLORI Antigen: DETECTED

## 2015-12-05 ENCOUNTER — Other Ambulatory Visit: Payer: Self-pay

## 2015-12-05 DIAGNOSIS — R768 Other specified abnormal immunological findings in serum: Secondary | ICD-10-CM

## 2015-12-05 MED ORDER — BISMUTH SUBSALICYLATE 262 MG PO CHEW: 262.0000 mg | CHEWABLE_TABLET | Freq: Three times a day (TID) | ORAL | 0 refills | Status: AC

## 2015-12-05 MED ORDER — TETRACYCLINE HCL 500 MG PO CAPS: 500.0000 mg | ORAL_CAPSULE | Freq: Four times a day (QID) | ORAL | 0 refills | Status: DC

## 2015-12-05 MED ORDER — OMEPRAZOLE 20 MG PO CPDR: 20.0000 mg | DELAYED_RELEASE_CAPSULE | Freq: Two times a day (BID) | ORAL | 0 refills | Status: DC

## 2015-12-05 MED ORDER — METRONIDAZOLE 250 MG PO TABS: 250.0000 mg | ORAL_TABLET | Freq: Four times a day (QID) | ORAL | 0 refills | Status: DC

## 2016-01-28 DIAGNOSIS — M109 Gout, unspecified: Secondary | ICD-10-CM | POA: Diagnosis not present

## 2016-01-28 DIAGNOSIS — E784 Other hyperlipidemia: Secondary | ICD-10-CM | POA: Diagnosis not present

## 2016-01-28 DIAGNOSIS — E038 Other specified hypothyroidism: Secondary | ICD-10-CM | POA: Diagnosis not present

## 2016-01-28 DIAGNOSIS — Z125 Encounter for screening for malignant neoplasm of prostate: Secondary | ICD-10-CM | POA: Diagnosis not present

## 2016-01-28 DIAGNOSIS — E119 Type 2 diabetes mellitus without complications: Secondary | ICD-10-CM | POA: Diagnosis not present

## 2016-01-28 DIAGNOSIS — I10 Essential (primary) hypertension: Secondary | ICD-10-CM | POA: Diagnosis not present

## 2016-02-04 DIAGNOSIS — E038 Other specified hypothyroidism: Secondary | ICD-10-CM | POA: Diagnosis not present

## 2016-02-04 DIAGNOSIS — E784 Other hyperlipidemia: Secondary | ICD-10-CM | POA: Diagnosis not present

## 2016-02-04 DIAGNOSIS — Z1389 Encounter for screening for other disorder: Secondary | ICD-10-CM | POA: Diagnosis not present

## 2016-02-04 DIAGNOSIS — K5790 Diverticulosis of intestine, part unspecified, without perforation or abscess without bleeding: Secondary | ICD-10-CM | POA: Diagnosis not present

## 2016-02-04 DIAGNOSIS — Z0000 Encounter for general adult medical examination without abnormal findings: Secondary | ICD-10-CM | POA: Diagnosis not present

## 2016-02-04 DIAGNOSIS — M109 Gout, unspecified: Secondary | ICD-10-CM | POA: Diagnosis not present

## 2016-02-04 DIAGNOSIS — I10 Essential (primary) hypertension: Secondary | ICD-10-CM | POA: Diagnosis not present

## 2016-02-04 DIAGNOSIS — E119 Type 2 diabetes mellitus without complications: Secondary | ICD-10-CM | POA: Diagnosis not present

## 2016-02-04 DIAGNOSIS — N183 Chronic kidney disease, stage 3 (moderate): Secondary | ICD-10-CM | POA: Diagnosis not present

## 2016-02-04 DIAGNOSIS — M4807 Spinal stenosis, lumbosacral region: Secondary | ICD-10-CM | POA: Diagnosis not present

## 2016-02-04 DIAGNOSIS — N401 Enlarged prostate with lower urinary tract symptoms: Secondary | ICD-10-CM | POA: Diagnosis not present

## 2016-02-05 DIAGNOSIS — Z1212 Encounter for screening for malignant neoplasm of rectum: Secondary | ICD-10-CM | POA: Diagnosis not present

## 2016-03-17 DIAGNOSIS — M545 Low back pain: Secondary | ICD-10-CM | POA: Diagnosis not present

## 2016-03-24 ENCOUNTER — Encounter (HOSPITAL_COMMUNITY): Payer: Self-pay

## 2016-03-24 ENCOUNTER — Inpatient Hospital Stay (HOSPITAL_COMMUNITY)
Admission: EM | Admit: 2016-03-24 | Discharge: 2016-03-28 | DRG: 872 | Disposition: A | Discharge status: Home Health | Payer: Medicare Other | Attending: Internal Medicine | Admitting: Internal Medicine

## 2016-03-24 ENCOUNTER — Inpatient Hospital Stay (HOSPITAL_COMMUNITY): Payer: Medicare Other

## 2016-03-24 ENCOUNTER — Emergency Department (HOSPITAL_COMMUNITY): Payer: Medicare Other

## 2016-03-24 DIAGNOSIS — B954 Other streptococcus as the cause of diseases classified elsewhere: Secondary | ICD-10-CM | POA: Diagnosis not present

## 2016-03-24 DIAGNOSIS — I10 Essential (primary) hypertension: Secondary | ICD-10-CM

## 2016-03-24 DIAGNOSIS — E876 Hypokalemia: Secondary | ICD-10-CM | POA: Diagnosis present

## 2016-03-24 DIAGNOSIS — N179 Acute kidney failure, unspecified: Secondary | ICD-10-CM | POA: Diagnosis present

## 2016-03-24 DIAGNOSIS — M00262 Other streptococcal arthritis, left knee: Secondary | ICD-10-CM | POA: Diagnosis not present

## 2016-03-24 DIAGNOSIS — D649 Anemia, unspecified: Secondary | ICD-10-CM | POA: Diagnosis present

## 2016-03-24 DIAGNOSIS — N289 Disorder of kidney and ureter, unspecified: Secondary | ICD-10-CM

## 2016-03-24 DIAGNOSIS — R7303 Prediabetes: Secondary | ICD-10-CM | POA: Diagnosis present

## 2016-03-24 DIAGNOSIS — G8929 Other chronic pain: Secondary | ICD-10-CM | POA: Diagnosis present

## 2016-03-24 DIAGNOSIS — E785 Hyperlipidemia, unspecified: Secondary | ICD-10-CM | POA: Diagnosis present

## 2016-03-24 DIAGNOSIS — R7881 Bacteremia: Secondary | ICD-10-CM | POA: Diagnosis not present

## 2016-03-24 DIAGNOSIS — M109 Gout, unspecified: Secondary | ICD-10-CM | POA: Diagnosis present

## 2016-03-24 DIAGNOSIS — K219 Gastro-esophageal reflux disease without esophagitis: Secondary | ICD-10-CM | POA: Diagnosis present

## 2016-03-24 DIAGNOSIS — Z79899 Other long term (current) drug therapy: Secondary | ICD-10-CM | POA: Diagnosis not present

## 2016-03-24 DIAGNOSIS — M609 Myositis, unspecified: Secondary | ICD-10-CM | POA: Diagnosis present

## 2016-03-24 DIAGNOSIS — Z833 Family history of diabetes mellitus: Secondary | ICD-10-CM | POA: Diagnosis not present

## 2016-03-24 DIAGNOSIS — M25551 Pain in right hip: Secondary | ICD-10-CM | POA: Diagnosis not present

## 2016-03-24 DIAGNOSIS — M545 Low back pain: Secondary | ICD-10-CM | POA: Diagnosis not present

## 2016-03-24 DIAGNOSIS — N320 Bladder-neck obstruction: Secondary | ICD-10-CM | POA: Diagnosis not present

## 2016-03-24 DIAGNOSIS — R509 Fever, unspecified: Secondary | ICD-10-CM | POA: Diagnosis not present

## 2016-03-24 DIAGNOSIS — D696 Thrombocytopenia, unspecified: Secondary | ICD-10-CM | POA: Diagnosis not present

## 2016-03-24 DIAGNOSIS — E039 Hypothyroidism, unspecified: Secondary | ICD-10-CM | POA: Diagnosis not present

## 2016-03-24 DIAGNOSIS — A419 Sepsis, unspecified organism: Secondary | ICD-10-CM | POA: Diagnosis present

## 2016-03-24 DIAGNOSIS — Z885 Allergy status to narcotic agent status: Secondary | ICD-10-CM | POA: Diagnosis not present

## 2016-03-24 DIAGNOSIS — R944 Abnormal results of kidney function studies: Secondary | ICD-10-CM | POA: Diagnosis not present

## 2016-03-24 DIAGNOSIS — M7052 Other bursitis of knee, left knee: Secondary | ICD-10-CM | POA: Diagnosis present

## 2016-03-24 DIAGNOSIS — B955 Unspecified streptococcus as the cause of diseases classified elsewhere: Secondary | ICD-10-CM | POA: Diagnosis present

## 2016-03-24 DIAGNOSIS — N401 Enlarged prostate with lower urinary tract symptoms: Secondary | ICD-10-CM | POA: Diagnosis not present

## 2016-03-24 DIAGNOSIS — M009 Pyogenic arthritis, unspecified: Secondary | ICD-10-CM

## 2016-03-24 DIAGNOSIS — L03116 Cellulitis of left lower limb: Secondary | ICD-10-CM | POA: Diagnosis not present

## 2016-03-24 DIAGNOSIS — Z888 Allergy status to other drugs, medicaments and biological substances status: Secondary | ICD-10-CM | POA: Diagnosis not present

## 2016-03-24 DIAGNOSIS — Z466 Encounter for fitting and adjustment of urinary device: Secondary | ICD-10-CM | POA: Diagnosis not present

## 2016-03-24 DIAGNOSIS — M25562 Pain in left knee: Secondary | ICD-10-CM | POA: Diagnosis not present

## 2016-03-24 DIAGNOSIS — R338 Other retention of urine: Secondary | ICD-10-CM | POA: Diagnosis not present

## 2016-03-24 DIAGNOSIS — Z800 Family history of malignant neoplasm of digestive organs: Secondary | ICD-10-CM | POA: Diagnosis not present

## 2016-03-24 DIAGNOSIS — Z960 Presence of urogenital implants: Secondary | ICD-10-CM | POA: Diagnosis not present

## 2016-03-24 DIAGNOSIS — R109 Unspecified abdominal pain: Secondary | ICD-10-CM | POA: Diagnosis not present

## 2016-03-24 DIAGNOSIS — M48061 Spinal stenosis, lumbar region without neurogenic claudication: Secondary | ICD-10-CM | POA: Diagnosis not present

## 2016-03-24 DIAGNOSIS — M539 Dorsopathy, unspecified: Secondary | ICD-10-CM | POA: Diagnosis present

## 2016-03-24 LAB — URINALYSIS, ROUTINE W REFLEX MICROSCOPIC
Bilirubin Urine: NEGATIVE
Glucose, UA: NEGATIVE mg/dL
Hgb urine dipstick: NEGATIVE
Ketones, ur: NEGATIVE mg/dL
LEUKOCYTES UA: NEGATIVE
NITRITE: NEGATIVE
Protein, ur: NEGATIVE mg/dL
SPECIFIC GRAVITY, URINE: 1.017 (ref 1.005–1.030)
pH: 5 (ref 5.0–8.0)

## 2016-03-24 LAB — CBC WITH DIFFERENTIAL/PLATELET
BASOS ABS: 0 10*3/uL (ref 0.0–0.1)
Basophils Relative: 0 %
Eosinophils Absolute: 0 10*3/uL (ref 0.0–0.7)
Eosinophils Relative: 0 %
HCT: 39.8 % (ref 39.0–52.0)
Hemoglobin: 14.1 g/dL (ref 13.0–17.0)
LYMPHS PCT: 9 %
Lymphs Abs: 1.6 10*3/uL (ref 0.7–4.0)
MCH: 30.9 pg (ref 26.0–34.0)
MCHC: 35.4 g/dL (ref 30.0–36.0)
MCV: 87.1 fL (ref 78.0–100.0)
MONO ABS: 1.8 10*3/uL — AB (ref 0.1–1.0)
Monocytes Relative: 9 %
NEUTROS ABS: 15.2 10*3/uL — AB (ref 1.7–7.7)
Neutrophils Relative %: 82 %
Platelets: 186 10*3/uL (ref 150–400)
RBC: 4.57 MIL/uL (ref 4.22–5.81)
RDW: 13.6 % (ref 11.5–15.5)
WBC: 18.5 10*3/uL — ABNORMAL HIGH (ref 4.0–10.5)

## 2016-03-24 LAB — SURGICAL PCR SCREEN
MRSA, PCR: POSITIVE — AB
STAPHYLOCOCCUS AUREUS: POSITIVE — AB

## 2016-03-24 LAB — COMPREHENSIVE METABOLIC PANEL
ALT: 22 U/L (ref 17–63)
AST: 16 U/L (ref 15–41)
Albumin: 3.8 g/dL (ref 3.5–5.0)
Alkaline Phosphatase: 44 U/L (ref 38–126)
Anion gap: 7 (ref 5–15)
BILIRUBIN TOTAL: 1 mg/dL (ref 0.3–1.2)
BUN: 31 mg/dL — ABNORMAL HIGH (ref 6–20)
CHLORIDE: 100 mmol/L — AB (ref 101–111)
CO2: 28 mmol/L (ref 22–32)
CREATININE: 1.25 mg/dL — AB (ref 0.61–1.24)
Calcium: 8.8 mg/dL — ABNORMAL LOW (ref 8.9–10.3)
GFR calc Af Amer: >60 mL/min (ref >60)
GFR, EST NON AFRICAN AMERICAN: 55 mL/min — AB (ref >60)
Glucose, Bld: 163 mg/dL — ABNORMAL HIGH (ref 65–99)
POTASSIUM: 3.1 mmol/L — AB (ref 3.5–5.1)
Sodium: 135 mmol/L (ref 135–145)
TOTAL PROTEIN: 6.2 g/dL — AB (ref 6.5–8.1)

## 2016-03-24 LAB — CREATININE, SERUM
Creatinine, Ser: 1.11 mg/dL (ref 0.61–1.24)
GFR calc Af Amer: >60 mL/min (ref >60)
GFR calc non Af Amer: >60 mL/min (ref >60)

## 2016-03-24 LAB — PROCALCITONIN: Procalcitonin: 3.17 ng/mL

## 2016-03-24 LAB — SYNOVIAL CELL COUNT + DIFF, W/ CRYSTALS
CRYSTALS FLUID: NONE SEEN
MONOCYTE-MACROPHAGE-SYNOVIAL FLUID: 17 % — AB (ref 50–90)
NEUTROPHIL, SYNOVIAL: 82 % — AB (ref 0–25)
WBC, Synovial: 102375 /mm3 — ABNORMAL HIGH (ref 0–200)

## 2016-03-24 LAB — GLUCOSE, CAPILLARY: GLUCOSE-CAPILLARY: 131 mg/dL — AB (ref 65–99)

## 2016-03-24 LAB — CBC
HEMATOCRIT: 36.5 % — AB (ref 39.0–52.0)
Hemoglobin: 13.2 g/dL (ref 13.0–17.0)
MCH: 32.6 pg (ref 26.0–34.0)
MCHC: 36.2 g/dL — AB (ref 30.0–36.0)
MCV: 90.1 fL (ref 78.0–100.0)
PLATELETS: 166 10*3/uL (ref 150–400)
RBC: 4.05 MIL/uL — ABNORMAL LOW (ref 4.22–5.81)
RDW: 13.8 % (ref 11.5–15.5)
WBC: 20.5 10*3/uL — AB (ref 4.0–10.5)

## 2016-03-24 LAB — LACTIC ACID, PLASMA: Lactic Acid, Venous: 1 mmol/L (ref 0.5–1.9)

## 2016-03-24 LAB — I-STAT CG4 LACTIC ACID, ED: Lactic Acid, Venous: 2.1 mmol/L (ref 0.5–1.9)

## 2016-03-24 MED ORDER — FENTANYL CITRATE (PF) 100 MCG/2ML IJ SOLN: 50.0000 ug | INTRAMUSCULAR | Status: DC | PRN

## 2016-03-24 MED ORDER — OXYCODONE-ACETAMINOPHEN 10-325 MG PO TABS
1.0000 | ORAL_TABLET | ORAL | Status: DC | PRN
Start: 2016-03-24 — End: 2016-03-24

## 2016-03-24 MED ORDER — POTASSIUM CHLORIDE CRYS ER 20 MEQ PO TBCR
40.0000 meq | EXTENDED_RELEASE_TABLET | Freq: Two times a day (BID) | ORAL | Status: AC
  Administered 2016-03-24 (×2): 40 meq via ORAL
  Filled 2016-03-24 (×2): qty 2

## 2016-03-24 MED ORDER — ACETAMINOPHEN 325 MG PO TABS
650.0000 mg | ORAL_TABLET | Freq: Once | ORAL | Status: AC
  Administered 2016-03-24: 650 mg via ORAL
  Filled 2016-03-24: qty 2

## 2016-03-24 MED ORDER — CEFAZOLIN IN D5W 1 GM/50ML IV SOLN
1.0000 g | Freq: Once | INTRAVENOUS | Status: AC
  Administered 2016-03-24: 1 g via INTRAVENOUS
  Filled 2016-03-24: qty 50

## 2016-03-24 MED ORDER — LIDOCAINE-EPINEPHRINE (PF) 2 %-1:200000 IJ SOLN
20.0000 mL | Freq: Once | INTRAMUSCULAR | Status: AC
  Administered 2016-03-24: 20 mL via INTRADERMAL
  Filled 2016-03-24: qty 20

## 2016-03-24 MED ORDER — CHLORHEXIDINE GLUCONATE 4 % EX LIQD
60.0000 mL | Freq: Once | CUTANEOUS | Status: AC
  Administered 2016-03-25: 4 via TOPICAL
  Filled 2016-03-24 (×2): qty 60

## 2016-03-24 MED ORDER — SIMVASTATIN 40 MG PO TABS
40.0000 mg | ORAL_TABLET | Freq: Every evening | ORAL | Status: DC
  Administered 2016-03-24: 40 mg via ORAL
  Filled 2016-03-24 (×2): qty 1

## 2016-03-24 MED ORDER — SODIUM CHLORIDE 0.9 % IV BOLUS (SEPSIS)
500.0000 mL | Freq: Once | INTRAVENOUS | Status: AC
  Administered 2016-03-24: 500 mL via INTRAVENOUS

## 2016-03-24 MED ORDER — ALBUTEROL SULFATE (2.5 MG/3ML) 0.083% IN NEBU: 2.5000 mg | INHALATION_SOLUTION | RESPIRATORY_TRACT | Status: DC | PRN

## 2016-03-24 MED ORDER — POLYETHYLENE GLYCOL 3350 17 G PO PACK: 17.0000 g | PACK | Freq: Every day | ORAL | Status: DC | PRN

## 2016-03-24 MED ORDER — CEFAZOLIN SODIUM-DEXTROSE 2-4 GM/100ML-% IV SOLN: 2.0000 g | INTRAVENOUS | Status: DC

## 2016-03-24 MED ORDER — OXYCODONE HCL 5 MG PO TABS
5.0000 mg | ORAL_TABLET | ORAL | Status: DC | PRN
Start: 2016-03-24 — End: 2016-03-28
  Administered 2016-03-24 – 2016-03-28 (×5): 5 mg via ORAL
  Filled 2016-03-24 (×5): qty 1

## 2016-03-24 MED ORDER — ALLOPURINOL 300 MG PO TABS
300.0000 mg | ORAL_TABLET | Freq: Every day | ORAL | Status: DC
  Administered 2016-03-24 – 2016-03-28 (×4): 300 mg via ORAL
  Filled 2016-03-24 (×4): qty 1

## 2016-03-24 MED ORDER — BISACODYL 10 MG RE SUPP
10.0000 mg | Freq: Every day | RECTAL | Status: DC | PRN
  Administered 2016-03-27: 10 mg via RECTAL
  Filled 2016-03-24: qty 1

## 2016-03-24 MED ORDER — HYDROMORPHONE HCL 2 MG PO TABS
2.0000 mg | ORAL_TABLET | ORAL | Status: DC | PRN
  Administered 2016-03-24: 2 mg via ORAL
  Filled 2016-03-24: qty 1

## 2016-03-24 MED ORDER — CHLORHEXIDINE GLUCONATE CLOTH 2 % EX PADS: 6.0000 | MEDICATED_PAD | Freq: Every day | CUTANEOUS | Status: DC

## 2016-03-24 MED ORDER — VANCOMYCIN HCL 10 G IV SOLR
1250.0000 mg | Freq: Two times a day (BID) | INTRAVENOUS | Status: DC
  Administered 2016-03-24 – 2016-03-25 (×2): 1250 mg via INTRAVENOUS
  Filled 2016-03-24 (×2): qty 1250

## 2016-03-24 MED ORDER — OMEGA-3 FATTY ACIDS 1000 MG PO CAPS: 2.0000 g | ORAL_CAPSULE | Freq: Every day | ORAL | Status: DC

## 2016-03-24 MED ORDER — TRAMADOL HCL 50 MG PO TABS: 50.0000 mg | ORAL_TABLET | Freq: Four times a day (QID) | ORAL | Status: DC | PRN

## 2016-03-24 MED ORDER — FENTANYL CITRATE (PF) 100 MCG/2ML IJ SOLN
50.0000 ug | Freq: Once | INTRAMUSCULAR | Status: AC
  Administered 2016-03-24: 50 ug via INTRAVENOUS

## 2016-03-24 MED ORDER — CHLORHEXIDINE GLUCONATE 4 % EX LIQD
60.0000 mL | Freq: Once | CUTANEOUS | Status: DC
  Filled 2016-03-24: qty 60

## 2016-03-24 MED ORDER — LEVOTHYROXINE SODIUM 50 MCG PO TABS
50.0000 ug | ORAL_TABLET | Freq: Every day | ORAL | Status: DC
  Administered 2016-03-25 – 2016-03-28 (×4): 50 ug via ORAL
  Filled 2016-03-24 (×4): qty 1

## 2016-03-24 MED ORDER — SODIUM CHLORIDE 0.9 % IV BOLUS (SEPSIS)
1000.0000 mL | Freq: Once | INTRAVENOUS | Status: AC
  Administered 2016-03-24: 1000 mL via INTRAVENOUS

## 2016-03-24 MED ORDER — PIPERACILLIN-TAZOBACTAM 3.375 G IVPB 30 MIN
3.3750 g | Freq: Once | INTRAVENOUS | Status: AC
  Administered 2016-03-24: 3.375 g via INTRAVENOUS
  Filled 2016-03-24: qty 50

## 2016-03-24 MED ORDER — POVIDONE-IODINE 10 % EX SWAB
2.0000 "application " | Freq: Once | CUTANEOUS | Status: AC
  Administered 2016-03-25: 2 via TOPICAL

## 2016-03-24 MED ORDER — OXYCODONE-ACETAMINOPHEN 5-325 MG PO TABS
1.0000 | ORAL_TABLET | ORAL | Status: DC | PRN
  Administered 2016-03-24 – 2016-03-28 (×5): 1 via ORAL
  Filled 2016-03-24 (×5): qty 1

## 2016-03-24 MED ORDER — ACETAMINOPHEN 325 MG PO TABS
650.0000 mg | ORAL_TABLET | Freq: Four times a day (QID) | ORAL | Status: DC | PRN
  Administered 2016-03-24: 650 mg via ORAL
  Filled 2016-03-24: qty 2

## 2016-03-24 MED ORDER — ONDANSETRON HCL 4 MG/2ML IJ SOLN
4.0000 mg | Freq: Three times a day (TID) | INTRAMUSCULAR | Status: AC | PRN
Start: 2016-03-24 — End: 2016-03-24

## 2016-03-24 MED ORDER — SODIUM CHLORIDE 0.9 % IV SOLN
INTRAVENOUS | Status: DC
  Administered 2016-03-24 – 2016-03-26 (×4): via INTRAVENOUS

## 2016-03-24 MED ORDER — VANCOMYCIN HCL IN DEXTROSE 1-5 GM/200ML-% IV SOLN
1000.0000 mg | Freq: Once | INTRAVENOUS | Status: AC
  Administered 2016-03-24: 1000 mg via INTRAVENOUS
  Filled 2016-03-24: qty 200

## 2016-03-24 MED ORDER — SENNA 8.6 MG PO TABS
1.0000 | ORAL_TABLET | Freq: Two times a day (BID) | ORAL | Status: DC
  Administered 2016-03-24 – 2016-03-28 (×7): 8.6 mg via ORAL
  Filled 2016-03-24 (×8): qty 1

## 2016-03-24 MED ORDER — FENTANYL CITRATE (PF) 100 MCG/2ML IJ SOLN
50.0000 ug | INTRAMUSCULAR | Status: DC | PRN
  Administered 2016-03-24 – 2016-03-25 (×3): 50 ug via INTRAVENOUS
  Filled 2016-03-24 (×4): qty 2

## 2016-03-24 MED ORDER — ACETAMINOPHEN 650 MG RE SUPP
650.0000 mg | Freq: Four times a day (QID) | RECTAL | Status: DC | PRN
  Administered 2016-03-25: 650 mg via RECTAL
  Filled 2016-03-24: qty 1

## 2016-03-24 MED ORDER — MUPIROCIN 2 % EX OINT
1.0000 "application " | TOPICAL_OINTMENT | Freq: Two times a day (BID) | CUTANEOUS | Status: DC
  Administered 2016-03-24 – 2016-03-28 (×8): 1 via NASAL
  Filled 2016-03-24: qty 22

## 2016-03-24 MED ORDER — GADOBENATE DIMEGLUMINE 529 MG/ML IV SOLN
20.0000 mL | Freq: Once | INTRAVENOUS | Status: AC | PRN
  Administered 2016-03-24: 20 mL via INTRAVENOUS

## 2016-03-24 MED ORDER — HEPARIN SODIUM (PORCINE) 5000 UNIT/ML IJ SOLN
5000.0000 [IU] | Freq: Three times a day (TID) | INTRAMUSCULAR | Status: DC
  Administered 2016-03-24: 5000 [IU] via SUBCUTANEOUS
  Filled 2016-03-24: qty 1

## 2016-03-24 MED ORDER — LACTATED RINGERS IV SOLN: INTRAVENOUS | Status: DC

## 2016-03-24 MED ORDER — PIPERACILLIN-TAZOBACTAM 3.375 G IVPB
3.3750 g | Freq: Three times a day (TID) | INTRAVENOUS | Status: DC
  Administered 2016-03-24 – 2016-03-26 (×5): 3.375 g via INTRAVENOUS
  Filled 2016-03-24 (×4): qty 50

## 2016-03-24 NOTE — Progress Notes (Signed)
CRITICAL VALUE ALERT  Critical value received:  MRSA and STAPH positive  Date of notification:  03/24/16  Time of notification:  1950  Critical value read back:Yes.    Nurse who received alert:  Reynold Bowen, RN  MD notified (1st page):  n/a  Time of first page:  n/a  MD notified (2nd page):  Time of second page:  Responding MD: n/a  Time MD responded:  N/a  Patient put on MRSA Positive standing orders

## 2016-03-24 NOTE — ED Notes (Signed)
Pt transported to MRI 

## 2016-03-24 NOTE — Progress Notes (Signed)
Pharmacy Antibiotic Note  Zachary Mcdonald is a 76 y.o. male admitted on 03/24/2016 with Sepsis secondary to left knee septic joint.  Pharmacy has been consulted for vancomycin/Zosyn dosing.  Plan is for knee wash tomorrow. Pt also with lower back pain. MRI scheduled of lower lumbar spine to include sacrum to rule out discitis or infectious disease process.   Plan:  Zosyn 3.375g IV q8h (4 hour infusion).   Vancomycin 1000 mg IV x1, and starting 7 hours later vancomycin 1250 mg IV q12h  Follow up clinical course, renal function, cultures   Height: 6' (182.9 cm) Weight: 210 lb (95.3 kg) IBW/kg (Calculated) : 77.6  Temp (24hrs), Avg:100 F (37.8 C), Min:99.5 F (37.5 C), Max:100.5 F (38.1 C)   Recent Labs Lab 03/24/16 0638 03/24/16 0710 03/24/16 0731  WBC 18.5*  --   --   CREATININE  --   --  1.25*  LATICACIDVEN  --  2.10*  --     Estimated Creatinine Clearance: 61.2 mL/min (by C-G formula based on SCr of 1.25 mg/dL (H)).    Allergies  Allergen Reactions  . Oxycontin [Oxycodone Hcl] Nausea And Vomiting and Other (See Comments)    Dizziness   . Valsartan-Hydrochlorothiazide Diarrhea    Antimicrobials this admission: 2/5 vancomycin >>  2/5 Zosyn >>   Dose adjustments this admission: ---  Microbiology results: 2/5 BCx: sent 2/5 UCx: sent  2/5 Knee fluid:  sent   Thank you for allowing pharmacy to be a part of this patient's care.  Royetta Asal, PharmD, BCPS Pager [phone] 03/24/2016 11:21 AM

## 2016-03-24 NOTE — ED Provider Notes (Addendum)
Medical screening examination/treatment/procedure(s) were conducted as a shared visit with non-physician practitioner(s) and myself.  I personally evaluated the patient during the encounter.   EKG Interpretation None      76 year old male with atraumatic left knee pain. Progressively worsening since yesterday. He did have a fall yesterday but says that this was secondary to the pain. He has a past history of gout, but has never had a significant issues with his knee. No prior surgical procedures on his knee. Subjective fever and chills. He is febrile in the emergency room. On exam he does not have any increased warmth or erythema but he does have a moderate effusion and significant pain with both active and passive range of motion. Clinical concern for possible septic arthritis, although doesn't seem to have significant underlying factors which may predispose him to this. Will perform arthrocentesis.   Virgel Manifold, MD 03/24/16 [id number]  7:31 AM Synovial fluid turbid. Has leukocytosis of 18,000. Higher clinical suspicion for septic arthritis. Blood cultures already obtained too. Will start abx,     Virgel Manifold, MD 03/24/16 [phone]

## 2016-03-24 NOTE — Consult Note (Signed)
Reason for Consult:left knee pain Referring Physician: [facility]  Zachary Mcdonald is an 76 y.o. male.  HPI: The patient is a 76 y.o. male who presented to the ED with the chief complaint of left knee pain. He has a medical history of arthritis, gout, GERD, hypertension, and chronic lower back pain. He has been seen recently by Dr. Gladstone Lighter for low back pain, which he has recently been on a prednisone taper for. He reports that last night he got up out of a chair and has significant left knee pain which caused his knee to give out and caused him to fall. He reports that he has had a fever since yesterday and has felt some chills. He denies any other recent infection. No open wounds or abrasions. No previous surgery or injections on the left knee. No recent procedures of any kind. He had an aspiration in the ED of his left knee which showed no crystals but elevated WBC.    Past Medical History:  Diagnosis Date  . Arthritis    spine and hands  . Borderline diabetes   . Colon polyps    Tubular Adenoma   . GERD (gastroesophageal reflux disease)   . Gout   . Helicobacter pylori gastritis   . Hyperlipidemia   . Hypertension   . Hyperthyroidism     Past Surgical History:  Procedure Laterality Date  . BACK SURGERY  2010  . BICEPS TENDON REPAIR Right 1999  . MOLE REMOVAL     face  . VASECTOMY  1978    Family History  Problem Relation Age of Onset  . Diabetes Mother   . Colon cancer Brother     27's  . Diabetes Brother     Social History:  reports that he has never smoked. He has never used smokeless tobacco. He reports that he does not drink alcohol or use drugs.  Allergies:  Allergies  Allergen Reactions  . Oxycontin [Oxycodone Hcl] Nausea And Vomiting and Other (See Comments)    Dizziness   . Valsartan-Hydrochlorothiazide Diarrhea    Medications: I have reviewed the patient's current medications.  Results for orders placed or performed during the hospital encounter of 03/24/16  (from the past 48 hour(s))  CBC with Differential     Status: Abnormal   Collection Time: 03/24/16  6:38 AM  Result Value Ref Range   WBC 18.5 (H) 4.0 - 10.5 K/uL   RBC 4.57 4.22 - 5.81 MIL/uL   Hemoglobin 14.1 13.0 - 17.0 g/dL   HCT 39.8 39.0 - 52.0 %   MCV 87.1 78.0 - 100.0 fL   MCH 30.9 26.0 - 34.0 pg   MCHC 35.4 30.0 - 36.0 g/dL   RDW 13.6 11.5 - 15.5 %   Platelets 186 150 - 400 K/uL   Neutrophils Relative % 82 %   Neutro Abs 15.2 (H) 1.7 - 7.7 K/uL   Lymphocytes Relative 9 %   Lymphs Abs 1.6 0.7 - 4.0 K/uL   Monocytes Relative 9 %   Monocytes Absolute 1.8 (H) 0.1 - 1.0 K/uL   Eosinophils Relative 0 %   Eosinophils Absolute 0.0 0.0 - 0.7 K/uL   Basophils Relative 0 %   Basophils Absolute 0.0 0.0 - 0.1 K/uL  Urinalysis, Routine w reflex microscopic     Status: None   Collection Time: 03/24/16  6:48 AM  Result Value Ref Range   Color, Urine YELLOW YELLOW   APPearance CLEAR CLEAR   Specific Gravity, Urine 1.017 1.005 -  1.030   pH 5.0 5.0 - 8.0   Glucose, UA NEGATIVE NEGATIVE mg/dL   Hgb urine dipstick NEGATIVE NEGATIVE   Bilirubin Urine NEGATIVE NEGATIVE   Ketones, ur NEGATIVE NEGATIVE mg/dL   Protein, ur NEGATIVE NEGATIVE mg/dL   Nitrite NEGATIVE NEGATIVE   Leukocytes, UA NEGATIVE NEGATIVE  Synovial cell count + diff, w/ crystals     Status: Abnormal   Collection Time: 03/24/16  7:00 AM  Result Value Ref Range   Color, Synovial YELLOW YELLOW   Appearance-Synovial TURBID (A) CLEAR   Crystals, Fluid NO CRYSTALS SEEN    WBC, Synovial 102,375 (H) 0 - 200 /cu mm   Neutrophil, Synovial 82 (H) 0 - 25 %   Monocyte-Macrophage-Synovial Fluid 17 (L) 50 - 90 %  I-Stat CG4 Lactic Acid, ED     Status: Abnormal   Collection Time: 03/24/16  7:10 AM  Result Value Ref Range   Lactic Acid, Venous 2.10 (HH) 0.5 - 1.9 mmol/L   Comment NOTIFIED PHYSICIAN   Body fluid culture     Status: None (Preliminary result)   Collection Time: 03/24/16  7:30 AM  Result Value Ref Range   Specimen  Description KNEE LEFT    Special Requests NONE    Gram Stain      WBC PRESENT,BOTH PMN AND MONONUCLEAR NO ORGANISMS SEEN CYTOSPIN SMEAR Gram Stain Report Called to,Read Back By and Verified With: ROSSER,M. RN @0859  ON 2.5.18 BY [facility]    Culture PENDING    Report Status PENDING   Comprehensive metabolic panel     Status: Abnormal   Collection Time: 03/24/16  7:31 AM  Result Value Ref Range   Sodium 135 135 - 145 mmol/L   Potassium 3.1 (L) 3.5 - 5.1 mmol/L   Chloride 100 (L) 101 - 111 mmol/L   CO2 28 22 - 32 mmol/L   Glucose, Bld 163 (H) 65 - 99 mg/dL   BUN 31 (H) 6 - 20 mg/dL   Creatinine, Ser 1.25 (H) 0.61 - 1.24 mg/dL   Calcium 8.8 (L) 8.9 - 10.3 mg/dL   Total Protein 6.2 (L) 6.5 - 8.1 g/dL   Albumin 3.8 3.5 - 5.0 g/dL   AST 16 15 - 41 U/L   ALT 22 17 - 63 U/L   Alkaline Phosphatase 44 38 - 126 U/L   Total Bilirubin 1.0 0.3 - 1.2 mg/dL   GFR calc non Af Amer 55 (L) >60 mL/min   GFR calc Af Amer >60 >60 mL/min    Comment: (NOTE) The eGFR has been calculated using the CKD EPI equation. This calculation has not been validated in all clinical situations. eGFR's persistently <60 mL/min signify possible Chronic Kidney Disease.    Anion gap 7 5 - 15    Dg Chest Port 1 View  Result Date: 03/24/2016 CLINICAL DATA:  Fever and chills, tachycardia, septic left knee joint. EXAM: PORTABLE CHEST 1 VIEW COMPARISON:  Portable chest x-ray of June 14, 2015 FINDINGS: The lungs are adequately inflated. There is no focal infiltrate. There is no pleural effusion. The heart is top-normal in size. The pulmonary vascularity is normal. There is calcification in the wall of the aortic arch. The trachea is midline. There is no pleural effusion. The bony thorax exhibits no acute abnormality. IMPRESSION: No acute cardiopulmonary abnormality. Thoracic aortic atherosclerosis. Electronically Signed   By: David  Martinique M.D.   On: 03/24/2016 10:48   Dg Knee Complete 4 Views Left  Result Date:  03/24/2016 CLINICAL DATA:  Patient fell.  Popliteal pain. Unable to bear weight. LEFT leg gave out. EXAM: LEFT KNEE - COMPLETE 4+ VIEW COMPARISON:  None. FINDINGS: No fracture is seen. There is a moderate suprapatellar bursa effusion. Patellar spurs are noted. IMPRESSION: Moderate effusion.  No fracture. Electronically Signed   By: Staci Righter M.D.   On: 03/24/2016 06:37    Review of Systems  Constitutional: Positive for fever. Negative for diaphoresis, malaise/fatigue and weight loss.  HENT: Positive for hearing loss. Negative for congestion, ear discharge, ear pain, nosebleeds, sinus pain, sore throat and tinnitus.   Eyes: Negative.   Respiratory: Positive for shortness of breath. Negative for cough, hemoptysis, sputum production, wheezing and stridor.        SOB with exertion  Cardiovascular: Negative.   Gastrointestinal: Negative.   Genitourinary: Negative for dysuria, flank pain, frequency, hematuria and urgency.       Positive for hesitancy  Musculoskeletal: Positive for back pain, falls, joint pain and myalgias. Negative for neck pain.       Left knee pain  Skin: Negative.   Neurological: Positive for weakness.  Endo/Heme/Allergies: Negative.   Psychiatric/Behavioral: Negative.    Blood pressure 111/64, pulse 84, temperature (S) 100.5 F (38.1 C), resp. rate 23, height 6' (1.829 m), weight 95.3 kg (210 lb), SpO2 95 %. Physical Exam  Constitutional: He is oriented to person, place, and time. He appears well-developed. No distress.  Overweight  HENT:  Head: Normocephalic and atraumatic.  Right Ear: External ear normal.  Left Ear: External ear normal.  Nose: Nose normal.  Mouth/Throat: Oropharynx is clear and moist.  Eyes: Conjunctivae and EOM are normal.  Neck: Normal range of motion. Neck supple.  Cardiovascular: Normal rate, regular rhythm, normal heart sounds and intact distal pulses.   No murmur heard. Respiratory: Effort normal and breath sounds normal. No respiratory  distress. He has no wheezes.  GI: Soft. Bowel sounds are normal. He exhibits no distension. There is no tenderness.  Musculoskeletal:       Right hip: Normal.       Left hip: Normal.       Right knee: Normal.       Left knee: He exhibits decreased range of motion, swelling, effusion and erythema. He exhibits no ecchymosis. Tenderness found. Medial joint line and lateral joint line tenderness noted.  Neurological: He is alert and oriented to person, place, and time. No sensory deficit.  Foot drop right foot from previous lumbar issue; otherwise normal strength  Skin: No rash noted. He is not diaphoretic.  Psychiatric: He has a normal mood and affect. His behavior is normal.    Assessment/Plan: Left knee pain secondary to septic left knee He appears to have a septic left knee at this time, with unknown origin. Will have the patient admitted to the floor by medicine. Will continue lab work to try to find the cause and type of this infection. Plan for arthroscopic irrigation of the left knee by Dr. Gladstone Lighter tomorrow. Patient will be NPO after midnight. Will likely get ID involved to ensure proper antibiotic coverage post-op.   Kieron Kantner, [place] 03/24/2016, 11:18 AM

## 2016-03-24 NOTE — ED Triage Notes (Signed)
Pt c/o knee pain after falling while going to the bathroom at 03:30. Pt had preexisting pain in leg that is now worse, unable to bear weight on leg. Pt reported chills earlier.

## 2016-03-24 NOTE — ED Notes (Signed)
RN contacted 2nd floor to begin 20 minute timer.

## 2016-03-24 NOTE — H&P (Signed)
TRH H&P   Patient Demographics:    Zachary Mcdonald, is a 76 y.o. male  MRN: [id number]   DOB - 03-Jan-1941  Admit Date - 03/24/2016  Outpatient Primary MD for the patient is Tivis Ringer, MD  Referring MD/NP/PA: PA [place]  Outpatient Specialists: ortho Dr Melba Coon  Patient coming from: Home  No chief complaint on file.     HPI:    Zachary Mcdonald  is a 76 y.o. male, With past medical history of arthritis, gout, GERD, hypertension, chronic lower back pain, he presents to ED secondary to left knee pain, chills, patient reports is been having worsening lower back pain over last few weeks, recently seen by Dr. Dellis Filbert, where he was given steroid taper, but he denies any procedure, our injections, reports yesterday he started to have some left knee pain, reports he was trying to stand up from sitting position today, where he fell his knee give out and fell, and was unable to get up from the floor, the shunt reports feeling febrile, having some chills, he denies any chest pain, shortness of breath, nausea vomiting diarrhea, or any respiratory symptoms. - in ED workup significant for fever, tachycardia, leukocytosis, left synovial fluid arthrocentesis significant for leukocytosis of 102 K, with no crystals, received IV vancomycin, and I was called to admit.    Review of systems:    In addition to the HPI above,  Fever and chills No Headache, No changes with Vision or hearing, No problems swallowing food or Liquids, No Chest pain, Cough or Shortness of Breath, No Abdominal pain, No Nausea or Vommitting, Bowel movements are regular, No Blood in stool or Urine, No dysuria, No new skin rashes or bruises, Left knee pain, worsening lower extremity edema No new weakness, tingling, numbness in any extremity, No recent weight gain or loss, No polyuria, polydypsia or polyphagia, No  significant Mental Stressors.  A full 10 point Review of Systems was done, except as stated above, all other Review of Systems were negative.   With Past History of the following :    Past Medical History:  Diagnosis Date  . Arthritis    spine and hands  . Borderline diabetes   . Colon polyps    Tubular Adenoma   . GERD (gastroesophageal reflux disease)   . Gout   . Helicobacter pylori gastritis   . Hyperlipidemia   . Hypertension   . Hyperthyroidism       Past Surgical History:  Procedure Laterality Date  . BACK SURGERY  2010  . BICEPS TENDON REPAIR Right 1999  . MOLE REMOVAL     face  . VASECTOMY  1978      Social History:     Social History  Substance Use Topics  . Smoking status: Never Smoker  . Smokeless tobacco: Never Used  . Alcohol use No     Lives - At home with  wife  Mobility - independent     Family History :     Family History  Problem Relation Age of Onset  . Diabetes Mother   . Colon cancer Brother     70's  . Diabetes Brother       Home Medications:   Prior to Admission medications   Medication Sig Start Date End Date Taking? Authorizing Provider  acetaminophen (TYLENOL) 500 MG tablet Take 1,000 mg by mouth every 6 (six) hours as needed.   Yes Historical Provider, MD  allopurinol (ZYLOPRIM) 300 MG tablet Take 300 mg by mouth daily.   Yes Historical Provider, MD  BENICAR HCT 40-25 MG tablet Take 1 tablet by mouth daily.  06/16/15  Yes Historical Provider, MD  bismuth subsalicylate (PEPTO BISMOL) 262 MG chewable tablet Chew 1 tablet (262 mg total) by mouth 4 (four) times daily -  before meals and at bedtime. Patient taking differently: Chew 262 mg by mouth 3 (three) times daily as needed for indigestion.  12/05/15  Yes Jerene Bears, MD  diltiazem (TIAZAC) 420 MG 24 hr capsule Take 420 mg by mouth daily.   Yes Historical Provider, MD  fish oil-omega-3 fatty acids 1000 MG capsule Take 2 g by mouth daily.   Yes Historical Provider, MD    gabapentin (NEURONTIN) 300 MG capsule Take 300 mg by mouth 3 (three) times daily. 03/17/16  Yes Historical Provider, MD  levothyroxine (SYNTHROID, LEVOTHROID) 50 MCG tablet Take 50 mcg by mouth daily before breakfast.   Yes Historical Provider, MD  oxyCODONE-acetaminophen (PERCOCET) 10-325 MG tablet Take 1 tablet by mouth every 4 (four) hours as needed for pain. 03/17/16  Yes Historical Provider, MD  predniSONE (DELTASONE) 10 MG tablet Take 10 mg by mouth. Take one tablet by mouth tid x 2 days, then one tablet bid x5 days then one tablet daily until gone. 03/18/16  Yes Historical Provider, MD  simvastatin (ZOCOR) 40 MG tablet Take 40 mg by mouth every evening.   Yes Historical Provider, MD  tamsulosin (FLOMAX) 0.4 MG CAPS capsule Take 0.4 mg by mouth daily.  04/05/15  Yes Historical Provider, MD  amoxicillin (AMOXIL) 500 MG tablet Take 2 tablets (1,000 mg total) by mouth 2 (two) times daily. Patient not taking: Reported on 03/24/2016 09/25/15   Jerene Bears, MD  clarithromycin (BIAXIN) 500 MG tablet Take 1 tablet (500 mg total) by mouth 2 (two) times daily. Patient not taking: Reported on 03/24/2016 09/25/15   Jerene Bears, MD  metroNIDAZOLE (FLAGYL) 250 MG tablet Take 1 tablet (250 mg total) by mouth 4 (four) times daily. Patient not taking: Reported on 03/24/2016 12/05/15   Jerene Bears, MD  omeprazole (PRILOSEC) 20 MG capsule Take 1 capsule (20 mg total) by mouth daily. Patient not taking: Reported on 03/24/2016 06/15/15   Varney Biles, MD  omeprazole (PRILOSEC) 20 MG capsule Take 1 capsule (20 mg total) by mouth 2 (two) times daily before a meal. Patient not taking: Reported on 03/24/2016 09/25/15   Jerene Bears, MD  omeprazole (PRILOSEC) 20 MG capsule Take 1 capsule (20 mg total) by mouth 2 (two) times daily before a meal. Patient not taking: Reported on 03/24/2016 12/05/15   Jerene Bears, MD  tetracycline (ACHROMYCIN,SUMYCIN) 500 MG capsule Take 1 capsule (500 mg total) by mouth 4 (four) times daily. Patient  not taking: Reported on 03/24/2016 12/05/15   Jerene Bears, MD     Allergies:     Allergies  Allergen Reactions  .  Oxycontin [Oxycodone Hcl] Nausea And Vomiting and Other (See Comments)    Dizziness   . Valsartan-Hydrochlorothiazide Diarrhea     Physical Exam:   Vitals  Blood pressure 111/64, pulse 84, temperature (S) 100.5 F (38.1 C), resp. rate 23, SpO2 95 %.   1. GeneralDeveloped elderly male lying in bed in mild discomfort.  2. Normal affect and insight, Not Suicidal or Homicidal, Awake Alert, Oriented X 3.  3. No F.N deficits, ALL C.Nerves Intact, Strength 5/5 all 4 extremities, Sensation intact all 4 extremities, Plantars down going.  4. Ears and Eyes appear Normal, Conjunctivae clear, PERRLA.Dry Oral Mucosa.  5. Supple Neck, No JVD, No cervical lymphadenopathy appriciated, No Carotid Bruits.  6. Symmetrical Chest wall movement, Good air movement bilaterally, CTAB.  7. Tachycardic No Gallops, Rubs or Murmurs, No Parasternal Heave.  8. Positive Bowel Sounds, Abdomen Soft, No tenderness, No organomegaly appriciated,No rebound -guarding or rigidity.  9.  No Cyanosis, Normal Skin Turgor, No Skin Rash or Bruise.  10. Good muscle tone,  joints appear normal , Left knee with minimal effusion, limited range of motion secondary to pain, chronic right foot drop, tenderness to palpation lower back area extended 8-10 cm bilaterally.  11. No Palpable Lymph Nodes in Neck or Axillae     Data Review:    CBC  Recent Labs Lab 03/24/16 0638  WBC 18.5*  HGB 14.1  HCT 39.8  PLT 186  MCV 87.1  MCH 30.9  MCHC 35.4  RDW 13.6  LYMPHSABS 1.6  MONOABS 1.8*  EOSABS 0.0  BASOSABS 0.0   ------------------------------------------------------------------------------------------------------------------  Chemistries   Recent Labs Lab 03/24/16 0731  NA 135  K 3.1*  CL 100*  CO2 28  GLUCOSE 163*  BUN 31*  CREATININE 1.25*  CALCIUM 8.8*  AST 16  ALT 22  ALKPHOS 44    BILITOT 1.0   ------------------------------------------------------------------------------------------------------------------ CrCl cannot be calculated (Unknown ideal weight.). ------------------------------------------------------------------------------------------------------------------ No results for input(s): TSH, T4TOTAL, T3FREE, THYROIDAB in the last 72 hours.  Invalid input(s): FREET3  Coagulation profile No results for input(s): INR, PROTIME in the last 168 hours. ------------------------------------------------------------------------------------------------------------------- No results for input(s): DDIMER in the last 72 hours. -------------------------------------------------------------------------------------------------------------------  Cardiac Enzymes No results for input(s): CKMB, TROPONINI, MYOGLOBIN in the last 168 hours.  Invalid input(s): CK ------------------------------------------------------------------------------------------------------------------ No results found for: BNP   ---------------------------------------------------------------------------------------------------------------  Urinalysis    Component Value Date/Time   COLORURINE YELLOW 03/24/2016 [place] 03/24/2016 0648   LABSPEC 1.017 03/24/2016 0648   PHURINE 5.0 03/24/2016 0648   GLUCOSEU NEGATIVE 03/24/2016 0648   HGBUR NEGATIVE 03/24/2016 0648   [place] NEGATIVE 03/24/2016 0648   KETONESUR NEGATIVE 03/24/2016 0648   PROTEINUR NEGATIVE 03/24/2016 0648   NITRITE NEGATIVE 03/24/2016 0648   LEUKOCYTESUR NEGATIVE 03/24/2016 0648    ----------------------------------------------------------------------------------------------------------------   Imaging Results:    Dg Knee Complete 4 Views Left  Result Date: 03/24/2016 CLINICAL DATA:  Patient fell. Popliteal pain. Unable to bear weight. LEFT leg gave out. EXAM: LEFT KNEE - COMPLETE 4+ VIEW COMPARISON:   None. FINDINGS: No fracture is seen. There is a moderate suprapatellar bursa effusion. Patellar spurs are noted. IMPRESSION: Moderate effusion.  No fracture. Electronically Signed   By: Staci Righter M.D.   On: 03/24/2016 06:37    My personal review of EKG: Rhythm Sinus tachycardia, Rate 106  /min, QTc 388, no Acute ST changes   Assessment & Plan:    Active Problems:   Septic joint of left knee joint (HCC)   Hypertension  Hypothyroidism  Sepsis secondary to left knee septic joint - Patient presents with fever, tachycardia and leukocytosis with elevated lactic acid - Follow on urine analysis, urine culture and blood cultures - Septic left knee, status post arthrocentesis in ED with 102000 blood cells, follow on Gram stain and cultures, continue with IV vancomycin and Zosyn, Dr. Dellis Filbert consulted by ED, plan for knee wash tomorrow discussion with ED, keep nothing by mouth after midnight. - Patient with worsening lower back pain, will obtain MRI  lumbar spine to include sacrum to rule out discitis or infectious process.  Hypertension - Patient with soft blood pressure despite fluid boluses, will admit to stepdown, continue with IV fluids, continue to hold medications  Hypothyroidism - Continue Synthroid  Hypokalemia - Repleted, recheck in a.m.Marland Kitchen  DVT Prophylaxis Heparin -   SCDs  AM Labs Ordered, also please review Full Orders  Family Communication: Admission, patients condition and plan of care including tests being ordered have been discussed with the patient and wife who indicate understanding and agree with the plan and Code Status.  Code Status Full  Likely DC to  : pending PT  Condition GUARDED    Consults called: ortho Dr Melba Coon consulted byED  Admission status: inpatient  Time spent in minutes : 65 minutes   Ming Kunka M.D on 03/24/2016 at 10:25 AM  Between 7am to 7pm - Pager - [phone]. After 7pm go to [email or web address] - password [facility]  [facility]  - Office  [phone]

## 2016-03-24 NOTE — ED Notes (Signed)
hospitalist at bedside

## 2016-03-24 NOTE — ED Notes (Signed)
Pt is out in xray will get labs when return to room.

## 2016-03-24 NOTE — ED Notes (Signed)
RN AND MD NOTIFIED OF PATIENT'S LACTIC ACID OF 2.10

## 2016-03-24 NOTE — ED Provider Notes (Signed)
[facility] DEPT Provider Note   CSN: [id number] Arrival date & time: 03/24/16  0530     History   Chief Complaint No chief complaint on file.   HPI Zachary Mcdonald is a 76 y.o. male.  HPI Zachary Mcdonald is a 76 y.o. male with hx of arthritis, gout, GERD, HTN, presents to ED with complaint of left knee pain and chills. Pt states his pain began yesterday while sitting at a computer. Pt states he thought maybe he sat in same position too long. Reports no prior knee problems or pain in the past. States had chills prior to going to bed. Did not check temp. This morning around 3:30 am got up to use the bathroom and fell because "my knee just gave out." Reports he was unable to get up off the floor. Wife called ems. Pt states he did not hit his head during the fall. Denies LOC. States having chronic pain in lower back for which he saw Dr. Cay Schillings last week. Reports severe new pain in left knee and generalized malaise.  No cough, congestion, URI symptoms. No tx prior to coming in.   Past Medical History:  Diagnosis Date  . Arthritis    spine and hands  . Borderline diabetes   . Colon polyps    Tubular Adenoma   . GERD (gastroesophageal reflux disease)   . Gout   . Helicobacter pylori gastritis   . Hyperlipidemia   . Hypertension   . Hyperthyroidism     Patient Active Problem List   Diagnosis Date Noted  . Abdominal pain, other specified site 06/01/2012    Past Surgical History:  Procedure Laterality Date  . BACK SURGERY  2010  . BICEPS TENDON REPAIR Right 1999  . MOLE REMOVAL     face  . VASECTOMY  1978       Home Medications    Prior to Admission medications   Medication Sig Start Date End Date Taking? Authorizing Provider  acetaminophen (TYLENOL) 500 MG tablet Take 1,000 mg by mouth every 6 (six) hours as needed.   Yes Historical Provider, MD  allopurinol (ZYLOPRIM) 300 MG tablet Take 300 mg by mouth daily.   Yes Historical Provider, MD  BENICAR HCT 40-25 MG  tablet Take 1 tablet by mouth daily.  06/16/15  Yes Historical Provider, MD  bismuth subsalicylate (PEPTO BISMOL) 262 MG chewable tablet Chew 1 tablet (262 mg total) by mouth 4 (four) times daily -  before meals and at bedtime. Patient taking differently: Chew 262 mg by mouth 3 (three) times daily as needed for indigestion.  12/05/15  Yes Jerene Bears, MD  diltiazem (TIAZAC) 420 MG 24 hr capsule Take 420 mg by mouth daily.   Yes Historical Provider, MD  fish oil-omega-3 fatty acids 1000 MG capsule Take 2 g by mouth daily.   Yes Historical Provider, MD  gabapentin (NEURONTIN) 300 MG capsule Take 300 mg by mouth 3 (three) times daily. 03/17/16  Yes Historical Provider, MD  levothyroxine (SYNTHROID, LEVOTHROID) 50 MCG tablet Take 50 mcg by mouth daily before breakfast.   Yes Historical Provider, MD  oxyCODONE-acetaminophen (PERCOCET) 10-325 MG tablet Take 1 tablet by mouth every 4 (four) hours as needed for pain. 03/17/16  Yes Historical Provider, MD  predniSONE (DELTASONE) 10 MG tablet Take 10 mg by mouth. Take one tablet by mouth tid x 2 days, then one tablet bid x5 days then one tablet daily until gone. 03/18/16  Yes Historical Provider, MD  simvastatin (ZOCOR)  40 MG tablet Take 40 mg by mouth every evening.   Yes Historical Provider, MD  tamsulosin (FLOMAX) 0.4 MG CAPS capsule Take 0.4 mg by mouth daily.  04/05/15  Yes Historical Provider, MD  amoxicillin (AMOXIL) 500 MG tablet Take 2 tablets (1,000 mg total) by mouth 2 (two) times daily. Patient not taking: Reported on 03/24/2016 09/25/15   Jerene Bears, MD  clarithromycin (BIAXIN) 500 MG tablet Take 1 tablet (500 mg total) by mouth 2 (two) times daily. Patient not taking: Reported on 03/24/2016 09/25/15   Jerene Bears, MD  metroNIDAZOLE (FLAGYL) 250 MG tablet Take 1 tablet (250 mg total) by mouth 4 (four) times daily. Patient not taking: Reported on 03/24/2016 12/05/15   Jerene Bears, MD  omeprazole (PRILOSEC) 20 MG capsule Take 1 capsule (20 mg total) by mouth  daily. Patient not taking: Reported on 03/24/2016 06/15/15   Varney Biles, MD  omeprazole (PRILOSEC) 20 MG capsule Take 1 capsule (20 mg total) by mouth 2 (two) times daily before a meal. Patient not taking: Reported on 03/24/2016 09/25/15   Jerene Bears, MD  omeprazole (PRILOSEC) 20 MG capsule Take 1 capsule (20 mg total) by mouth 2 (two) times daily before a meal. Patient not taking: Reported on 03/24/2016 12/05/15   Jerene Bears, MD  tetracycline (ACHROMYCIN,SUMYCIN) 500 MG capsule Take 1 capsule (500 mg total) by mouth 4 (four) times daily. Patient not taking: Reported on 03/24/2016 12/05/15   Jerene Bears, MD    Family History Family History  Problem Relation Age of Onset  . Diabetes Mother   . Colon cancer Brother     63's  . Diabetes Brother     Social History Social History  Substance Use Topics  . Smoking status: Never Smoker  . Smokeless tobacco: Never Used  . Alcohol use No     Allergies   Oxycontin [oxycodone hcl] and Valsartan-hydrochlorothiazide   Review of Systems Review of Systems  Constitutional: Positive for chills and fever.  HENT: Negative for congestion and sore throat.   Respiratory: Negative for cough, chest tightness and shortness of breath.   Cardiovascular: Negative for chest pain, palpitations and leg swelling.  Gastrointestinal: Negative for abdominal distention, abdominal pain, diarrhea, nausea and vomiting.  Genitourinary: Negative for dysuria, frequency, hematuria and urgency.  Musculoskeletal: Positive for arthralgias, back pain, joint swelling and myalgias. Negative for neck pain and neck stiffness.  Skin: Negative for rash.  Allergic/Immunologic: Negative for immunocompromised state.  Neurological: Negative for dizziness, weakness, light-headedness, numbness and headaches.  All other systems reviewed and are negative.    Physical Exam Updated Vital Signs BP 134/76   Pulse 111   Temp 99.5 F (37.5 C) (Oral)   Resp 22   SpO2 95%    Physical Exam  Constitutional: He appears well-developed and well-nourished. No distress.  HENT:  Head: Normocephalic and atraumatic.  Eyes: Conjunctivae are normal.  Neck: Neck supple.  Cardiovascular: Normal rate, regular rhythm and normal heart sounds.   Pulmonary/Chest: Effort normal. No respiratory distress. He has no wheezes. He has no rales.  Abdominal: Soft. Bowel sounds are normal. He exhibits no distension. There is no tenderness. There is no rebound.  Musculoskeletal: He exhibits no edema.  Left knee effusion noted. Knee warm to the touch. Pain with any ROM of the knee joint. Distal DP pulses intact.   Neurological: He is alert.  Skin: Skin is warm and dry.  Nursing note and vitals reviewed.    ED Treatments /  Results  Labs (all labs ordered are listed, but only abnormal results are displayed) Labs Reviewed  CULTURE, BLOOD (ROUTINE X 2)  CULTURE, BLOOD (ROUTINE X 2)  CBC WITH DIFFERENTIAL/PLATELET  COMPREHENSIVE METABOLIC PANEL  URINALYSIS, ROUTINE W REFLEX MICROSCOPIC  I-STAT CG4 LACTIC ACID, ED    EKG  EKG Interpretation None       Radiology No results found.  Procedures .Joint Aspiration/Arthrocentesis Date/Time: 03/24/2016 7:28 AM Performed by: Jeannett Senior Authorized by: Jeannett Senior   Consent:    Consent obtained:  Verbal   Consent given by:  Patient   Risks discussed:  Bleeding, infection and pain   Alternatives discussed:  No treatment and delayed treatment Location:    Location:  Knee   Knee:  L knee Anesthesia (see MAR for exact dosages):    Anesthesia method:  Local infiltration   Local anesthetic:  Lidocaine 1% WITH epi Procedure details:    Preparation: Patient was prepped and draped in usual sterile fashion     Needle gauge:  18 G   Ultrasound guidance: no     Approach:  Lateral   Aspirate characteristics:  Cloudy and yellow   Steroid injected: no     Specimen collected: yes   Post-procedure details:     Dressing:  Adhesive bandage   Patient tolerance of procedure:  Tolerated well, no immediate complications   (including critical care time)  Medications Ordered in ED   Initial Impression / Assessment and Plan / ED Course  I have reviewed the triage vital signs and the nursing notes.  Pertinent labs & imaging results that were available during my care of the patient were reviewed by me and considered in my medical decision making (see chart for details).    Pt with suspected left knee infection, fever, tachycardia. Will start IV fluids. Will perform joint arthrocentesis. Tylenol ordered for fever. Labs pending.     40cc of yellowish-white, thick, cloudy fluid aspirated from left knee. Sent for analysis. Pt continues to be tachycardic and very solmnolent. Will make code sepsis. Continue IV fluids. Ancef and vanc started.    Discussed with Dr. Gladstone Lighter, orthopedics specialist. Greater than 100,000 WBC in synovial fluid, high suspicion for septic joint. Asked for admission and he will see him today and most likely perform surgery in AM.    Discussed with hospitalist, will admit.   Vitals:   03/24/16 1040 03/24/16 1100 03/24/16 1229 03/24/16 1334  BP:  118/58 123/63 110/61  Pulse:  73 77 73  Resp:  14 20 18   Temp:    99 F (37.2 C)  TempSrc:    Oral  SpO2:  93% 93% 97%  Weight: 95.3 kg     Height: 6' (1.829 m)        Final Clinical Impressions(s) / ED Diagnoses   Final diagnoses:  Pyogenic arthritis of left knee joint, due to unspecified organism [facility])    New Prescriptions Current Discharge Medication List       Jeannett Senior, PA-C 03/24/16 [address], PA-C 03/24/16 1517    Virgel Manifold, MD 03/26/16 1426

## 2016-03-24 NOTE — Progress Notes (Signed)
Subjective:   Procedure(s) (LRB): ARTHROSCOPIC WASHOUT OF LEFT KNEE (Left) Patient reports pain as 4 on 0-10 scale. Left Knee pain and swellingWBC 20.5 His wife said that he had a cough last week.  Objective: Vital signs in last 24 hours: Temp:  [99 F (37.2 C)-100.5 F (38.1 C)] 99 F (37.2 C) (02/05 1334) Pulse Rate:  [73-112] 73 (02/05 1334) Resp:  [14-28] 18 (02/05 1334) BP: (88-134)/(48-76) 110/61 (02/05 1334) SpO2:  [91 %-97 %] 97 % (02/05 1334) Weight:  [95.3 kg (210 lb)] 95.3 kg (210 lb) (02/05 1040)  Intake/Output from previous day: No intake/output data recorded. Intake/Output this shift: Total I/O In: 4250 [I.V.:1000; IV Piggyback:3250] Out: 720 [Urine:720]   Recent Labs  03/24/16 0638 03/24/16 1305  HGB 14.1 13.2    Recent Labs  03/24/16 0638 03/24/16 1305  WBC 18.5* 20.5*  RBC 4.57 4.05*  HCT 39.8 36.5*  PLT 186 166    Recent Labs  03/24/16 0731 03/24/16 1305  NA 135  --   K 3.1*  --   CL 100*  --   CO2 28  --   BUN 31*  --   CREATININE 1.25* 1.11  GLUCOSE 163*  --   CALCIUM 8.8*  --    No results for input(s): LABPT, INR in the last 72 hours.  Painful,Swollen Left Knee,of sudden onset.Aspiration of left knee rwevealed cloudy fluid which was sent to the Lab for cultures.  Assessment/Plan:   Procedure(s) (LRB): ARTHROSCOPIC WASHOUT OF LEFT KNEE (Left)   Dinara Lupu A 03/24/2016, 1:38 PM

## 2016-03-25 ENCOUNTER — Encounter (HOSPITAL_COMMUNITY)
Admission: EM | Disposition: A | Discharge status: Home Health | Payer: Self-pay | Source: Home / Self Care | Attending: Internal Medicine

## 2016-03-25 ENCOUNTER — Inpatient Hospital Stay (HOSPITAL_COMMUNITY): Payer: Medicare Other | Admitting: Anesthesiology

## 2016-03-25 ENCOUNTER — Encounter (HOSPITAL_COMMUNITY): Payer: Self-pay | Admitting: Certified Registered"

## 2016-03-25 DIAGNOSIS — M71162 Other infective bursitis, left knee: Secondary | ICD-10-CM

## 2016-03-25 DIAGNOSIS — B9689 Other specified bacterial agents as the cause of diseases classified elsewhere: Secondary | ICD-10-CM | POA: Insufficient documentation

## 2016-03-25 HISTORY — PX: KNEE ARTHROSCOPY: SHX127

## 2016-03-25 LAB — BLOOD CULTURE ID PANEL (REFLEXED)
ACINETOBACTER BAUMANNII: NOT DETECTED
CANDIDA ALBICANS: NOT DETECTED
Candida glabrata: NOT DETECTED
Candida krusei: NOT DETECTED
Candida parapsilosis: NOT DETECTED
Candida tropicalis: NOT DETECTED
ENTEROBACTERIACEAE SPECIES: NOT DETECTED
ENTEROCOCCUS SPECIES: NOT DETECTED
Enterobacter cloacae complex: NOT DETECTED
Escherichia coli: NOT DETECTED
HAEMOPHILUS INFLUENZAE: NOT DETECTED
KLEBSIELLA OXYTOCA: NOT DETECTED
Klebsiella pneumoniae: NOT DETECTED
LISTERIA MONOCYTOGENES: NOT DETECTED
NEISSERIA MENINGITIDIS: NOT DETECTED
PSEUDOMONAS AERUGINOSA: NOT DETECTED
Proteus species: NOT DETECTED
STREPTOCOCCUS AGALACTIAE: NOT DETECTED
STREPTOCOCCUS PNEUMONIAE: NOT DETECTED
STREPTOCOCCUS PYOGENES: NOT DETECTED
STREPTOCOCCUS SPECIES: DETECTED — AB
Serratia marcescens: NOT DETECTED
Staphylococcus aureus (BCID): NOT DETECTED
Staphylococcus species: NOT DETECTED

## 2016-03-25 LAB — CBC
HCT: 34.7 % — ABNORMAL LOW (ref 39.0–52.0)
Hemoglobin: 12 g/dL — ABNORMAL LOW (ref 13.0–17.0)
MCH: 31.3 pg (ref 26.0–34.0)
MCHC: 34.6 g/dL (ref 30.0–36.0)
MCV: 90.4 fL (ref 78.0–100.0)
PLATELETS: 135 10*3/uL — AB (ref 150–400)
RBC: 3.84 MIL/uL — AB (ref 4.22–5.81)
RDW: 14.4 % (ref 11.5–15.5)
WBC: 12.1 10*3/uL — AB (ref 4.0–10.5)

## 2016-03-25 LAB — COMPREHENSIVE METABOLIC PANEL
ALT: 27 U/L (ref 17–63)
ALT: 28 U/L (ref 17–63)
ANION GAP: 9 (ref 5–15)
AST: 52 U/L — ABNORMAL HIGH (ref 15–41)
AST: 47 U/L — ABNORMAL HIGH (ref 15–41)
Albumin: 3 g/dL — ABNORMAL LOW (ref 3.5–5.0)
Albumin: 2.9 g/dL — ABNORMAL LOW (ref 3.5–5.0)
Alkaline Phosphatase: 37 U/L — ABNORMAL LOW (ref 38–126)
Alkaline Phosphatase: 40 U/L (ref 38–126)
Anion gap: 8 (ref 5–15)
BUN: 32 mg/dL — ABNORMAL HIGH (ref 6–20)
BUN: 40 mg/dL — ABNORMAL HIGH (ref 6–20)
CHLORIDE: 114 mmol/L — AB (ref 101–111)
CO2: 20 mmol/L — ABNORMAL LOW (ref 22–32)
CO2: 17 mmol/L — AB (ref 22–32)
Calcium: 8.2 mg/dL — ABNORMAL LOW (ref 8.9–10.3)
Calcium: 8 mg/dL — ABNORMAL LOW (ref 8.9–10.3)
Chloride: 109 mmol/L (ref 101–111)
Creatinine, Ser: 2.59 mg/dL — ABNORMAL HIGH (ref 0.61–1.24)
Creatinine, Ser: 3.05 mg/dL — ABNORMAL HIGH (ref 0.61–1.24)
GFR calc Af Amer: 26 mL/min — ABNORMAL LOW (ref >60)
GFR calc non Af Amer: 23 mL/min — ABNORMAL LOW (ref >60)
GFR calc non Af Amer: 19 mL/min — ABNORMAL LOW (ref >60)
GFR, EST AFRICAN AMERICAN: 22 mL/min — AB (ref >60)
Glucose, Bld: 154 mg/dL — ABNORMAL HIGH (ref 65–99)
Glucose, Bld: 149 mg/dL — ABNORMAL HIGH (ref 65–99)
POTASSIUM: 4.6 mmol/L (ref 3.5–5.1)
Potassium: 4.4 mmol/L (ref 3.5–5.1)
SODIUM: 140 mmol/L (ref 135–145)
Sodium: 137 mmol/L (ref 135–145)
Total Bilirubin: 1.6 mg/dL — ABNORMAL HIGH (ref 0.3–1.2)
Total Bilirubin: 1 mg/dL (ref 0.3–1.2)
Total Protein: 5.3 g/dL — ABNORMAL LOW (ref 6.5–8.1)
Total Protein: 5.5 g/dL — ABNORMAL LOW (ref 6.5–8.1)

## 2016-03-25 LAB — CBC WITH DIFFERENTIAL/PLATELET
Basophils Absolute: 0 10*3/uL (ref 0.0–0.1)
Basophils Relative: 0 %
Eosinophils Absolute: 0 10*3/uL (ref 0.0–0.7)
Eosinophils Relative: 0 %
HCT: 35 % — ABNORMAL LOW (ref 39.0–52.0)
Hemoglobin: 12.5 g/dL — ABNORMAL LOW (ref 13.0–17.0)
Lymphocytes Relative: 9 %
Lymphs Abs: 1.4 10*3/uL (ref 0.7–4.0)
MCH: 32.6 pg (ref 26.0–34.0)
MCHC: 35.7 g/dL (ref 30.0–36.0)
MCV: 91.1 fL (ref 78.0–100.0)
Monocytes Absolute: 1.5 10*3/uL — ABNORMAL HIGH (ref 0.1–1.0)
Monocytes Relative: 9 %
Neutro Abs: 12.9 10*3/uL — ABNORMAL HIGH (ref 1.7–7.7)
Neutrophils Relative %: 82 %
Platelets: 139 10*3/uL — ABNORMAL LOW (ref 150–400)
RBC: 3.84 MIL/uL — ABNORMAL LOW (ref 4.22–5.81)
RDW: 14.3 % (ref 11.5–15.5)
WBC: 15.8 10*3/uL — ABNORMAL HIGH (ref 4.0–10.5)

## 2016-03-25 LAB — CREATININE, SERUM
CREATININE: 3.11 mg/dL — AB (ref 0.61–1.24)
GFR, EST AFRICAN AMERICAN: 21 mL/min — AB (ref >60)
GFR, EST NON AFRICAN AMERICAN: 18 mL/min — AB (ref >60)

## 2016-03-25 LAB — URINE CULTURE

## 2016-03-25 LAB — PROTIME-INR
INR: 1.36
Prothrombin Time: 16.9 seconds — ABNORMAL HIGH (ref 11.4–15.2)

## 2016-03-25 LAB — VANCOMYCIN, TROUGH: Vancomycin Tr: 35 ug/mL (ref 15–20)

## 2016-03-25 SURGERY — ARTHROSCOPY, KNEE
Anesthesia: General | Site: Knee | Laterality: Left

## 2016-03-25 MED ORDER — METRONIDAZOLE 500 MG PO TABS: 250.0000 mg | ORAL_TABLET | Freq: Four times a day (QID) | ORAL | Status: DC

## 2016-03-25 MED ORDER — GABAPENTIN 300 MG PO CAPS
300.0000 mg | ORAL_CAPSULE | Freq: Three times a day (TID) | ORAL | Status: DC
  Administered 2016-03-25 – 2016-03-26 (×2): 300 mg via ORAL
  Filled 2016-03-25 (×2): qty 1

## 2016-03-25 MED ORDER — LACTATED RINGERS IV SOLN: INTRAVENOUS | Status: DC

## 2016-03-25 MED ORDER — SODIUM CHLORIDE 0.9 % IV BOLUS (SEPSIS)
500.0000 mL | Freq: Once | INTRAVENOUS | Status: AC
  Administered 2016-03-25: 500 mL via INTRAVENOUS

## 2016-03-25 MED ORDER — FLEET ENEMA 7-19 GM/118ML RE ENEM: 1.0000 | ENEMA | Freq: Once | RECTAL | Status: DC | PRN

## 2016-03-25 MED ORDER — PANTOPRAZOLE SODIUM 40 MG PO TBEC
40.0000 mg | DELAYED_RELEASE_TABLET | Freq: Every day | ORAL | Status: DC
  Administered 2016-03-26 – 2016-03-28 (×3): 40 mg via ORAL
  Filled 2016-03-25 (×3): qty 1

## 2016-03-25 MED ORDER — ACETAMINOPHEN 325 MG PO TABS: 650.0000 mg | ORAL_TABLET | Freq: Four times a day (QID) | ORAL | Status: DC | PRN

## 2016-03-25 MED ORDER — IRBESARTAN 150 MG PO TABS: 300.0000 mg | ORAL_TABLET | Freq: Every day | ORAL | Status: DC

## 2016-03-25 MED ORDER — FENTANYL CITRATE (PF) 100 MCG/2ML IJ SOLN
25.0000 ug | INTRAMUSCULAR | Status: DC | PRN
  Administered 2016-03-25 (×4): 25 ug via INTRAVENOUS
  Administered 2016-03-25: 50 ug via INTRAVENOUS

## 2016-03-25 MED ORDER — METHOCARBAMOL 1000 MG/10ML IJ SOLN
500.0000 mg | Freq: Four times a day (QID) | INTRAVENOUS | Status: DC | PRN
  Filled 2016-03-25: qty 5

## 2016-03-25 MED ORDER — POLYETHYLENE GLYCOL 3350 17 G PO PACK: 17.0000 g | PACK | Freq: Every day | ORAL | Status: DC | PRN

## 2016-03-25 MED ORDER — ACETAMINOPHEN 650 MG RE SUPP: 650.0000 mg | Freq: Four times a day (QID) | RECTAL | Status: DC | PRN

## 2016-03-25 MED ORDER — HYDROMORPHONE HCL 2 MG PO TABS
2.0000 mg | ORAL_TABLET | ORAL | Status: DC | PRN
  Administered 2016-03-27: 2 mg via ORAL
  Filled 2016-03-25: qty 1

## 2016-03-25 MED ORDER — BISMUTH SUBSALICYLATE 262 MG PO CHEW
262.0000 mg | CHEWABLE_TABLET | Freq: Three times a day (TID) | ORAL | Status: DC | PRN
  Administered 2016-03-25: 262 mg via ORAL
  Filled 2016-03-25 (×3): qty 1

## 2016-03-25 MED ORDER — DILTIAZEM HCL ER BEADS 300 MG PO CP24: 420.0000 mg | ORAL_CAPSULE | Freq: Every day | ORAL | Status: DC

## 2016-03-25 MED ORDER — FERROUS SULFATE 325 (65 FE) MG PO TABS
325.0000 mg | ORAL_TABLET | Freq: Three times a day (TID) | ORAL | Status: DC
  Administered 2016-03-27 – 2016-03-28 (×5): 325 mg via ORAL
  Filled 2016-03-25 (×7): qty 1

## 2016-03-25 MED ORDER — FENTANYL CITRATE (PF) 100 MCG/2ML IJ SOLN
INTRAMUSCULAR | Status: AC
  Administered 2016-03-25: 25 ug via INTRAVENOUS
  Filled 2016-03-25: qty 2

## 2016-03-25 MED ORDER — CHLORHEXIDINE GLUCONATE CLOTH 2 % EX PADS
6.0000 | MEDICATED_PAD | Freq: Every day | CUTANEOUS | Status: DC
  Administered 2016-03-25 – 2016-03-27 (×3): 6 via TOPICAL

## 2016-03-25 MED ORDER — TAMSULOSIN HCL 0.4 MG PO CAPS
0.4000 mg | ORAL_CAPSULE | Freq: Every day | ORAL | Status: DC
  Administered 2016-03-25 – 2016-03-26 (×2): 0.4 mg via ORAL
  Filled 2016-03-25 (×2): qty 1

## 2016-03-25 MED ORDER — DILTIAZEM HCL ER COATED BEADS 240 MG PO CP24
420.0000 mg | ORAL_CAPSULE | Freq: Every day | ORAL | Status: DC
  Administered 2016-03-26 – 2016-03-28 (×3): 420 mg via ORAL
  Filled 2016-03-25 (×3): qty 1

## 2016-03-25 MED ORDER — ROCURONIUM BROMIDE 10 MG/ML (PF) SYRINGE
PREFILLED_SYRINGE | INTRAVENOUS | Status: DC | PRN
  Administered 2016-03-25: 30 mg via INTRAVENOUS

## 2016-03-25 MED ORDER — METOCLOPRAMIDE HCL 5 MG PO TABS: 5.0000 mg | ORAL_TABLET | Freq: Three times a day (TID) | ORAL | Status: DC | PRN

## 2016-03-25 MED ORDER — PHENYLEPHRINE 40 MCG/ML (10ML) SYRINGE FOR IV PUSH (FOR BLOOD PRESSURE SUPPORT)
PREFILLED_SYRINGE | INTRAVENOUS | Status: DC | PRN
  Administered 2016-03-25 (×4): 80 ug via INTRAVENOUS
  Administered 2016-03-25: 120 ug via INTRAVENOUS

## 2016-03-25 MED ORDER — HYDROMORPHONE HCL 2 MG/ML IJ SOLN: 0.5000 mg | INTRAMUSCULAR | Status: DC | PRN

## 2016-03-25 MED ORDER — ONDANSETRON HCL 4 MG PO TABS: 4.0000 mg | ORAL_TABLET | Freq: Four times a day (QID) | ORAL | Status: DC | PRN

## 2016-03-25 MED ORDER — ONDANSETRON HCL 4 MG/2ML IJ SOLN
INTRAMUSCULAR | Status: DC | PRN
  Administered 2016-03-25: 4 mg via INTRAVENOUS

## 2016-03-25 MED ORDER — METHOCARBAMOL 500 MG PO TABS: 500.0000 mg | ORAL_TABLET | Freq: Four times a day (QID) | ORAL | Status: DC | PRN

## 2016-03-25 MED ORDER — ENOXAPARIN SODIUM 40 MG/0.4ML ~~LOC~~ SOLN: 40.0000 mg | SUBCUTANEOUS | Status: DC

## 2016-03-25 MED ORDER — MIDAZOLAM HCL 2 MG/2ML IJ SOLN
INTRAMUSCULAR | Status: AC
  Filled 2016-03-25: qty 2

## 2016-03-25 MED ORDER — PROPOFOL 10 MG/ML IV BOLUS
INTRAVENOUS | Status: DC | PRN
  Administered 2016-03-25: 100 mg via INTRAVENOUS

## 2016-03-25 MED ORDER — SUGAMMADEX SODIUM 200 MG/2ML IV SOLN
INTRAVENOUS | Status: DC | PRN
  Administered 2016-03-25: 150 mg via INTRAVENOUS

## 2016-03-25 MED ORDER — OLMESARTAN MEDOXOMIL-HCTZ 40-25 MG PO TABS: 1.0000 | ORAL_TABLET | Freq: Every day | ORAL | Status: DC

## 2016-03-25 MED ORDER — VANCOMYCIN HCL IN DEXTROSE 1-5 GM/200ML-% IV SOLN: 1000.0000 mg | Freq: Two times a day (BID) | INTRAVENOUS | Status: DC

## 2016-03-25 MED ORDER — LIDOCAINE 2% (20 MG/ML) 5 ML SYRINGE
INTRAMUSCULAR | Status: DC | PRN
  Administered 2016-03-25: 60 mg via INTRAVENOUS

## 2016-03-25 MED ORDER — FENTANYL CITRATE (PF) 100 MCG/2ML IJ SOLN
INTRAMUSCULAR | Status: DC | PRN
  Administered 2016-03-25 (×2): 50 ug via INTRAVENOUS

## 2016-03-25 MED ORDER — CLARITHROMYCIN 500 MG PO TABS: 500.0000 mg | ORAL_TABLET | Freq: Two times a day (BID) | ORAL | Status: DC

## 2016-03-25 MED ORDER — PROPOFOL 10 MG/ML IV BOLUS
INTRAVENOUS | Status: AC
  Filled 2016-03-25: qty 20

## 2016-03-25 MED ORDER — BISACODYL 5 MG PO TBEC: 5.0000 mg | DELAYED_RELEASE_TABLET | Freq: Every day | ORAL | Status: DC | PRN

## 2016-03-25 MED ORDER — ONDANSETRON HCL 4 MG/2ML IJ SOLN: 4.0000 mg | Freq: Four times a day (QID) | INTRAMUSCULAR | Status: DC | PRN

## 2016-03-25 MED ORDER — METOCLOPRAMIDE HCL 5 MG/ML IJ SOLN: 5.0000 mg | Freq: Three times a day (TID) | INTRAMUSCULAR | Status: DC | PRN

## 2016-03-25 MED ORDER — HYDROCHLOROTHIAZIDE 25 MG PO TABS: 25.0000 mg | ORAL_TABLET | Freq: Every day | ORAL | Status: DC

## 2016-03-25 MED ORDER — IBUPROFEN 200 MG PO TABS
400.0000 mg | ORAL_TABLET | Freq: Once | ORAL | Status: AC
  Administered 2016-03-25: 400 mg via ORAL
  Filled 2016-03-25: qty 2

## 2016-03-25 MED ORDER — FENTANYL CITRATE (PF) 100 MCG/2ML IJ SOLN
INTRAMUSCULAR | Status: AC
  Filled 2016-03-25: qty 2

## 2016-03-25 MED ORDER — PANTOPRAZOLE SODIUM 40 MG PO TBEC: 40.0000 mg | DELAYED_RELEASE_TABLET | Freq: Every day | ORAL | Status: DC

## 2016-03-25 SURGICAL SUPPLY — 35 items
BANDAGE ACE 4X5 VEL STRL LF (GAUZE/BANDAGES/DRESSINGS) ×3 IMPLANT
BANDAGE ACE 6X5 VEL STRL LF (GAUZE/BANDAGES/DRESSINGS) IMPLANT
BANDAGE ELASTIC 4 VELCRO ST LF (GAUZE/BANDAGES/DRESSINGS) ×2 IMPLANT
BANDAGE ELASTIC 6 VELCRO ST LF (GAUZE/BANDAGES/DRESSINGS) ×3 IMPLANT
BLADE GREAT WHITE 4.2 (BLADE) IMPLANT
BLADE GREAT WHITE 4.2MM (BLADE)
BLADE SURG SZ11 CARB STEEL (BLADE) IMPLANT
BNDG COHESIVE 6X5 TAN STRL LF (GAUZE/BANDAGES/DRESSINGS) ×3 IMPLANT
COVER SURGICAL LIGHT HANDLE (MISCELLANEOUS) ×3 IMPLANT
DRAPE SHEET LG 3/4 BI-LAMINATE (DRAPES) ×3 IMPLANT
DRSG EMULSION OIL 3X3 NADH (GAUZE/BANDAGES/DRESSINGS) ×3 IMPLANT
DRSG PAD ABDOMINAL 8X10 ST (GAUZE/BANDAGES/DRESSINGS) ×6 IMPLANT
DURAPREP 26ML APPLICATOR (WOUND CARE) ×3 IMPLANT
EVACUATOR 1/8 PVC DRAIN (DRAIN) ×2 IMPLANT
GAUZE SPONGE 4X4 12PLY STRL (GAUZE/BANDAGES/DRESSINGS) ×2 IMPLANT
GAUZE SPONGE 4X4 16PLY XRAY LF (GAUZE/BANDAGES/DRESSINGS) ×3 IMPLANT
GLOVE BIOGEL PI IND STRL 8 (GLOVE) ×1 IMPLANT
GLOVE BIOGEL PI INDICATOR 8 (GLOVE) ×2
GLOVE ECLIPSE 8.0 STRL XLNG CF (GLOVE) ×3 IMPLANT
GOWN STRL REUS W/TWL LRG LVL3 (GOWN DISPOSABLE) ×3 IMPLANT
GOWN STRL REUS W/TWL XL LVL3 (GOWN DISPOSABLE) ×3 IMPLANT
KIT BASIN OR (CUSTOM PROCEDURE TRAY) ×3 IMPLANT
MANIFOLD NEPTUNE II (INSTRUMENTS) ×3 IMPLANT
MARKER PEN SURG W/LABELS BLK (STERILIZATION PRODUCTS) ×3 IMPLANT
PACK ARTHROSCOPY WL (CUSTOM PROCEDURE TRAY) ×3 IMPLANT
PACK ICE MAXI GEL EZY WRAP (MISCELLANEOUS) ×1 IMPLANT
PAD MASON LEG HOLDER (PIN) ×3 IMPLANT
SUT ETHILON 3 0 PS 1 (SUTURE) ×3 IMPLANT
SYR 20CC LL (SYRINGE) ×1 IMPLANT
TOWEL OR 17X26 10 PK STRL BLUE (TOWEL DISPOSABLE) ×1 IMPLANT
TUBING ARTHRO INFLOW-ONLY STRL (TUBING) ×3 IMPLANT
TUBING CONNECTING 10 (TUBING) ×1 IMPLANT
TUBING CONNECTING 10' (TUBING)
WAND HAND CNTRL MULTIVAC 90 (MISCELLANEOUS) IMPLANT
WRAP KNEE MAXI GEL POST OP (GAUZE/BANDAGES/DRESSINGS) ×1 IMPLANT

## 2016-03-25 NOTE — Op Note (Signed)
NAMEEVERITT, NARA NO.:  [id number]  MEDICAL RECORD NO.:  [id number]  LOCATION:  WA09                         FACILITY:  [facility]  PHYSICIAN:  Kipp Brood. Leiam Hopwood, M.D.DATE OF BIRTH:  01-01-1941  DATE OF PROCEDURE:  03/25/2016 DATE OF DISCHARGE:                              OPERATIVE REPORT   SURGEON:  Kipp Brood. Gladstone Lighter, M.D.  ASSISTANT:  Ardeen Jourdain, [place].  PREOPERATIVE DIAGNOSIS:  Septic knee.  POSTOPERATIVE DIAGNOSIS:  Septic knee.  HISTORY:  This gentleman was seen in the office here last week for just back pain only.  No knee pain.  No other complaints whatsoever.  He never had any knee surgery at all.  He had back surgery several years ago.  He awakened with a painful swollen knee.  He was admitted to the emergency room.  Through the emergency room, aspirate of the knee showed a Staphylococcus.  So, he basically had a septic knee for reasons unknown.  His back was MRI done in the ER and it did not show any infection in his back.  So, this is the first occurrence.  Never had surgery on that knee.  OPERATION:  Arthroscopic irrigation debridement of left knee under general anesthesia.  DESCRIPTION OF PROCEDURE:  After the patient was under general anesthetic, a routine orthopedic prep and draping of the left lower extremity was carried out.  The left leg was placed in the knee holders. An appropriate time-out was carried out, also marked the appropriate left leg in the holding area.  At this time, a small punctate incision made in the suprapatellar pouch.  The initial fluid was a little cloudy, but not real bad.  There was not any sick purulent material.  We immediately did cultures for aerobic and anaerobic.  At that time, another small punctate incision was made in the medial joint space.  The arthroscope cannula was inserted and we did then connected the tubing in to do a complete washout of the knee.  I utilized about 6000 mL of saline.  Note,  once we removed the fluid, we initially put the cannula in.  The fluid remained completely clear.  I milked the soft tissue constantly during the procedure to make sure there were no clotted materials in the suprapatellar pouch while we were irrigating.  The fluid completely remained clear.  It was very encouraging at this time. After thoroughly irrigating, I inserted a Hemovac drain through the suprapatellar tube and sutured that in place and hooked it up to the Hemovac drain.  The wounds then were dressed with a sterile dressing and the patient left the operating room in satisfactory condition.  He was initially on Zosyn and vancomycin.  He needs to be watched closely because he had a rise in his creatinine while in the hospital.          ______________________________ Kipp Brood. Gladstone Lighter, M.D.     RAG/MEDQ  D:  03/25/2016  T:  03/25/2016  Job:  [id number]

## 2016-03-25 NOTE — Anesthesia Procedure Notes (Signed)
Procedure Name: Intubation Date/Time: 03/25/2016 4:17 PM Performed by: Cynda Familia Pre-anesthesia Checklist: Patient identified, Emergency Drugs available, Suction available and Patient being monitored Patient Re-evaluated:Patient Re-evaluated prior to inductionOxygen Delivery Method: Circle System Utilized Preoxygenation: Pre-oxygenation with 100% oxygen Intubation Type: IV induction Ventilation: Mask ventilation without difficulty Laryngoscope Size: Miller and 2 Grade View: Grade I Tube type: Oral Number of attempts: 1 Airway Equipment and Method: Stylet Placement Confirmation: ETT inserted through vocal cords under direct vision,  positive ETCO2 and breath sounds checked- equal and bilateral Secured at: 21 cm Tube secured with: Tape Dental Injury: Teeth and Oropharynx as per pre-operative assessment  Comments: Smooth IV induction--- Ola Spurr--- intubation AM CRNA atraumatic--- teeth front with irregular surfaces prior to intubation--- bilat BS Ola Spurr

## 2016-03-25 NOTE — Care Management Note (Signed)
Case Management Note  Patient Details  Name: Zachary Mcdonald MRN: [id number] Date of Birth: Apr 02, 1940  Subjective/Objective: Pt admitted with Septic joint of left knee joint.                    Action/Plan: will continue to follow pt for [facility] needs   Expected Discharge Date:   (unknown)               Expected Discharge Plan:  [place]  In-House Referral:  NA  Discharge planning Services  CM Consult  Post Acute Care Choice:  Home Health Choice offered to:  Patient  DME Arranged:  N/A DME Agency:  NA  HH Arranged:    [place] Agency:     Status of Service:  In process, will continue to follow  If discussed at Long Length of Stay Meetings, dates discussed:    Additional CommentsPurcell Mouton, RN 03/25/2016, 3:22 PM

## 2016-03-25 NOTE — Anesthesia Postprocedure Evaluation (Signed)
Anesthesia Post Note  Patient: Zachary Mcdonald  Procedure(s) Performed: Procedure(s) (LRB): ARTHROSCOPIC WASHOUT OF LEFT KNEE (Left)  Patient location during evaluation: PACU Anesthesia Type: General Level of consciousness: awake and alert Pain management: pain level controlled Vital Signs Assessment: post-procedure vital signs reviewed and stable Respiratory status: spontaneous breathing, nonlabored ventilation, respiratory function stable and patient connected to nasal cannula oxygen Cardiovascular status: blood pressure returned to baseline and stable Postop Assessment: no signs of nausea or vomiting Anesthetic complications: no       Last Vitals:  Vitals:   03/25/16 1745 03/25/16 1800  BP: 133/64 (!) 154/73  Pulse: 98 98  Resp: (!) 29 (!) 42  Temp:  37.8 C    Last Pain:  Vitals:   03/25/16 1800  TempSrc:   PainSc: 5                  Valdez Brannan,W. EDMOND

## 2016-03-25 NOTE — Progress Notes (Signed)
PROGRESS NOTE    Zachary Mcdonald  [id number] DOB: Apr 23, 1940 DOA: 03/24/2016 PCP: Tivis Ringer, MD   Brief Narrative:  Zachary Mcdonald  is a 76 y.o. male, With past medical history of arthritis, gout, GERD, hypertension, chronic lower back pain, he presents to ED secondary to left knee pain, chills, patient reports is been having worsening lower back pain over last few weeks, recently seen by Dr. Dellis Filbert, where he was given steroid taper, but he denies any procedure, our injections, reports yesterday he started to have some left knee pain, reports he was trying to stand up from sitting position today, where he fell his knee give out and fell, and was unable to get up from the floor, the shunt reports feeling febrile, having some chills, he denies any chest pain, shortness of breath, nausea vomiting diarrhea, or any respiratory symptoms. ED workup significant for fever, tachycardia, leukocytosis, left synovial fluid arthrocentesis significant for leukocytosis of 102 K, with no crystals, received IV vancomycin, and I was called to admit.  Assessment & Plan:   Active Problems:   Septic joint of left knee joint (HCC)   Hypertension   Hypothyroidism   Septic arthritis of knee, left (HCC)   Septic infrapatellar bursitis of left knee  Sepsis secondary to left knee septic joint-  - Patient presents with fever, tachycardia and leukocytosis with elevated lactic acid - Cultures 1 growing strep , sensitivities pending  - F/u blood culture and body fluid cultures. - Will d/c vanc,  - continue Zosyn  - Septic left knee, status post arthrocentesis in ED with 102000 blood cells, follow on Gram stain and cultures, continue with IV vancomycin and Zosyn - Had arthroscopic washout today  - Patient with worsening lower back pain, will obtain MRI  lumbar spine- Negative for lumbosacral discitis or osteomyelitis, and normal SI joints. 2. Acute myositis of the bilateral medial gluteal muscles which could  be infectious or inflammatory.   Acute kidney injury-  creatinine on admission 1.2, trended up to 2. 59 this a.m. ?hypotension, requiring total of 5L of IVF. No exposure to contrast. Likely due to one-time dose of 100 mg of ibuprofen given for fever overnight. ?vanc. - Continue IV hydration - UA on admission unremarkable,  - Consider renal US if no improvement. - Trend creatinine  Hypertension - Patient with soft blood pressure despite fluid boluses, will admit to stepdown, continue with IV fluids, continue to hold home medications- Benicar HCTZ.  Hypothyroidism - Continue Synthroid  Hypokalemia - Repleted, recheck in a.m.Marland Kitchen  DVT Prophylaxis Heparin -   SCDs  AM Labs Ordered, also please review Full Orders  Family Communication: Admission, patients condition and plan of care including tests being ordered have been discussed with the patient and wife who indicate understanding and agree with the plan and Code Status.  DVT prophylaxis: (Lovenox/Heparin/SCD's/anticoagulated/None (if comfort care) Code Status: (Full/Partial - specify details) Family Communication: (Specify name, relationship & date discussed. NO "discussed with patient") Disposition Plan: (specify when and where you expect patient to be discharged). Include barriers to DC in this tab.   Consultants:   Ortho  Procedures:  Athroscopic washout.  Antimicrobials: (specify start and planned stop date. Auto populated tables are space occupying and do not give end dates)  Vanc 2/5 >>2/6  Zosyn 2/5>>  Subjective: No complaints today. Leg pain improved. Evaluated before procedure.   Objective: Vitals:   03/25/16 1745 03/25/16 1800 03/25/16 1815 03/25/16 1840  BP: 133/64 (!) 154/73 (!) 112/56 112/60  Pulse:  98 98 91 88  Resp: (!) 29 (!) 42 (!) 33 (!) 29  Temp:  100 F (37.8 C)  (!) 100.8 F (38.2 C)  TempSrc:      SpO2: 100% 98% 97% 97%  Weight:      Height:        Intake/Output Summary (Last 24  hours) at 03/25/16 1959 Last data filed at 03/25/16 1815  Gross per 24 hour  Intake          2941.67 ml  Output              410 ml  Net          2531.67 ml   Filed Weights   03/24/16 1040  Weight: 95.3 kg (210 lb)    Examination:  General exam: Appears calm and comfortable  Respiratory system: Clear to auscultation. Respiratory effort normal. Cardiovascular system: S1 & S2 heard, RRR. No JVD, murmurs, rubs, gallops or clicks. No pedal edema. Gastrointestinal system: Abdomen is nondistended, soft and nontender. No organomegaly or masses felt. Normal bowel sounds heard. Central nervous system: Alert and oriented. No focal neurological deficits. Moving all extremities, Extremities: Chronic right foot drop. Swelling, tenderness Left knee. Warm.  Skin: No rashes, lesions or ulcers Psychiatry: Judgement and insight appear normal. Mood & affect appropriate.   Data Reviewed: I have personally reviewed following labs and imaging studies  CBC:  Recent Labs Lab 03/24/16 0638 03/24/16 1305 03/25/16 0528 03/25/16 1905  WBC 18.5* 20.5* 15.8* 12.1*  NEUTROABS 15.2*  --  12.9*  --   HGB 14.1 13.2 12.5* 12.0*  HCT 39.8 36.5* 35.0* 34.7*  MCV 87.1 90.1 91.1 90.4  PLT 186 166 139* [id number]*   Basic Metabolic Panel:  Recent Labs Lab 03/24/16 0731 03/24/16 1305 03/25/16 0528  NA 135  --  137  K 3.1*  --  4.4  CL 100*  --  109  CO2 28  --  20*  GLUCOSE 163*  --  154*  BUN 31*  --  32*  CREATININE 1.25* 1.11 2.59*  CALCIUM 8.8*  --  8.2*   Liver Function Tests:  Recent Labs Lab 03/24/16 0731 03/25/16 0528  AST 16 52*  ALT 22 27  ALKPHOS 44 37*  BILITOT 1.0 1.6*  PROT 6.2* 5.3*  ALBUMIN 3.8 3.0*   Coagulation Profile:  Recent Labs Lab 03/25/16 0528  INR 1.36   CBG:  Recent Labs Lab 03/24/16 2107  GLUCAP 131*   Sepsis Labs:  Recent Labs Lab 03/24/16 0710 03/24/16 1256 03/24/16 1305  PROCALCITON  --   --  3.17  LATICACIDVEN 2.10* 1.0  --     Recent  Results (from the past 240 hour(s))  Blood culture (routine x 2)     Status: Abnormal (Preliminary result)   Collection Time: 03/24/16  6:38 AM  Result Value Ref Range Status   Specimen Description BLOOD RIGHT HAND  Final   Special Requests BOTTLES DRAWN AEROBIC AND ANAEROBIC 5ML  Final   Culture  Setup Time   Final    GRAM POSITIVE COCCI IN CHAINS IN PAIRS ANAEROBIC BOTTLE ONLY CRITICAL RESULT CALLED TO, READ BACK BY AND VERIFIED WITH: BETH GREEN, PHARMD @0050  03/25/16 MKELLY,MLT    Culture (A)  Final    STREPTOCOCCUS GROUP G SUSCEPTIBILITIES TO FOLLOW Performed at [facility] [address], [place], [place] [address]    Report Status PENDING  Incomplete  Blood Culture ID Panel (Reflexed)     Status: Abnormal  Collection Time: 03/24/16  6:38 AM  Result Value Ref Range Status   Enterococcus species NOT DETECTED NOT DETECTED Final   Listeria monocytogenes NOT DETECTED NOT DETECTED Final   Staphylococcus species NOT DETECTED NOT DETECTED Final   Staphylococcus aureus NOT DETECTED NOT DETECTED Final   Streptococcus species DETECTED (A) NOT DETECTED Final    Comment: Not Enterococcus species, Streptococcus agalactiae, Streptococcus pyogenes, or Streptococcus pneumoniae. CRITICAL RESULT CALLED TO, READ BACK BY AND VERIFIED WITH: BETH GREEN, PHARMD @0050  03/25/16 MKELLY,MLT    Streptococcus agalactiae NOT DETECTED NOT DETECTED Final   Streptococcus pneumoniae NOT DETECTED NOT DETECTED Final   Streptococcus pyogenes NOT DETECTED NOT DETECTED Final   Acinetobacter baumannii NOT DETECTED NOT DETECTED Final   Enterobacteriaceae species NOT DETECTED NOT DETECTED Final   Enterobacter cloacae complex NOT DETECTED NOT DETECTED Final   Escherichia coli NOT DETECTED NOT DETECTED Final   Klebsiella oxytoca NOT DETECTED NOT DETECTED Final   Klebsiella pneumoniae NOT DETECTED NOT DETECTED Final   Proteus species NOT DETECTED NOT DETECTED Final   Serratia marcescens NOT DETECTED NOT  DETECTED Final   Haemophilus influenzae NOT DETECTED NOT DETECTED Final   Neisseria meningitidis NOT DETECTED NOT DETECTED Final   Pseudomonas aeruginosa NOT DETECTED NOT DETECTED Final   Candida albicans NOT DETECTED NOT DETECTED Final   Candida glabrata NOT DETECTED NOT DETECTED Final   Candida krusei NOT DETECTED NOT DETECTED Final   Candida parapsilosis NOT DETECTED NOT DETECTED Final   Candida tropicalis NOT DETECTED NOT DETECTED Final    Comment: Performed at [facility], [place] [address], [place], [place] [address]  Urine culture     Status: Abnormal   Collection Time: 03/24/16  6:48 AM  Result Value Ref Range Status   Specimen Description URINE, CLEAN CATCH  Final   Special Requests NONE  Final   Culture MULTIPLE SPECIES PRESENT, SUGGEST RECOLLECTION (A)  Final   Report Status 03/25/2016 FINAL  Final  Blood culture (routine x 2)     Status: None (Preliminary result)   Collection Time: 03/24/16  6:59 AM  Result Value Ref Range Status   Specimen Description BLOOD LEFT FOREARM  Final   Special Requests BOTTLES DRAWN AEROBIC AND ANAEROBIC 5ML  Final   Culture   Final    NO GROWTH 1 DAY Performed at [facility], [place] [address] [place], [place] [address]    Report Status PENDING  Incomplete  Body fluid culture     Status: None (Preliminary result)   Collection Time: 03/24/16  7:30 AM  Result Value Ref Range Status   Specimen Description KNEE LEFT  Final   Special Requests NONE  Final   Gram Stain   Final    WBC PRESENT,BOTH PMN AND MONONUCLEAR NO ORGANISMS SEEN CYTOSPIN SMEAR Gram Stain Report Called to,Read Back By and Verified With: ROSSER,M. RN @0859  ON 2.5.18 BY NMCCOY    Culture   Final    NO GROWTH 1 DAY Performed at [facility], [place] [address], [place], [place] [address]    Report Status PENDING  Incomplete  Surgical pcr screen     Status: Abnormal   Collection Time: 03/24/16  2:32 PM  Result Value Ref Range Status   MRSA, PCR POSITIVE (A)  NEGATIVE Final    Comment: RESULT CALLED TO, READ BACK BY AND VERIFIED WITH: L.HUDSON RN AT 1950 ON 03/24/16 BY S.VANHOORNE    Staphylococcus aureus POSITIVE (A) NEGATIVE Final    Comment:  The Xpert SA Assay (FDA approved for NASAL specimens in patients over 80 years of age), is one component of a comprehensive surveillance program.  Test performance has been validated by [facility] for patients greater than or equal to 14 year old. It is not intended to diagnose infection nor to guide or monitor treatment.      Radiology Studies: Mr Lumbar Spine W Wo Contrast  Result Date: 03/24/2016 CLINICAL DATA:  76 year old male with septic knee joint and suspected bacteremia. Fever and chills. Severe low back pain, maximal in the sacral/buttock area. Prior lumbar spine surgery. Initial encounter. EXAM: MRI LUMBAR SPINE WITHOUT AND WITH CONTRAST TECHNIQUE: Multiplanar and multiecho pulse sequences of the lumbar spine were obtained without and with intravenous contrast. CONTRAST:  22mL MULTIHANCE GADOBENATE DIMEGLUMINE 529 MG/ML IV SOLN COMPARISON:  CT lumbar myelogram 06/13/2013. FINDINGS: Segmentation: Normal as demonstrated on the comparison CT myelogram. There is a vestigial S1-S2 disc space, but the S1 level is fully sacralized. Alignment: Mild dextroconvex lumbar scoliosis. Overall stable vertebral height and alignment since 2015. Vertebrae: Chronic degenerative endplate changes at 624THL and L5-S1. Trace if any associated marrow edema appears degenerative in nature. No abnormal enhancement identified. No evidence of lumbar osteomyelitis. Lower field of view was utilized in order to visualize the sacrum. Additional coronal STIR imaging of the sacrum was also obtained (series 7). Sacral bone marrow signal is normal and both SI joints are normal. Partially visible iliac wings and bilateral hip joints also appear normal. Conus medullaris: Extends to the L1-L2 level and appears normal. No abnormal  intradural enhancement. Paraspinal and other soft tissues: Mild distention of the urinary bladder. Mild ectasia of the seminal vesicles. Nonspecific perinephric stranding which can be age or renal insufficiency related. The lumbar posterior paraspinal muscles and soft tissues are normal. Posterior to the lower sacrum there is bilateral but slightly left worse than right increased STIR signal and enhancement in the medial gluteal musculature (series 7, image 12). No associated intramuscular abscess. The overlying subcutaneous soft tissues are normal except for lumbosacral junction level subcutaneous edema which is nonspecific but probably unrelated. Disc levels: Lumbar spine degeneration notable for the following. L2-L3: Severe chronic disc space loss with vacuum disc. Circumferential disc osteophyte complex with posterior element hypertrophy. Chronic but increased moderate to severe spinal and left lateral recess stenosis (series 5, image 12). Mild to moderate left L2 foraminal stenosis. L3-L4: Mild multifactorial spinal stenosis related to circumferential disc bulge and moderate posterior element hypertrophy. L5-S1: Chronic postoperative changes to the right lamina. Chronic severe disc space loss and vacuum disc. Circumferential disc osteophyte complex broad-based posterior component. Severe residual facet hypertrophy with trace facet joint fluid. No spinal stenosis following decompression. Architectural distortion at the right lateral recess. Up to mild left lateral recess stenosis. Moderate to severe bilateral L5 foraminal stenosis appears primarily related to disc and endplate spurring and unchanged since 2015. IMPRESSION: 1. Negative for lumbosacral discitis or osteomyelitis, and normal SI joints. 2. Acute myositis of the bilateral medial gluteal muscles which could be infectious or inflammatory. No associated intramuscular abscess. 3. Chronic lumbar spine degeneration and postoperative changes with mild  progression since 2015. Moderate to severe spinal and left lateral recess stenosis at L2-L3. Electronically Signed   By: Genevie Ann M.D.   On: 03/24/2016 12:29   Dg Chest Port 1 View  Result Date: 03/24/2016 CLINICAL DATA:  Fever and chills, tachycardia, septic left knee joint. EXAM: PORTABLE CHEST 1 VIEW COMPARISON:  Portable chest x-ray of June 14, 2015 FINDINGS: The lungs are adequately inflated. There is no focal infiltrate. There is no pleural effusion. The heart is top-normal in size. The pulmonary vascularity is normal. There is calcification in the wall of the aortic arch. The trachea is midline. There is no pleural effusion. The bony thorax exhibits no acute abnormality. IMPRESSION: No acute cardiopulmonary abnormality. Thoracic aortic atherosclerosis. Electronically Signed   By: David  Martinique M.D.   On: 03/24/2016 10:48   Dg Knee Complete 4 Views Left  Result Date: 03/24/2016 CLINICAL DATA:  Patient fell. Popliteal pain. Unable to bear weight. LEFT leg gave out. EXAM: LEFT KNEE - COMPLETE 4+ VIEW COMPARISON:  None. FINDINGS: No fracture is seen. There is a moderate suprapatellar bursa effusion. Patellar spurs are noted. IMPRESSION: Moderate effusion.  No fracture. Electronically Signed   By: Staci Righter M.D.   On: 03/24/2016 06:37   Scheduled Meds: . allopurinol  300 mg Oral Daily  . Chlorhexidine Gluconate Cloth  6 each Topical Q0600  . [START ON 03/26/2016] diltiazem  420 mg Oral Daily  . [START ON 03/26/2016] enoxaparin (LOVENOX) injection  40 mg Subcutaneous Q24H  . ferrous sulfate  325 mg Oral TID PC  . gabapentin  300 mg Oral TID  . [START ON 03/26/2016] irbesartan  300 mg Oral Daily   And  . [START ON 03/26/2016] hydrochlorothiazide  25 mg Oral Daily  . levothyroxine  50 mcg Oral QAC breakfast  . mupirocin ointment  1 application Nasal BID  . [START ON 03/26/2016] pantoprazole  40 mg Oral Daily  . piperacillin-tazobactam (ZOSYN)  IV  3.375 g Intravenous Q8H  . senna  1 tablet Oral BID    . simvastatin  40 mg Oral QPM  . tamsulosin  0.4 mg Oral Daily   Continuous Infusions: . sodium chloride 100 mL/hr at 03/25/16 0303  . lactated ringers       LOS: 1 day   Bethena Roys, MD [facility] Pager [phone]  If 7PM-7AM, please contact night-coverage [email or web address] Password [email or web address] 03/25/2016, 7:59 PM

## 2016-03-25 NOTE — Anesthesia Preprocedure Evaluation (Addendum)
Anesthesia Evaluation  Patient identified by MRN, date of birth, ID band Patient awake    Reviewed: Allergy & Precautions, H&P , NPO status , Patient's Chart, lab work & pertinent test results  Airway Mallampati: II  TM Distance: >3 FB Neck ROM: Full    Dental no notable dental hx. (+) Teeth Intact, Dental Advisory Given   Pulmonary neg pulmonary ROS,    Pulmonary exam normal breath sounds clear to auscultation       Cardiovascular hypertension, Pt. on medications  Rhythm:Regular Rate:Normal     Neuro/Psych negative neurological ROS  negative psych ROS   GI/Hepatic Neg liver ROS, GERD  Medicated and Controlled,  Endo/Other  Hypothyroidism   Renal/GU Renal InsufficiencyRenal disease  negative genitourinary   Musculoskeletal  (+) Arthritis , Osteoarthritis,    Abdominal   Peds  Hematology negative hematology ROS (+)   Anesthesia Other Findings   Reproductive/Obstetrics negative OB ROS                            Anesthesia Physical Anesthesia Plan  ASA: II  Anesthesia Plan: General   Post-op Pain Management:    Induction: Intravenous  Airway Management Planned: Oral ETT  Additional Equipment:   Intra-op Plan:   Post-operative Plan: Extubation in OR  Informed Consent: I have reviewed the patients History and Physical, chart, labs and discussed the procedure including the risks, benefits and alternatives for the proposed anesthesia with the patient or authorized representative who has indicated his/her understanding and acceptance.   Dental advisory given  Plan Discussed with: CRNA and Surgeon  Anesthesia Plan Comments:        Anesthesia Quick Evaluation

## 2016-03-25 NOTE — Interval H&P Note (Signed)
History and Physical Interval Note:  03/25/2016 3:49 PM  Zachary Mcdonald  has presented today for surgery, with the diagnosis of LEFT KNEE INFECTION  The various methods of treatment have been discussed with the patient and family. After consideration of risks, benefits and other options for treatment, the patient has consented to  Procedure(s): ARTHROSCOPIC WASHOUT OF LEFT KNEE (Left) as a surgical intervention .  The patient's history has been reviewed, patient examined, no change in status, stable for surgery.  I have reviewed the patient's chart and labs.  Questions were answered to the patient's satisfaction.     Kawena Lyday A

## 2016-03-25 NOTE — Progress Notes (Signed)
Last night, patient had a temp of 103.4, HR in the 90's. Tylenol was given and on call notified. New orders given for a 500cc bolus. When temp was rechecked, it had come down to 101.1. Checked temp again at 0230 and it was 102.9. On call notified and new orders were given for a one time dose of ibuprofen and a 500cc bolus. Rechecked temp this AM and it was 98.6

## 2016-03-25 NOTE — Brief Op Note (Signed)
03/24/2016 - 03/25/2016  5:06 PM  PATIENT:  Zachary Mcdonald  76 y.o. male  PRE-OPERATIVE DIAGNOSIS:  LEFT KNEE INFECTION.Septic Knee.  POST-OPERATIVE DIAGNOSIS:  LEFT KNEE INFECTION.Septic  PROCEDURE:  Procedure(s): ARTHROSCOPIC WASHOUT OF LEFT KNEE (Left)  SURGEON:  Surgeon(s) and Role:    * Latanya Maudlin, MD - Primary  PHYSICIAN ASSISTANT:Amber Devine PA   ASSISTANTS:Amber Escobares PA   ANESTHESIA:   general  EBL:  Total I/O In: 891.7 [I.V.:841.7; IV Piggyback:50] Out: 310 [Urine:300; Blood:10]  BLOOD ADMINISTERED:none  DRAINS: (One) Hemovact drain(s) in the Left Knee. with  Suction Open   LOCAL MEDICATIONS USED:  NONE  SPECIMEN:  Source of Specimen:  Fluid from Left Knee  DISPOSITION OF SPECIMEN:  PATHOLOGY  COUNTS:  YES  TOURNIQUET:  * No tourniquets in log *  DICTATION: .Other Dictation: Dictation Number [id number]  PLAN OF CARE: Admit to inpatient   PATIENT DISPOSITION:  PACU - hemodynamically stable.   Delay start of Pharmacological VTE agent (>24hrs) due to surgical blood loss or risk of bleeding: yes

## 2016-03-25 NOTE — Progress Notes (Signed)
PHARMACY - PHYSICIAN COMMUNICATION CRITICAL VALUE ALERT - BLOOD CULTURE IDENTIFICATION (BCID)  Results for orders placed or performed during the hospital encounter of 03/24/16  Blood Culture ID Panel (Reflexed) (Collected: 03/24/2016  6:38 AM)  Result Value Ref Range   Enterococcus species NOT DETECTED NOT DETECTED   Listeria monocytogenes NOT DETECTED NOT DETECTED   Staphylococcus species NOT DETECTED NOT DETECTED   Staphylococcus aureus NOT DETECTED NOT DETECTED   Streptococcus species DETECTED (A) NOT DETECTED   Streptococcus agalactiae NOT DETECTED NOT DETECTED   Streptococcus pneumoniae NOT DETECTED NOT DETECTED   Streptococcus pyogenes NOT DETECTED NOT DETECTED   Acinetobacter baumannii NOT DETECTED NOT DETECTED   Enterobacteriaceae species NOT DETECTED NOT DETECTED   Enterobacter cloacae complex NOT DETECTED NOT DETECTED   Escherichia coli NOT DETECTED NOT DETECTED   Klebsiella oxytoca NOT DETECTED NOT DETECTED   Klebsiella pneumoniae NOT DETECTED NOT DETECTED   Proteus species NOT DETECTED NOT DETECTED   Serratia marcescens NOT DETECTED NOT DETECTED   Haemophilus influenzae NOT DETECTED NOT DETECTED   Neisseria meningitidis NOT DETECTED NOT DETECTED   Pseudomonas aeruginosa NOT DETECTED NOT DETECTED   Candida albicans NOT DETECTED NOT DETECTED   Candida glabrata NOT DETECTED NOT DETECTED   Candida krusei NOT DETECTED NOT DETECTED   Candida parapsilosis NOT DETECTED NOT DETECTED   Candida tropicalis NOT DETECTED NOT DETECTED    Name of physician (or Provider) Contacted: K Schorr  Changes to prescribed antibiotics required: could be contaminant- in x1 anaerobic bottle. Continue zosyn for now.  Dorrene German 03/25/2016  1:11 AM

## 2016-03-25 NOTE — Transfer of Care (Signed)
Immediate Anesthesia Transfer of Care Note  Patient: Zachary Mcdonald  Procedure(s) Performed: Procedure(s): ARTHROSCOPIC WASHOUT OF LEFT KNEE (Left)  Patient Location: PACU  Anesthesia Type:General  Level of Consciousness: awake and alert   Airway & Oxygen Therapy: Patient Spontanous Breathing and Patient connected to face mask oxygen  Post-op Assessment: Report given to RN and Post -op Vital signs reviewed and stable  Post vital signs: Reviewed and stable  Last Vitals:  Vitals:   03/25/16 1521 03/25/16 1528  BP:  (!) 165/65  Pulse:  88  Resp:  20  Temp: 37.9 C     Last Pain:  Vitals:   03/25/16 1521  TempSrc: Oral  PainSc:       Patients Stated Pain Goal: 2 ([id number])  Complications: No apparent anesthesia complications

## 2016-03-25 NOTE — Progress Notes (Signed)
Pharmacy Antibiotic Note  Zachary Mcdonald is a 76 y.o. male admitted on 03/24/2016 with Sepsis secondary to left knee septic joint.  Pharmacy has been consulted for vancomycin/Zosyn dosing.  Plan:  Continue Zosyn 3.375g IV Q8H infused over 4hrs. Continue to hold Vancomycin  Recheck Vancomycin random level and re-enter doses as needed. Follow up renal fxn, culture results, and clinical course.   Height: 6' (182.9 cm) Weight: 210 lb (95.3 kg) IBW/kg (Calculated) : 77.6  Temp (24hrs), Avg:101 F (38.3 C), Min:98.6 F (37 C), Max:103.2 F (39.6 C)   Recent Labs Lab 03/24/16 0638 03/24/16 0710 03/24/16 0731 03/24/16 1256 03/24/16 1305 03/25/16 0528  WBC 18.5*  --   --   --  20.5* 15.8*  CREATININE  --   --  1.25*  --  1.11 2.59*  LATICACIDVEN  --  2.10*  --  1.0  --   --     Estimated Creatinine Clearance: 29.5 mL/min (by C-G formula based on SCr of 2.59 mg/dL (H)).    Allergies  Allergen Reactions  . Oxycontin [Oxycodone Hcl] Nausea And Vomiting and Other (See Comments)    Dizziness   . Valsartan-Hydrochlorothiazide Diarrhea    Antimicrobials this admission: 2/5 vancomycin >>  2/5 Zosyn >>   Dose adjustments this admission: 2/6 1500 VT = 35  Microbiology results: 2/5BCx: 1/2 Strep Group G      2/6 BCID>strep species x1 anaerobic bottle, text paged K Schorr, no abx change at this time 2/5UCx: multiple species 2/5 Knee fluid: no organisms on gram stain, Cxt pending   Thank you for allowing pharmacy to be a part of this patient's care.  Gretta Arab PharmD, BCPS Pager [phone] 03/25/2016 8:28 AM

## 2016-03-25 NOTE — H&P (View-Only) (Signed)
Subjective:   Procedure(s) (LRB): ARTHROSCOPIC WASHOUT OF LEFT KNEE (Left) Patient reports pain as 4 on 0-10 scale. Left Knee pain and swellingWBC 20.5 His wife said that he had a cough last week.  Objective: Vital signs in last 24 hours: Temp:  [99 F (37.2 C)-100.5 F (38.1 C)] 99 F (37.2 C) (02/05 1334) Pulse Rate:  [73-112] 73 (02/05 1334) Resp:  [14-28] 18 (02/05 1334) BP: (88-134)/(48-76) 110/61 (02/05 1334) SpO2:  [91 %-97 %] 97 % (02/05 1334) Weight:  [95.3 kg (210 lb)] 95.3 kg (210 lb) (02/05 1040)  Intake/Output from previous day: No intake/output data recorded. Intake/Output this shift: Total I/O In: 4250 [I.V.:1000; IV Piggyback:3250] Out: 720 [Urine:720]   Recent Labs  03/24/16 0638 03/24/16 1305  HGB 14.1 13.2    Recent Labs  03/24/16 0638 03/24/16 1305  WBC 18.5* 20.5*  RBC 4.57 4.05*  HCT 39.8 36.5*  PLT 186 166    Recent Labs  03/24/16 0731 03/24/16 1305  NA 135  --   K 3.1*  --   CL 100*  --   CO2 28  --   BUN 31*  --   CREATININE 1.25* 1.11  GLUCOSE 163*  --   CALCIUM 8.8*  --    No results for input(s): LABPT, INR in the last 72 hours.  Painful,Swollen Left Knee,of sudden onset.Aspiration of left knee rwevealed cloudy fluid which was sent to the Lab for cultures.  Assessment/Plan:   Procedure(s) (LRB): ARTHROSCOPIC WASHOUT OF LEFT KNEE (Left)   Zachary Mcdonald A 03/24/2016, 1:38 PM

## 2016-03-26 ENCOUNTER — Encounter (HOSPITAL_COMMUNITY): Payer: Self-pay | Admitting: Orthopedic Surgery

## 2016-03-26 DIAGNOSIS — Z833 Family history of diabetes mellitus: Secondary | ICD-10-CM

## 2016-03-26 DIAGNOSIS — Z888 Allergy status to other drugs, medicaments and biological substances status: Secondary | ICD-10-CM

## 2016-03-26 DIAGNOSIS — M00262 Other streptococcal arthritis, left knee: Secondary | ICD-10-CM

## 2016-03-26 DIAGNOSIS — K219 Gastro-esophageal reflux disease without esophagitis: Secondary | ICD-10-CM | POA: Diagnosis present

## 2016-03-26 DIAGNOSIS — G8929 Other chronic pain: Secondary | ICD-10-CM

## 2016-03-26 DIAGNOSIS — B955 Unspecified streptococcus as the cause of diseases classified elsewhere: Secondary | ICD-10-CM | POA: Diagnosis present

## 2016-03-26 DIAGNOSIS — N289 Disorder of kidney and ureter, unspecified: Secondary | ICD-10-CM

## 2016-03-26 DIAGNOSIS — M25551 Pain in right hip: Secondary | ICD-10-CM

## 2016-03-26 DIAGNOSIS — D649 Anemia, unspecified: Secondary | ICD-10-CM | POA: Diagnosis present

## 2016-03-26 DIAGNOSIS — M539 Dorsopathy, unspecified: Secondary | ICD-10-CM | POA: Diagnosis present

## 2016-03-26 DIAGNOSIS — D696 Thrombocytopenia, unspecified: Secondary | ICD-10-CM

## 2016-03-26 DIAGNOSIS — Z885 Allergy status to narcotic agent status: Secondary | ICD-10-CM

## 2016-03-26 DIAGNOSIS — E785 Hyperlipidemia, unspecified: Secondary | ICD-10-CM | POA: Diagnosis present

## 2016-03-26 DIAGNOSIS — M545 Low back pain: Secondary | ICD-10-CM

## 2016-03-26 DIAGNOSIS — N179 Acute kidney failure, unspecified: Secondary | ICD-10-CM

## 2016-03-26 DIAGNOSIS — Z800 Family history of malignant neoplasm of digestive organs: Secondary | ICD-10-CM

## 2016-03-26 DIAGNOSIS — R7881 Bacteremia: Secondary | ICD-10-CM

## 2016-03-26 DIAGNOSIS — B954 Other streptococcus as the cause of diseases classified elsewhere: Secondary | ICD-10-CM

## 2016-03-26 LAB — CBC WITH DIFFERENTIAL/PLATELET
BASOS ABS: 0 10*3/uL (ref 0.0–0.1)
Basophils Relative: 0 %
EOS ABS: 0 10*3/uL (ref 0.0–0.7)
EOS PCT: 0 %
HCT: 33.9 % — ABNORMAL LOW (ref 39.0–52.0)
Hemoglobin: 11.7 g/dL — ABNORMAL LOW (ref 13.0–17.0)
Lymphocytes Relative: 16 %
Lymphs Abs: 1.5 10*3/uL (ref 0.7–4.0)
MCH: 31.9 pg (ref 26.0–34.0)
MCHC: 34.5 g/dL (ref 30.0–36.0)
MCV: 92.4 fL (ref 78.0–100.0)
Monocytes Absolute: 1.1 10*3/uL — ABNORMAL HIGH (ref 0.1–1.0)
Monocytes Relative: 12 %
Neutro Abs: 6.7 10*3/uL (ref 1.7–7.7)
Neutrophils Relative %: 72 %
PLATELETS: 122 10*3/uL — AB (ref 150–400)
RBC: 3.67 MIL/uL — ABNORMAL LOW (ref 4.22–5.81)
RDW: 14.5 % (ref 11.5–15.5)
WBC: 9.3 10*3/uL (ref 4.0–10.5)

## 2016-03-26 LAB — CBC
HCT: 34.1 % — ABNORMAL LOW (ref 39.0–52.0)
Hemoglobin: 11.6 g/dL — ABNORMAL LOW (ref 13.0–17.0)
MCH: 31.1 pg (ref 26.0–34.0)
MCHC: 34 g/dL (ref 30.0–36.0)
MCV: 91.4 fL (ref 78.0–100.0)
PLATELETS: 128 10*3/uL — AB (ref 150–400)
RBC: 3.73 MIL/uL — ABNORMAL LOW (ref 4.22–5.81)
RDW: 14.4 % (ref 11.5–15.5)
WBC: 11.2 10*3/uL — ABNORMAL HIGH (ref 4.0–10.5)

## 2016-03-26 LAB — CULTURE, BLOOD (ROUTINE X 2)

## 2016-03-26 LAB — BASIC METABOLIC PANEL
Anion gap: 7 (ref 5–15)
BUN: 48 mg/dL — ABNORMAL HIGH (ref 6–20)
CHLORIDE: 110 mmol/L (ref 101–111)
CO2: 22 mmol/L (ref 22–32)
Calcium: 8.3 mg/dL — ABNORMAL LOW (ref 8.9–10.3)
Creatinine, Ser: 3.77 mg/dL — ABNORMAL HIGH (ref 0.61–1.24)
GFR calc non Af Amer: 14 mL/min — ABNORMAL LOW (ref >60)
GFR, EST AFRICAN AMERICAN: 17 mL/min — AB (ref >60)
Glucose, Bld: 120 mg/dL — ABNORMAL HIGH (ref 65–99)
Potassium: 4.4 mmol/L (ref 3.5–5.1)
SODIUM: 139 mmol/L (ref 135–145)

## 2016-03-26 LAB — VANCOMYCIN, RANDOM: VANCOMYCIN RM: 21

## 2016-03-26 MED ORDER — TAMSULOSIN HCL 0.4 MG PO CAPS
0.4000 mg | ORAL_CAPSULE | Freq: Every day | ORAL | Status: DC
  Administered 2016-03-27 – 2016-03-28 (×2): 0.4 mg via ORAL
  Filled 2016-03-26 (×2): qty 1

## 2016-03-26 MED ORDER — ENOXAPARIN SODIUM 30 MG/0.3ML ~~LOC~~ SOLN
30.0000 mg | SUBCUTANEOUS | Status: DC
  Administered 2016-03-26: 30 mg via SUBCUTANEOUS
  Filled 2016-03-26: qty 0.3

## 2016-03-26 MED ORDER — PHENOL 1.4 % MT LIQD
1.0000 | OROMUCOSAL | Status: DC | PRN
  Filled 2016-03-26: qty 177

## 2016-03-26 MED ORDER — ATORVASTATIN CALCIUM 10 MG PO TABS
20.0000 mg | ORAL_TABLET | Freq: Every day | ORAL | Status: DC
  Administered 2016-03-26 – 2016-03-27 (×2): 20 mg via ORAL
  Filled 2016-03-26 (×2): qty 2

## 2016-03-26 MED ORDER — GABAPENTIN 100 MG PO CAPS
200.0000 mg | ORAL_CAPSULE | Freq: Three times a day (TID) | ORAL | Status: DC
  Administered 2016-03-26 (×2): 200 mg via ORAL
  Filled 2016-03-26 (×2): qty 2

## 2016-03-26 MED ORDER — DEXTROSE 5 % IV SOLN
2.0000 g | INTRAVENOUS | Status: DC
  Administered 2016-03-26 – 2016-03-28 (×3): 2 g via INTRAVENOUS
  Filled 2016-03-26 (×3): qty 2

## 2016-03-26 NOTE — Consult Note (Signed)
[place] for Infectious Disease    Date of Admission:  03/24/2016           Day 3 vancomycin        Day 3 piperacillin tazobactam       Reason for Consult: Streptococcal septic arthritis of left knee    Referring Physician: Dr. Lindwood Qua Primary Care Physician: Dr. Berneta Sages  Principal Problem:   Septic arthritis of knee, left [facility]) Active Problems:   Streptococcal bacteremia   Acute renal insufficiency   Hypertension   Hypothyroidism   Normocytic anemia   Thrombocytopenia (HCC)   Multilevel degenerative disc disease   GERD (gastroesophageal reflux disease)   Dyslipidemia   . allopurinol  300 mg Oral Daily  . Chlorhexidine Gluconate Cloth  6 each Topical Q0600  . diltiazem  420 mg Oral Daily  . enoxaparin (LOVENOX) injection  30 mg Subcutaneous Q24H  . ferrous sulfate  325 mg Oral TID PC  . gabapentin  300 mg Oral TID  . levothyroxine  50 mcg Oral QAC breakfast  . mupirocin ointment  1 application Nasal BID  . pantoprazole  40 mg Oral Daily  . piperacillin-tazobactam (ZOSYN)  IV  3.375 g Intravenous Q8H  . senna  1 tablet Oral BID  . simvastatin  40 mg Oral QPM  . tamsulosin  0.4 mg Oral Daily    Recommendations: 1. Start IV ceftriaxone 2. Discontinue piperacillin tazobactam 3. Check random vancomycin level 4. Recommend nephrology consult 5. Hold off on PICC placement   Assessment: [name] has acute septic arthritis of his left knee and streptococcal bacteremia. He has developed acute renal insufficiency. This could be due to multiple factors including his infection, vancomycin and other medications. His strep infection can be treated effectively with ceftriaxone and then transitioning to oral levofloxacin upon discharge. Because his creatinine continues to climb I would suggest a nephrology evaluation. I will follow-up tomorrow.    HPI: Zachary Mcdonald is a 76 y.o. male with a long history of degenerative disc and joint disease. He has  chronic low back pain but recently developed worsening low back pain and right hip pain. He was seen by Dr. Gladstone Lighter about one week ago and was started on a short prednisone taper and gabapentin. He felt a little bit better. On 03/23/2016 he was working at his computer and when he stood up he noticed sudden pain in his left knee. He took some acetaminophen. He woke about 3 AM with much more severe pain and fell while going to the bathroom. His wife could not get him up and he had to be brought here for evaluation. He was found to be febrile and had a left knee effusion. Cloudy fluid was aspirated. He underwent incision and drainage of his left knee yesterday. Gram stain of synovial fluid does not show any organisms and synovial fluid cultures are negative so far but one of 2 admission blood cultures has grown group G strep.   Review of Systems: Review of Systems  Constitutional: Positive for chills, fever and malaise/fatigue. Negative for diaphoresis and weight loss.  HENT: Negative for sore throat.   Respiratory: Negative for cough, sputum production and shortness of breath.   Cardiovascular: Negative for chest pain.  Gastrointestinal: Positive for abdominal pain and heartburn. Negative for diarrhea, nausea and vomiting.  Genitourinary: Negative for dysuria and frequency.  Musculoskeletal: Positive for back pain and joint pain. Negative for myalgias.  Skin: Negative for  rash.  Neurological: Negative for dizziness and headaches.    Past Medical History:  Diagnosis Date  . Arthritis    spine and hands  . Borderline diabetes   . Colon polyps    Tubular Adenoma   . GERD (gastroesophageal reflux disease)   . Gout   . Helicobacter pylori gastritis   . Hyperlipidemia   . Hypertension   . Hyperthyroidism     Social History  Substance Use Topics  . Smoking status: Never Smoker  . Smokeless tobacco: Never Used  . Alcohol use No    Family History  Problem Relation Age of Onset  .  Diabetes Mother   . Colon cancer Brother     43's  . Diabetes Brother    Allergies  Allergen Reactions  . Oxycontin [Oxycodone Hcl] Nausea And Vomiting and Other (See Comments)    Dizziness   . Valsartan-Hydrochlorothiazide Diarrhea    OBJECTIVE: Blood pressure (!) 119/56, pulse 71, temperature 98.9 F (37.2 C), temperature source Oral, resp. rate 20, height 6' (1.829 m), weight 210 lb (95.3 kg), SpO2 94 %.  Physical Exam  Constitutional: He is oriented to person, place, and time.  He is resting quietly in bed. He is uncomfortable due to pain. His wife is at the bedside.  Cardiovascular: Normal rate and regular rhythm.   No murmur heard. Distant heart sounds.  Pulmonary/Chest: Effort normal and breath sounds normal. He has no wheezes. He has no rales.  Abdominal: Soft. Bowel sounds are normal. He exhibits distension. There is no tenderness.  Musculoskeletal:  He has a gauze dressing around his left knee.  Neurological: He is alert and oriented to person, place, and time.  Skin: No rash noted.  Psychiatric: Mood and affect normal.    Lab Results Lab Results  Component Value Date   WBC 11.2 (H) 03/26/2016   HGB 11.6 (L) 03/26/2016   HCT 34.1 (L) 03/26/2016   MCV 91.4 03/26/2016   PLT 128 (L) 03/26/2016    Lab Results  Component Value Date   CREATININE 3.77 (H) 03/26/2016   BUN 48 (H) 03/26/2016   NA 139 03/26/2016   K 4.4 03/26/2016   CL 110 03/26/2016   CO2 22 03/26/2016    Lab Results  Component Value Date   ALT 28 03/25/2016   AST 47 (H) 03/25/2016   ALKPHOS 40 03/25/2016   BILITOT 1.0 03/25/2016     Microbiology: Recent Results (from the past 240 hour(s))  Blood culture (routine x 2)     Status: Abnormal   Collection Time: 03/24/16  6:38 AM  Result Value Ref Range Status   Specimen Description BLOOD RIGHT HAND  Final   Special Requests BOTTLES DRAWN AEROBIC AND ANAEROBIC 5ML  Final   Culture  Setup Time   Final    GRAM POSITIVE COCCI IN CHAINS IN  PAIRS ANAEROBIC BOTTLE ONLY CRITICAL RESULT CALLED TO, READ BACK BY AND VERIFIED WITH: BETH GREEN, PHARMD @0050  03/25/16 MKELLY,MLT Performed at [facility], [address], [place], [place] [address]    Culture STREPTOCOCCUS GROUP G (A)  Final   Report Status 03/26/2016 FINAL  Final   Organism ID, Bacteria STREPTOCOCCUS GROUP G  Final      Susceptibility   Streptococcus group g - MIC*    CLINDAMYCIN >=1 RESISTANT Resistant     AMPICILLIN <=0.25 SENSITIVE Sensitive     ERYTHROMYCIN >=8 RESISTANT Resistant     VANCOMYCIN 0.5 SENSITIVE Sensitive     CEFTRIAXONE <=0.12  SENSITIVE Sensitive     LEVOFLOXACIN 0.5 SENSITIVE Sensitive     * STREPTOCOCCUS GROUP G  Blood Culture ID Panel (Reflexed)     Status: Abnormal   Collection Time: 03/24/16  6:38 AM  Result Value Ref Range Status   Enterococcus species NOT DETECTED NOT DETECTED Final   Listeria monocytogenes NOT DETECTED NOT DETECTED Final   Staphylococcus species NOT DETECTED NOT DETECTED Final   Staphylococcus aureus NOT DETECTED NOT DETECTED Final   Streptococcus species DETECTED (A) NOT DETECTED Final    Comment: Not Enterococcus species, Streptococcus agalactiae, Streptococcus pyogenes, or Streptococcus pneumoniae. CRITICAL RESULT CALLED TO, READ BACK BY AND VERIFIED WITH: BETH GREEN, PHARMD @0050  03/25/16 MKELLY,MLT    Streptococcus agalactiae NOT DETECTED NOT DETECTED Final   Streptococcus pneumoniae NOT DETECTED NOT DETECTED Final   Streptococcus pyogenes NOT DETECTED NOT DETECTED Final   Acinetobacter baumannii NOT DETECTED NOT DETECTED Final   Enterobacteriaceae species NOT DETECTED NOT DETECTED Final   Enterobacter cloacae complex NOT DETECTED NOT DETECTED Final   Escherichia coli NOT DETECTED NOT DETECTED Final   Klebsiella oxytoca NOT DETECTED NOT DETECTED Final   Klebsiella pneumoniae NOT DETECTED NOT DETECTED Final   Proteus species NOT DETECTED NOT DETECTED Final   Serratia marcescens NOT DETECTED NOT DETECTED  Final   Haemophilus influenzae NOT DETECTED NOT DETECTED Final   Neisseria meningitidis NOT DETECTED NOT DETECTED Final   Pseudomonas aeruginosa NOT DETECTED NOT DETECTED Final   Candida albicans NOT DETECTED NOT DETECTED Final   Candida glabrata NOT DETECTED NOT DETECTED Final   Candida krusei NOT DETECTED NOT DETECTED Final   Candida parapsilosis NOT DETECTED NOT DETECTED Final   Candida tropicalis NOT DETECTED NOT DETECTED Final    Comment: Performed at [facility], [place] [address] [place], [place] [address]  Urine culture     Status: Abnormal   Collection Time: 03/24/16  6:48 AM  Result Value Ref Range Status   Specimen Description URINE, CLEAN CATCH  Final   Special Requests NONE  Final   Culture MULTIPLE SPECIES PRESENT, SUGGEST RECOLLECTION (A)  Final   Report Status 03/25/2016 FINAL  Final  Blood culture (routine x 2)     Status: None (Preliminary result)   Collection Time: 03/24/16  6:59 AM  Result Value Ref Range Status   Specimen Description BLOOD LEFT FOREARM  Final   Special Requests BOTTLES DRAWN AEROBIC AND ANAEROBIC 5ML  Final   Culture   Final    NO GROWTH 1 DAY Performed at [facility], [place] [address], [place], [place] [address]    Report Status PENDING  Incomplete  Body fluid culture     Status: None (Preliminary result)   Collection Time: 03/24/16  7:30 AM  Result Value Ref Range Status   Specimen Description KNEE LEFT  Final   Special Requests NONE  Final   Gram Stain   Final    WBC PRESENT,BOTH PMN AND MONONUCLEAR NO ORGANISMS SEEN CYTOSPIN SMEAR Gram Stain Report Called to,Read Back By and Verified With: ROSSER,M. RN @0859  ON 2.5.18 BY [facility]    Culture   Final    RARE STREPTOCOCCUS GROUP G Results Called to: RN Lollie Marrow [id number] MLM Performed at [facility], [place] [address], [place], [place] [address]    Report Status PENDING  Incomplete  Surgical pcr screen     Status: Abnormal   Collection Time: 03/24/16  2:32 PM  Result  Value Ref Range Status  MRSA, PCR POSITIVE (A) NEGATIVE Final    Comment: RESULT CALLED TO, READ BACK BY AND VERIFIED WITH: L.HUDSON RN AT 1950 ON 03/24/16 BY S.VANHOORNE    Staphylococcus aureus POSITIVE (A) NEGATIVE Final    Comment:        The Xpert SA Assay (FDA approved for NASAL specimens in patients over 44 years of age), is one component of a comprehensive surveillance program.  Test performance has been validated by [facility] for patients greater than or equal to 29 year old. It is not intended to diagnose infection nor to guide or monitor treatment.   Aerobic/Anaerobic Culture (surgical/deep wound)     Status: None (Preliminary result)   Collection Time: 03/25/16  4:33 PM  Result Value Ref Range Status   Specimen Description WOUND LEFT KNEE  Final   Special Requests NONE  Final   Gram Stain   Final    RARE WBC PRESENT, PREDOMINANTLY PMN NO ORGANISMS SEEN Performed at [facility], [address], [place], [place] [address]    Culture PENDING  Incomplete   Report Status PENDING  Incomplete    Michel Bickers, MD [facility] [place] [phone] pager   [phone] cell 03/26/2016, 11:00 AM

## 2016-03-26 NOTE — Procedures (Signed)
Foley Catheter Placement Note  Indications: 76 year old male admitted with septic arthritis found to have increasing creatinine with >1L on bladder scan. Nursing unable to place foley.  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  We injected lidocaine jelly per urethra prior to the procedure.  We then inserted a 18 French Coude catheter per urethra which met resistance likely in in the distal bulbar urethra likely just below the scrotum. We then passed a 14Fr coude catheter easily passed into the bladder.  We achieved return of clear yellow urine and then proceeded to insert 10 mL of sterile water into the Foley balloon.  The catheter was attached to a drainage bag and secured with a StatLock.  Placement of the catheter had return of greater than 1.2L of yellow, slight cloudy urine.               Complications: None; patient tolerated the procedure well.  Plan:   1.  Continue Foley catheter to drainage until follow up early next week with Dr. Matilde Sprang 2.  Start and continue flomax 0.16m daily       Attending Attestation: Dr. MMatilde Sprangwas available.

## 2016-03-26 NOTE — Consult Note (Signed)
Urology Consult  Referring physician: Reggy [facility] Reason for referral: Retention  Chief Complaint: Urine retention  History of Present Illness: Called to assess patient unable to pass catheter and 1 liter in bladder; admitted with septic knee arthritis; strep in blood and knee aspirate; ID seeing patient; Serum creatinine elevated from 1.2 to 3.77; no renal xray   Baseline: flow reasonable and stops and starts; voids re q2hours and once per  Night; NO GU surgery or UTI  Modifying factors: There are no other modifying factors  Associated signs and symptoms: There are no other associated signs and symptoms Aggravating and relieving factors: There are no other aggravating or relieving factors Severity: Moderate Duration: Persistent  Past Medical History:  Diagnosis Date  . Arthritis    spine and hands  . Borderline diabetes   . Colon polyps    Tubular Adenoma   . GERD (gastroesophageal reflux disease)   . Gout   . Helicobacter pylori gastritis   . Hyperlipidemia   . Hypertension   . Hyperthyroidism    Past Surgical History:  Procedure Laterality Date  . BACK SURGERY  2010  . BICEPS TENDON REPAIR Right 1999  . KNEE ARTHROSCOPY Left 03/25/2016   Procedure: ARTHROSCOPIC WASHOUT OF LEFT KNEE;  Surgeon: Latanya Maudlin, MD;  Location: [facility];  Service: Orthopedics;  Laterality: Left;  . MOLE REMOVAL     face  . VASECTOMY  1978    Medications: I have reviewed the patient's current medications. Allergies:  Allergies  Allergen Reactions  . Oxycontin [Oxycodone Hcl] Nausea And Vomiting and Other (See Comments)    Dizziness   . Valsartan-Hydrochlorothiazide Diarrhea    Family History  Problem Relation Age of Onset  . Diabetes Mother   . Colon cancer Brother     94's  . Diabetes Brother    Social History:  reports that he has never smoked. He has never used smokeless tobacco. He reports that he does not drink alcohol or use drugs.  ROS: All systems are reviewed and negative  except as noted. Rest negative  Physical Exam:  Vital signs in last 24 hours: Temp:  [98.3 F (36.8 C)-100.8 F (38.2 C)] 98.8 F (37.1 C) (02/07 1330) Pulse Rate:  [71-123] 77 (02/07 1330) Resp:  [18-42] 18 (02/07 1330) BP: (112-173)/(56-114) 121/65 (02/07 1330) SpO2:  [94 %-100 %] 96 % (02/07 1330)  Cardiovascular: Skin warm; not flushed Respiratory: Breaths quiet; no shortness of breath Abdomen: No masses Neurological: Normal sensation to touch Musculoskeletal: Normal motor function arms and legs Lymphatics: No inguinal adenopathy Skin: No rashes Genitourinary:foley in place with clear urine  Laboratory Data:  Results for orders placed or performed during the hospital encounter of 03/24/16 (from the past 72 hour(s))  CBC with Differential     Status: Abnormal   Collection Time: 03/24/16  6:38 AM  Result Value Ref Range   WBC 18.5 (H) 4.0 - 10.5 K/uL   RBC 4.57 4.22 - 5.81 MIL/uL   Hemoglobin 14.1 13.0 - 17.0 g/dL   HCT 39.8 39.0 - 52.0 %   MCV 87.1 78.0 - 100.0 fL   MCH 30.9 26.0 - 34.0 pg   MCHC 35.4 30.0 - 36.0 g/dL   RDW 13.6 11.5 - 15.5 %   Platelets 186 150 - 400 K/uL   Neutrophils Relative % 82 %   Neutro Abs 15.2 (H) 1.7 - 7.7 K/uL   Lymphocytes Relative 9 %   Lymphs Abs 1.6 0.7 - 4.0 K/uL   Monocytes Relative  9 %   Monocytes Absolute 1.8 (H) 0.1 - 1.0 K/uL   Eosinophils Relative 0 %   Eosinophils Absolute 0.0 0.0 - 0.7 K/uL   Basophils Relative 0 %   Basophils Absolute 0.0 0.0 - 0.1 K/uL  Blood culture (routine x 2)     Status: Abnormal   Collection Time: 03/24/16  6:38 AM  Result Value Ref Range   Specimen Description BLOOD RIGHT HAND    Special Requests BOTTLES DRAWN AEROBIC AND ANAEROBIC 5ML    Culture  Setup Time      GRAM POSITIVE COCCI IN CHAINS IN PAIRS ANAEROBIC BOTTLE ONLY CRITICAL RESULT CALLED TO, READ BACK BY AND VERIFIED WITH: BETH GREEN, PHARMD @0050  03/25/16 MKELLY,MLT Performed at [facility], [place] [address], [place],  [place] [address]    Culture STREPTOCOCCUS GROUP G (A)    Report Status 03/26/2016 FINAL    Organism ID, Bacteria STREPTOCOCCUS GROUP G       Susceptibility   Streptococcus group g - MIC*    CLINDAMYCIN >=1 RESISTANT Resistant     AMPICILLIN <=0.25 SENSITIVE Sensitive     ERYTHROMYCIN >=8 RESISTANT Resistant     VANCOMYCIN 0.5 SENSITIVE Sensitive     CEFTRIAXONE <=0.12 SENSITIVE Sensitive     LEVOFLOXACIN 0.5 SENSITIVE Sensitive     * STREPTOCOCCUS GROUP G  Blood Culture ID Panel (Reflexed)     Status: Abnormal   Collection Time: 03/24/16  6:38 AM  Result Value Ref Range   Enterococcus species NOT DETECTED NOT DETECTED   Listeria monocytogenes NOT DETECTED NOT DETECTED   Staphylococcus species NOT DETECTED NOT DETECTED   Staphylococcus aureus NOT DETECTED NOT DETECTED   Streptococcus species DETECTED (A) NOT DETECTED    Comment: Not Enterococcus species, Streptococcus agalactiae, Streptococcus pyogenes, or Streptococcus pneumoniae. CRITICAL RESULT CALLED TO, READ BACK BY AND VERIFIED WITH: BETH GREEN, PHARMD @0050  03/25/16 MKELLY,MLT    Streptococcus agalactiae NOT DETECTED NOT DETECTED   Streptococcus pneumoniae NOT DETECTED NOT DETECTED   Streptococcus pyogenes NOT DETECTED NOT DETECTED   Acinetobacter baumannii NOT DETECTED NOT DETECTED   Enterobacteriaceae species NOT DETECTED NOT DETECTED   Enterobacter cloacae complex NOT DETECTED NOT DETECTED   Escherichia coli NOT DETECTED NOT DETECTED   Klebsiella oxytoca NOT DETECTED NOT DETECTED   Klebsiella pneumoniae NOT DETECTED NOT DETECTED   Proteus species NOT DETECTED NOT DETECTED   Serratia marcescens NOT DETECTED NOT DETECTED   Haemophilus influenzae NOT DETECTED NOT DETECTED   Neisseria meningitidis NOT DETECTED NOT DETECTED   Pseudomonas aeruginosa NOT DETECTED NOT DETECTED   Candida albicans NOT DETECTED NOT DETECTED   Candida glabrata NOT DETECTED NOT DETECTED   Candida krusei NOT DETECTED NOT DETECTED   Candida parapsilosis  NOT DETECTED NOT DETECTED   Candida tropicalis NOT DETECTED NOT DETECTED    Comment: Performed at [facility] [address], [place], [place] [address]  Urinalysis, Routine w reflex microscopic     Status: None   Collection Time: 03/24/16  6:48 AM  Result Value Ref Range   Color, Urine YELLOW YELLOW   APPearance CLEAR CLEAR   Specific Gravity, Urine 1.017 1.005 - 1.030   pH 5.0 5.0 - 8.0   Glucose, UA NEGATIVE NEGATIVE mg/dL   Hgb urine dipstick NEGATIVE NEGATIVE   Bilirubin Urine NEGATIVE NEGATIVE   Ketones, ur NEGATIVE NEGATIVE mg/dL   Protein, ur NEGATIVE NEGATIVE mg/dL   Nitrite NEGATIVE NEGATIVE   Leukocytes, UA NEGATIVE NEGATIVE  Urine culture  Status: Abnormal   Collection Time: 03/24/16  6:48 AM  Result Value Ref Range   Specimen Description URINE, CLEAN CATCH    Special Requests NONE    Culture MULTIPLE SPECIES PRESENT, SUGGEST RECOLLECTION (A)    Report Status 03/25/2016 FINAL   Blood culture (routine x 2)     Status: None (Preliminary result)   Collection Time: 03/24/16  6:59 AM  Result Value Ref Range   Specimen Description BLOOD LEFT FOREARM    Special Requests BOTTLES DRAWN AEROBIC AND ANAEROBIC 5ML    Culture      NO GROWTH 1 DAY Performed at [facility], [place] [address], [place], [place] [address]    Report Status PENDING   Synovial cell count + diff, w/ crystals     Status: Abnormal   Collection Time: 03/24/16  7:00 AM  Result Value Ref Range   Color, Synovial YELLOW YELLOW   Appearance-Synovial TURBID (A) CLEAR   Crystals, Fluid NO CRYSTALS SEEN    WBC, Synovial 102,375 (H) 0 - 200 /cu mm   Neutrophil, Synovial 82 (H) 0 - 25 %   Monocyte-Macrophage-Synovial Fluid 17 (L) 50 - 90 %  I-Stat CG4 Lactic Acid, ED     Status: Abnormal   Collection Time: 03/24/16  7:10 AM  Result Value Ref Range   Lactic Acid, Venous 2.10 (HH) 0.5 - 1.9 mmol/L   Comment NOTIFIED PHYSICIAN   Anaerobic culture     Status: None (Preliminary result)    Collection Time: 03/24/16  7:20 AM  Result Value Ref Range   Specimen Description KNEE LEFT    Special Requests NONE    Culture      NO ANAEROBES ISOLATED; CULTURE IN PROGRESS FOR 5 DAYS   Report Status PENDING   Body fluid culture     Status: None (Preliminary result)   Collection Time: 03/24/16  7:30 AM  Result Value Ref Range   Specimen Description KNEE LEFT    Special Requests NONE    Gram Stain      WBC PRESENT,BOTH PMN AND MONONUCLEAR NO ORGANISMS SEEN CYTOSPIN SMEAR Gram Stain Report Called to,Read Back By and Verified With: ROSSER,M. RN @0859  ON 2.5.18 BY [facility]    Culture      RARE STREPTOCOCCUS GROUP G Results Called to: RN Lollie Marrow [phone] MLM Performed at [facility], [address] [place], [place] [address]    Report Status PENDING   Comprehensive metabolic panel     Status: Abnormal   Collection Time: 03/24/16  7:31 AM  Result Value Ref Range   Sodium 135 135 - 145 mmol/L   Potassium 3.1 (L) 3.5 - 5.1 mmol/L   Chloride 100 (L) 101 - 111 mmol/L   CO2 28 22 - 32 mmol/L   Glucose, Bld 163 (H) 65 - 99 mg/dL   BUN 31 (H) 6 - 20 mg/dL   Creatinine, Ser 1.25 (H) 0.61 - 1.24 mg/dL   Calcium 8.8 (L) 8.9 - 10.3 mg/dL   Total Protein 6.2 (L) 6.5 - 8.1 g/dL   Albumin 3.8 3.5 - 5.0 g/dL   AST 16 15 - 41 U/L   ALT 22 17 - 63 U/L   Alkaline Phosphatase 44 38 - 126 U/L   Total Bilirubin 1.0 0.3 - 1.2 mg/dL   GFR calc non Af Amer 55 (L) >60 mL/min   GFR calc Af Amer >60 >60 mL/min    Comment: (NOTE) The eGFR has been calculated using the  CKD EPI equation. This calculation has not been validated in all clinical situations. eGFR's persistently <60 mL/min signify possible Chronic Kidney Disease.    Anion gap 7 5 - 15  Lactic acid, plasma     Status: None   Collection Time: 03/24/16 12:56 PM  Result Value Ref Range   Lactic Acid, Venous 1.0 0.5 - 1.9 mmol/L  CBC     Status: Abnormal   Collection Time: 03/24/16  1:05 PM  Result Value Ref Range   WBC  20.5 (H) 4.0 - 10.5 K/uL   RBC 4.05 (L) 4.22 - 5.81 MIL/uL   Hemoglobin 13.2 13.0 - 17.0 g/dL   HCT 36.5 (L) 39.0 - 52.0 %   MCV 90.1 78.0 - 100.0 fL   MCH 32.6 26.0 - 34.0 pg   MCHC 36.2 (H) 30.0 - 36.0 g/dL   RDW 13.8 11.5 - 15.5 %   Platelets 166 150 - 400 K/uL  Creatinine, serum     Status: None   Collection Time: 03/24/16  1:05 PM  Result Value Ref Range   Creatinine, Ser 1.11 0.61 - 1.24 mg/dL   GFR calc non Af Amer >60 >60 mL/min   GFR calc Af Amer >60 >60 mL/min    Comment: (NOTE) The eGFR has been calculated using the CKD EPI equation. This calculation has not been validated in all clinical situations. eGFR's persistently <60 mL/min signify possible Chronic Kidney Disease.   Procalcitonin     Status: None   Collection Time: 03/24/16  1:05 PM  Result Value Ref Range   Procalcitonin 3.17 ng/mL    Comment:        Interpretation: PCT > 2 ng/mL: Systemic infection (sepsis) is likely, unless other causes are known. (NOTE)         ICU PCT Algorithm               Non ICU PCT Algorithm    ----------------------------     ------------------------------         PCT < 0.25 ng/mL                 PCT < 0.1 ng/mL     Stopping of antibiotics            Stopping of antibiotics       strongly encouraged.               strongly encouraged.    ----------------------------     ------------------------------       PCT level decrease by               PCT < 0.25 ng/mL       >= 80% from peak PCT       OR PCT 0.25 - 0.5 ng/mL          Stopping of antibiotics                                             encouraged.     Stopping of antibiotics           encouraged.    ----------------------------     ------------------------------       PCT level decrease by              PCT >= 0.25 ng/mL       < 80% from peak PCT  AND PCT >= 0.5 ng/mL            Continuing antibiotics                                               encouraged.       Continuing antibiotics            encouraged.     ----------------------------     ------------------------------     PCT level increase compared          PCT > 0.5 ng/mL         with peak PCT AND          PCT >= 0.5 ng/mL             Escalation of antibiotics                                          strongly encouraged.      Escalation of antibiotics        strongly encouraged.   Surgical pcr screen     Status: Abnormal   Collection Time: 03/24/16  2:32 PM  Result Value Ref Range   MRSA, PCR POSITIVE (A) NEGATIVE    Comment: RESULT CALLED TO, READ BACK BY AND VERIFIED WITH: L.HUDSON RN AT 1950 ON 03/24/16 BY S.VANHOORNE    Staphylococcus aureus POSITIVE (A) NEGATIVE    Comment:        The Xpert SA Assay (FDA approved for NASAL specimens in patients over 37 years of age), is one component of a comprehensive surveillance program.  Test performance has been validated by [facility] for patients greater than or equal to 31 year old. It is not intended to diagnose infection nor to guide or monitor treatment.   Glucose, capillary     Status: Abnormal   Collection Time: 03/24/16  9:07 PM  Result Value Ref Range   Glucose-Capillary 131 (H) 65 - 99 mg/dL  Comprehensive metabolic panel     Status: Abnormal   Collection Time: 03/25/16  5:28 AM  Result Value Ref Range   Sodium 137 135 - 145 mmol/L   Potassium 4.4 3.5 - 5.1 mmol/L    Comment: RESULT REPEATED AND VERIFIED DELTA CHECK NOTED NO VISIBLE HEMOLYSIS    Chloride 109 101 - 111 mmol/L   CO2 20 (L) 22 - 32 mmol/L   Glucose, Bld 154 (H) 65 - 99 mg/dL   BUN 32 (H) 6 - 20 mg/dL   Creatinine, Ser 2.59 (H) 0.61 - 1.24 mg/dL    Comment: RESULT REPEATED AND VERIFIED DELTA CHECK NOTED    Calcium 8.2 (L) 8.9 - 10.3 mg/dL   Total Protein 5.3 (L) 6.5 - 8.1 g/dL   Albumin 3.0 (L) 3.5 - 5.0 g/dL   AST 52 (H) 15 - 41 U/L   ALT 27 17 - 63 U/L   Alkaline Phosphatase 37 (L) 38 - 126 U/L   Total Bilirubin 1.6 (H) 0.3 - 1.2 mg/dL   GFR calc non Af Amer 23 (L) >60 mL/min   GFR calc Af  Amer 26 (L) >60 mL/min    Comment: (NOTE) The eGFR has been calculated using the CKD EPI equation. This calculation has not been validated in all clinical situations. eGFR's  persistently <60 mL/min signify possible Chronic Kidney Disease.    Anion gap 8 5 - 15  Protime-INR     Status: Abnormal   Collection Time: 03/25/16  5:28 AM  Result Value Ref Range   Prothrombin Time 16.9 (H) 11.4 - 15.2 seconds   INR 1.36   CBC WITH DIFFERENTIAL     Status: Abnormal   Collection Time: 03/25/16  5:28 AM  Result Value Ref Range   WBC 15.8 (H) 4.0 - 10.5 K/uL   RBC 3.84 (L) 4.22 - 5.81 MIL/uL   Hemoglobin 12.5 (L) 13.0 - 17.0 g/dL   HCT 35.0 (L) 39.0 - 52.0 %   MCV 91.1 78.0 - 100.0 fL   MCH 32.6 26.0 - 34.0 pg   MCHC 35.7 30.0 - 36.0 g/dL   RDW 14.3 11.5 - 15.5 %   Platelets 139 (L) 150 - 400 K/uL   Neutrophils Relative % 82 %   Neutro Abs 12.9 (H) 1.7 - 7.7 K/uL   Lymphocytes Relative 9 %   Lymphs Abs 1.4 0.7 - 4.0 K/uL   Monocytes Relative 9 %   Monocytes Absolute 1.5 (H) 0.1 - 1.0 K/uL   Eosinophils Relative 0 %   Eosinophils Absolute 0.0 0.0 - 0.7 K/uL   Basophils Relative 0 %   Basophils Absolute 0.0 0.0 - 0.1 K/uL  Vancomycin, trough     Status: Abnormal   Collection Time: 03/25/16  2:31 PM  Result Value Ref Range   Vancomycin Tr 35 (HH) 15 - 20 ug/mL    Comment: CRITICAL RESULT CALLED TO, READ BACK BY AND VERIFIED WITH: D.DARK RN AT 9628 ON 03/25/16 BY S.VANHOORNE   Aerobic/Anaerobic Culture (surgical/deep wound)     Status: None (Preliminary result)   Collection Time: 03/25/16  4:33 PM  Result Value Ref Range   Specimen Description WOUND LEFT KNEE    Special Requests NONE    Gram Stain      RARE WBC PRESENT, PREDOMINANTLY PMN NO ORGANISMS SEEN Performed at [facility], [place] [address] [place], [place] [address]    Culture PENDING    Report Status PENDING   Comprehensive metabolic panel     Status: Abnormal   Collection Time: 03/25/16  7:00 PM  Result Value Ref  Range   Sodium 140 135 - 145 mmol/L   Potassium 4.6 3.5 - 5.1 mmol/L   Chloride 114 (H) 101 - 111 mmol/L   CO2 17 (L) 22 - 32 mmol/L   Glucose, Bld 149 (H) 65 - 99 mg/dL   BUN 40 (H) 6 - 20 mg/dL   Creatinine, Ser 3.05 (H) 0.61 - 1.24 mg/dL   Calcium 8.0 (L) 8.9 - 10.3 mg/dL   Total Protein 5.5 (L) 6.5 - 8.1 g/dL   Albumin 2.9 (L) 3.5 - 5.0 g/dL   AST 47 (H) 15 - 41 U/L   ALT 28 17 - 63 U/L   Alkaline Phosphatase 40 38 - 126 U/L   Total Bilirubin 1.0 0.3 - 1.2 mg/dL   GFR calc non Af Amer 19 (L) >60 mL/min   GFR calc Af Amer 22 (L) >60 mL/min    Comment: (NOTE) The eGFR has been calculated using the CKD EPI equation. This calculation has not been validated in all clinical situations. eGFR's persistently <60 mL/min signify possible Chronic Kidney Disease.    Anion gap 9 5 - 15  CBC     Status: Abnormal   Collection Time: 03/25/16  7:05 PM  Result  Value Ref Range   WBC 12.1 (H) 4.0 - 10.5 K/uL   RBC 3.84 (L) 4.22 - 5.81 MIL/uL   Hemoglobin 12.0 (L) 13.0 - 17.0 g/dL   HCT 34.7 (L) 39.0 - 52.0 %   MCV 90.4 78.0 - 100.0 fL   MCH 31.3 26.0 - 34.0 pg   MCHC 34.6 30.0 - 36.0 g/dL   RDW 14.4 11.5 - 15.5 %   Platelets 135 (L) 150 - 400 K/uL  Creatinine, serum     Status: Abnormal   Collection Time: 03/25/16  7:05 PM  Result Value Ref Range   Creatinine, Ser 3.11 (H) 0.61 - 1.24 mg/dL   GFR calc non Af Amer 18 (L) >60 mL/min   GFR calc Af Amer 21 (L) >60 mL/min    Comment: (NOTE) The eGFR has been calculated using the CKD EPI equation. This calculation has not been validated in all clinical situations. eGFR's persistently <60 mL/min signify possible Chronic Kidney Disease.   Basic metabolic panel     Status: Abnormal   Collection Time: 03/26/16  5:53 AM  Result Value Ref Range   Sodium 139 135 - 145 mmol/L   Potassium 4.4 3.5 - 5.1 mmol/L   Chloride 110 101 - 111 mmol/L   CO2 22 22 - 32 mmol/L   Glucose, Bld 120 (H) 65 - 99 mg/dL   BUN 48 (H) 6 - 20 mg/dL   Creatinine,  Ser 3.77 (H) 0.61 - 1.24 mg/dL   Calcium 8.3 (L) 8.9 - 10.3 mg/dL   GFR calc non Af Amer 14 (L) >60 mL/min   GFR calc Af Amer 17 (L) >60 mL/min    Comment: (NOTE) The eGFR has been calculated using the CKD EPI equation. This calculation has not been validated in all clinical situations. eGFR's persistently <60 mL/min signify possible Chronic Kidney Disease.    Anion gap 7 5 - 15  CBC     Status: Abnormal   Collection Time: 03/26/16  5:53 AM  Result Value Ref Range   WBC 11.2 (H) 4.0 - 10.5 K/uL   RBC 3.73 (L) 4.22 - 5.81 MIL/uL   Hemoglobin 11.6 (L) 13.0 - 17.0 g/dL   HCT 34.1 (L) 39.0 - 52.0 %   MCV 91.4 78.0 - 100.0 fL   MCH 31.1 26.0 - 34.0 pg   MCHC 34.0 30.0 - 36.0 g/dL   RDW 14.4 11.5 - 15.5 %   Platelets 128 (L) 150 - 400 K/uL  Vancomycin, random     Status: None   Collection Time: 03/26/16 11:31 AM  Result Value Ref Range   Vancomycin Rm 21     Comment:        Random Vancomycin therapeutic range is dependent on dosage and time of specimen collection. A peak range is 20.0-40.0 ug/mL A trough range is 5.0-15.0 ug/mL           Recent Results (from the past 240 hour(s))  Blood culture (routine x 2)     Status: Abnormal   Collection Time: 03/24/16  6:38 AM  Result Value Ref Range Status   Specimen Description BLOOD RIGHT HAND  Final   Special Requests BOTTLES DRAWN AEROBIC AND ANAEROBIC 5ML  Final   Culture  Setup Time   Final    GRAM POSITIVE COCCI IN CHAINS IN PAIRS ANAEROBIC BOTTLE ONLY CRITICAL RESULT CALLED TO, READ BACK BY AND VERIFIED WITH: BETH GREEN, PHARMD @0050  03/25/16 MKELLY,MLT Performed at [facility], [place],  [place] [address]    Culture STREPTOCOCCUS GROUP G (A)  Final   Report Status 03/26/2016 FINAL  Final   Organism ID, Bacteria STREPTOCOCCUS GROUP G  Final      Susceptibility   Streptococcus group g - MIC*    CLINDAMYCIN >=1 RESISTANT Resistant     AMPICILLIN <=0.25 SENSITIVE Sensitive     ERYTHROMYCIN >=8  RESISTANT Resistant     VANCOMYCIN 0.5 SENSITIVE Sensitive     CEFTRIAXONE <=0.12 SENSITIVE Sensitive     LEVOFLOXACIN 0.5 SENSITIVE Sensitive     * STREPTOCOCCUS GROUP G  Blood Culture ID Panel (Reflexed)     Status: Abnormal   Collection Time: 03/24/16  6:38 AM  Result Value Ref Range Status   Enterococcus species NOT DETECTED NOT DETECTED Final   Listeria monocytogenes NOT DETECTED NOT DETECTED Final   Staphylococcus species NOT DETECTED NOT DETECTED Final   Staphylococcus aureus NOT DETECTED NOT DETECTED Final   Streptococcus species DETECTED (A) NOT DETECTED Final    Comment: Not Enterococcus species, Streptococcus agalactiae, Streptococcus pyogenes, or Streptococcus pneumoniae. CRITICAL RESULT CALLED TO, READ BACK BY AND VERIFIED WITH: BETH GREEN, PHARMD @0050  03/25/16 MKELLY,MLT    Streptococcus agalactiae NOT DETECTED NOT DETECTED Final   Streptococcus pneumoniae NOT DETECTED NOT DETECTED Final   Streptococcus pyogenes NOT DETECTED NOT DETECTED Final   Acinetobacter baumannii NOT DETECTED NOT DETECTED Final   Enterobacteriaceae species NOT DETECTED NOT DETECTED Final   Enterobacter cloacae complex NOT DETECTED NOT DETECTED Final   Escherichia coli NOT DETECTED NOT DETECTED Final   Klebsiella oxytoca NOT DETECTED NOT DETECTED Final   Klebsiella pneumoniae NOT DETECTED NOT DETECTED Final   Proteus species NOT DETECTED NOT DETECTED Final   Serratia marcescens NOT DETECTED NOT DETECTED Final   Haemophilus influenzae NOT DETECTED NOT DETECTED Final   Neisseria meningitidis NOT DETECTED NOT DETECTED Final   Pseudomonas aeruginosa NOT DETECTED NOT DETECTED Final   Candida albicans NOT DETECTED NOT DETECTED Final   Candida glabrata NOT DETECTED NOT DETECTED Final   Candida krusei NOT DETECTED NOT DETECTED Final   Candida parapsilosis NOT DETECTED NOT DETECTED Final   Candida tropicalis NOT DETECTED NOT DETECTED Final    Comment: Performed at [facility], [place] [address], [place], [place] [address]  Urine culture     Status: Abnormal   Collection Time: 03/24/16  6:48 AM  Result Value Ref Range Status   Specimen Description URINE, CLEAN CATCH  Final   Special Requests NONE  Final   Culture MULTIPLE SPECIES PRESENT, SUGGEST RECOLLECTION (A)  Final   Report Status 03/25/2016 FINAL  Final  Blood culture (routine x 2)     Status: None (Preliminary result)   Collection Time: 03/24/16  6:59 AM  Result Value Ref Range Status   Specimen Description BLOOD LEFT FOREARM  Final   Special Requests BOTTLES DRAWN AEROBIC AND ANAEROBIC 5ML  Final   Culture   Final    NO GROWTH 1 DAY Performed at [facility], [place] [address] [place], [place] [address]    Report Status PENDING  Incomplete  Anaerobic culture     Status: None (Preliminary result)   Collection Time: 03/24/16  7:20 AM  Result Value Ref Range Status   Specimen Description KNEE LEFT  Final   Special Requests NONE  Final   Culture   Final    NO ANAEROBES ISOLATED; CULTURE IN PROGRESS FOR 5 DAYS   Report Status PENDING  Incomplete  Body fluid  culture     Status: None (Preliminary result)   Collection Time: 03/24/16  7:30 AM  Result Value Ref Range Status   Specimen Description KNEE LEFT  Final   Special Requests NONE  Final   Gram Stain   Final    WBC PRESENT,BOTH PMN AND MONONUCLEAR NO ORGANISMS SEEN CYTOSPIN SMEAR Gram Stain Report Called to,Read Back By and Verified With: ROSSER,M. RN @0859  ON 2.5.18 BY [facility]    Culture   Final    RARE STREPTOCOCCUS GROUP G Results Called to: RN Lollie Marrow [id number] MLM Performed at [facility], [address], [place], [place] [address]    Report Status PENDING  Incomplete  Surgical pcr screen     Status: Abnormal   Collection Time: 03/24/16  2:32 PM  Result Value Ref Range Status   MRSA, PCR POSITIVE (A) NEGATIVE Final    Comment: RESULT CALLED TO, READ BACK BY AND VERIFIED WITH: L.HUDSON RN AT 1950 ON 03/24/16 BY S.VANHOORNE     Staphylococcus aureus POSITIVE (A) NEGATIVE Final    Comment:        The Xpert SA Assay (FDA approved for NASAL specimens in patients over 56 years of age), is one component of a comprehensive surveillance program.  Test performance has been validated by [facility] for patients greater than or equal to 38 year old. It is not intended to diagnose infection nor to guide or monitor treatment.   Aerobic/Anaerobic Culture (surgical/deep wound)     Status: None (Preliminary result)   Collection Time: 03/25/16  4:33 PM  Result Value Ref Range Status   Specimen Description WOUND LEFT KNEE  Final   Special Requests NONE  Final   Gram Stain   Final    RARE WBC PRESENT, PREDOMINANTLY PMN NO ORGANISMS SEEN Performed at [facility], [address] [place], [place] [address]    Culture PENDING  Incomplete   Report Status PENDING  Incomplete   Creatinine:  Recent Labs  03/24/16 0731 03/24/16 1305 03/25/16 0528 03/25/16 1900 03/25/16 1905 03/26/16 0553  CREATININE 1.25* 1.11 2.59* 3.05* 3.11* 3.77*    Xrays: See report/chart none  Impression/Assessment:  BPH and retention Pathophysiology discussed  Plan:  Send home with foley on flomax daily See me next week for an EARLY am visit on flomax and foley for trial of voiding Phone number given I presribed the flomax  Merland Holness A 03/26/2016, 2:33 PM

## 2016-03-26 NOTE — Plan of Care (Signed)
Problem: Fluid Volume: Goal: Ability to maintain a balanced intake and output will improve Outcome: Not Progressing Pt has very little output-foley cath was inserted  Problem: Bowel/Gastric: Goal: Will not experience complications related to bowel motility Outcome: Not Progressing Pt constipated- Laxative given

## 2016-03-26 NOTE — Progress Notes (Signed)
Subjective: 1 Day Post-Op Procedure(s) (LRB): ARTHROSCOPIC WASHOUT OF LEFT KNEE (Left) Patient reports pain as 2 on 0-10 scale. Doing much better this morning. No drainage from hemovac so it  Was DCd. His knee looks much better . He is on LOVENOX . My main concern is the fact that his Renal functions have been increasing and he is on Vancomycin. I called DR. Campbell,infectious disease and he was kind enough to assist in the management of his infection. We may need to have Nephrology evaluate him as well. We will continue to follow his Renal Function tests.His WBC is decreasing and he is afebrile.  Objective: Vital signs in last 24 hours: Temp:  [98.3 F (36.8 C)-101.7 F (38.7 C)] 98.9 F (37.2 C) (02/07 0516) Pulse Rate:  [71-123] 71 (02/07 0516) Resp:  [20-42] 20 (02/07 0516) BP: (112-173)/(56-114) 119/56 (02/07 0516) SpO2:  [94 %-100 %] 94 % (02/07 0516)  Intake/Output from previous day: 02/06 0701 - 02/07 0700 In: 3166.7 [I.V.:3016.7; IV Piggyback:150] Out: 485 [Urine:475; Blood:10] Intake/Output this shift: No intake/output data recorded.   Recent Labs  03/24/16 0638 03/24/16 1305 03/25/16 0528 03/25/16 1905 03/26/16 0553  HGB 14.1 13.2 12.5* 12.0* 11.6*    Recent Labs  03/25/16 1905 03/26/16 0553  WBC 12.1* 11.2*  RBC 3.84* 3.73*  HCT 34.7* 34.1*  PLT 135* 128*    Recent Labs  03/25/16 1900 03/25/16 1905 03/26/16 0553  NA 140  --  139  K 4.6  --  4.4  CL 114*  --  110  CO2 17*  --  22  BUN 40*  --  48*  CREATININE 3.05* 3.11* 3.77*  GLUCOSE 149*  --  120*  CALCIUM 8.0*  --  8.3*    Recent Labs  03/25/16 0528  INR 1.36    No cellulitis present Compartment soft  Assessment/Plan: 1 Day Post-Op Procedure(s) (LRB): ARTHROSCOPIC WASHOUT OF LEFT KNEE (Left) Up with therapy  Romaine Maciolek A 03/26/2016, 8:22 AM

## 2016-03-26 NOTE — Progress Notes (Signed)
PROGRESS NOTE    Zachary Mcdonald   [id number]  DOB: 1940-09-17  DOA: 03/24/2016 PCP: Tivis Ringer, MD   Brief Narrative:  [name] a 76 y.o.male,With past medical history of arthritis, gout, GERD, hypertension, chronic lower back pain, he presents to ED secondary to left knee pain, chills, patient reports is been having worsening lower back pain over last few weeks recently seen by Dr. Dellis Filbert, given steroid taper. Presented with left knee pain where he fell due to his knee giving out and was unable to get up from the floor  ED workup significant for fever 102, tachycardia, leukocytosis, left synovial fluid arthrocentesis significant for leukocytosis of 102 K,  no crystals.  Subjective:  no pain in knee- no nausea, vomiting diarrhea. No abdominal discomfort despite having > 900 cc of urine in bladder  Assessment & Plan:   Principal Problem:   Septic arthritis of knee, left - growing strep Gp G in blood and from knee aspirate - ID assisting with management- s/p I and D on 2/6 by Dr Gladstone Lighter - Pt eval  Active Problems:      Acute renal insufficiency - determined to be due to BOO - Cr trended up from 1.2 to 3.77- bladder scan reveals > 900 cc of urine - RNs having significant difficult with placing foley or Coude cath- have called Urology today     Normocytic anemia - stable    Thrombocytopenia  - possibly due to sepsis- follow    GERD (gastroesophageal reflux disease) - Protonix    Dyslipidemia - Lipitor    Hypertension -  Cardizem 420 mg daily - benicar/ HCTZ is on hold    Hypothyroidism - synthroid  DVT prophylaxis: Lovenox Code Status: Full code Family Communication:  Disposition Plan: home Consultants:   Ortho  ID  Urology Procedures:   I and D of left knee 2/6 Antimicrobials:  Anti-infectives    Start     Dose/Rate Route Frequency Ordered Stop   03/26/16 1200  cefTRIAXone (ROCEPHIN) 2 g in dextrose 5 % 50 mL IVPB     2  g 100 mL/hr over 30 Minutes Intravenous Every 24 hours 03/26/16 1111     03/25/16 2200  clarithromycin (BIAXIN) tablet 500 mg  Status:  Discontinued     500 mg Oral 2 times daily 03/25/16 1843 03/25/16 1922   03/25/16 1930  vancomycin (VANCOCIN) IVPB 1000 mg/200 mL premix  Status:  Discontinued     1,000 mg 200 mL/hr over 60 Minutes Intravenous Every 12 hours 03/25/16 1843 03/25/16 1913   03/25/16 1845  metroNIDAZOLE (FLAGYL) tablet 250 mg  Status:  Discontinued     250 mg Oral 4 times daily 03/25/16 1843 03/25/16 1923   03/25/16 0600  ceFAZolin (ANCEF) IVPB 2g/100 mL premix  Status:  Discontinued     2 g 200 mL/hr over 30 Minutes Intravenous On call to O.R. 03/24/16 1340 03/25/16 1828   03/24/16 2200  piperacillin-tazobactam (ZOSYN) IVPB 3.375 g  Status:  Discontinued     3.375 g 12.5 mL/hr over 240 Minutes Intravenous Every 8 hours 03/24/16 1115 03/26/16 1111   03/24/16 1600  vancomycin (VANCOCIN) 1,250 mg in sodium chloride 0.9 % 250 mL IVPB  Status:  Discontinued     1,250 mg 166.7 mL/hr over 90 Minutes Intravenous Every 12 hours 03/24/16 1118 03/25/16 0823   03/24/16 1130  piperacillin-tazobactam (ZOSYN) IVPB 3.375 g     3.375 g 100 mL/hr over 30 Minutes Intravenous  Once 03/24/16 1115 03/24/16 1448  03/24/16 0730  ceFAZolin (ANCEF) IVPB 1 g/50 mL premix     1 g 100 mL/hr over 30 Minutes Intravenous  Once 03/24/16 0727 03/24/16 0837   03/24/16 0730  vancomycin (VANCOCIN) IVPB 1000 mg/200 mL premix     1,000 mg 200 mL/hr over 60 Minutes Intravenous  Once 03/24/16 0727 03/24/16 0957       Objective: Vitals:   03/25/16 1840 03/25/16 2203 03/26/16 0200 03/26/16 0516  BP: 112/60 (!) 163/71  (!) 119/56  Pulse: 88 88  71  Resp: (!) 29 (!) 24  20  Temp: (!) 100.8 F (38.2 C) 98.3 F (36.8 C) 99.2 F (37.3 C) 98.9 F (37.2 C)  TempSrc:  Oral Oral Oral  SpO2: 97% 95%  94%  Weight:      Height:        Intake/Output Summary (Last 24 hours) at 03/26/16 1305 Last data filed  at 03/26/16 0932  Gross per 24 hour  Intake          3226.67 ml  Output              485 ml  Net          2741.67 ml   Filed Weights   03/24/16 1040  Weight: 95.3 kg (210 lb)    Examination: General exam: Appears comfortable  HEENT: PERRLA, oral mucosa moist, no sclera icterus or thrush Respiratory system: Clear to auscultation. Respiratory effort normal. Cardiovascular system: S1 & S2 heard, RRR.  No murmurs  Gastrointestinal system: Abdomen soft, non-tender, nondistended. Normal bowel sound. No organomegaly Central nervous system: Alert and oriented. No focal neurological deficits. Extremities: No cyanosis, clubbing or edema- left knee in dressing Skin: No rashes or ulcers Psychiatry:  Mood & affect appropriate.     Data Reviewed: I have personally reviewed following labs and imaging studies  CBC:  Recent Labs Lab 03/24/16 0638 03/24/16 1305 03/25/16 0528 03/25/16 1905 03/26/16 0553  WBC 18.5* 20.5* 15.8* 12.1* 11.2*  NEUTROABS 15.2*  --  12.9*  --   --   HGB 14.1 13.2 12.5* 12.0* 11.6*  HCT 39.8 36.5* 35.0* 34.7* 34.1*  MCV 87.1 90.1 91.1 90.4 91.4  PLT 186 166 139* 135* 0000000*   Basic Metabolic Panel:  Recent Labs Lab 03/24/16 0731 03/24/16 1305 03/25/16 0528 03/25/16 1900 03/25/16 1905 03/26/16 0553  NA 135  --  137 140  --  139  K 3.1*  --  4.4 4.6  --  4.4  CL 100*  --  109 114*  --  110  CO2 28  --  20* 17*  --  22  GLUCOSE 163*  --  154* 149*  --  120*  BUN 31*  --  32* 40*  --  48*  CREATININE 1.25* 1.11 2.59* 3.05* 3.11* 3.77*  CALCIUM 8.8*  --  8.2* 8.0*  --  8.3*   GFR: Estimated Creatinine Clearance: 20.3 mL/min (by C-G formula based on SCr of 3.77 mg/dL (H)). Liver Function Tests:  Recent Labs Lab 03/24/16 0731 03/25/16 0528 03/25/16 1900  AST 16 52* 47*  ALT 22 27 28   ALKPHOS 44 37* 40  BILITOT 1.0 1.6* 1.0  PROT 6.2* 5.3* 5.5*  ALBUMIN 3.8 3.0* 2.9*   No results for input(s): LIPASE, AMYLASE in the last 168 hours. No results  for input(s): AMMONIA in the last 168 hours. Coagulation Profile:  Recent Labs Lab 03/25/16 0528  INR 1.36   Cardiac Enzymes: No results for input(s): CKTOTAL, CKMB, CKMBINDEX,  TROPONINI in the last 168 hours. BNP (last 3 results) No results for input(s): PROBNP in the last 8760 hours. HbA1C: No results for input(s): HGBA1C in the last 72 hours. CBG:  Recent Labs Lab 03/24/16 2107  GLUCAP 131*   Lipid Profile: No results for input(s): CHOL, HDL, LDLCALC, TRIG, CHOLHDL, LDLDIRECT in the last 72 hours. Thyroid Function Tests: No results for input(s): TSH, T4TOTAL, FREET4, T3FREE, THYROIDAB in the last 72 hours. Anemia Panel: No results for input(s): VITAMINB12, FOLATE, FERRITIN, TIBC, IRON, RETICCTPCT in the last 72 hours. Urine analysis:    Component Value Date/Time   COLORURINE YELLOW 03/24/2016 0648   APPEARANCEUR CLEAR 03/24/2016 0648   LABSPEC 1.017 03/24/2016 0648   PHURINE 5.0 03/24/2016 0648   GLUCOSEU NEGATIVE 03/24/2016 0648   HGBUR NEGATIVE 03/24/2016 0648   BILIRUBINUR NEGATIVE 03/24/2016 0648   KETONESUR NEGATIVE 03/24/2016 0648   PROTEINUR NEGATIVE 03/24/2016 0648   NITRITE NEGATIVE 03/24/2016 0648   LEUKOCYTESUR NEGATIVE 03/24/2016 0648   Sepsis Labs: @LABRCNTIP (procalcitonin:4,lacticidven:4) ) Recent Results (from the past 240 hour(s))  Blood culture (routine x 2)     Status: Abnormal   Collection Time: 03/24/16  6:38 AM  Result Value Ref Range Status   Specimen Description BLOOD RIGHT HAND  Final   Special Requests BOTTLES DRAWN AEROBIC AND ANAEROBIC 5ML  Final   Culture  Setup Time   Final    GRAM POSITIVE COCCI IN CHAINS IN PAIRS ANAEROBIC BOTTLE ONLY CRITICAL RESULT CALLED TO, READ BACK BY AND VERIFIED WITH: BETH GREEN, PHARMD @0050  03/25/16 MKELLY,MLT Performed at [facility], [place] [address] [place], [place] [address]    Culture STREPTOCOCCUS GROUP G (A)  Final   Report Status 03/26/2016 FINAL  Final   Organism ID, Bacteria  STREPTOCOCCUS GROUP G  Final      Susceptibility   Streptococcus group g - MIC*    CLINDAMYCIN >=1 RESISTANT Resistant     AMPICILLIN <=0.25 SENSITIVE Sensitive     ERYTHROMYCIN >=8 RESISTANT Resistant     VANCOMYCIN 0.5 SENSITIVE Sensitive     CEFTRIAXONE <=0.12 SENSITIVE Sensitive     LEVOFLOXACIN 0.5 SENSITIVE Sensitive     * STREPTOCOCCUS GROUP G  Blood Culture ID Panel (Reflexed)     Status: Abnormal   Collection Time: 03/24/16  6:38 AM  Result Value Ref Range Status   Enterococcus species NOT DETECTED NOT DETECTED Final   Listeria monocytogenes NOT DETECTED NOT DETECTED Final   Staphylococcus species NOT DETECTED NOT DETECTED Final   Staphylococcus aureus NOT DETECTED NOT DETECTED Final   Streptococcus species DETECTED (A) NOT DETECTED Final    Comment: Not Enterococcus species, Streptococcus agalactiae, Streptococcus pyogenes, or Streptococcus pneumoniae. CRITICAL RESULT CALLED TO, READ BACK BY AND VERIFIED WITH: BETH GREEN, PHARMD @0050  03/25/16 MKELLY,MLT    Streptococcus agalactiae NOT DETECTED NOT DETECTED Final   Streptococcus pneumoniae NOT DETECTED NOT DETECTED Final   Streptococcus pyogenes NOT DETECTED NOT DETECTED Final   Acinetobacter baumannii NOT DETECTED NOT DETECTED Final   Enterobacteriaceae species NOT DETECTED NOT DETECTED Final   Enterobacter cloacae complex NOT DETECTED NOT DETECTED Final   Escherichia coli NOT DETECTED NOT DETECTED Final   Klebsiella oxytoca NOT DETECTED NOT DETECTED Final   Klebsiella pneumoniae NOT DETECTED NOT DETECTED Final   Proteus species NOT DETECTED NOT DETECTED Final   Serratia marcescens NOT DETECTED NOT DETECTED Final   Haemophilus influenzae NOT DETECTED NOT DETECTED Final   Neisseria meningitidis NOT DETECTED NOT DETECTED Final   Pseudomonas aeruginosa  NOT DETECTED NOT DETECTED Final   Candida albicans NOT DETECTED NOT DETECTED Final   Candida glabrata NOT DETECTED NOT DETECTED Final   Candida krusei NOT DETECTED NOT  DETECTED Final   Candida parapsilosis NOT DETECTED NOT DETECTED Final   Candida tropicalis NOT DETECTED NOT DETECTED Final    Comment: Performed at [facility], [place] [address], [place], [place] [address]  Urine culture     Status: Abnormal   Collection Time: 03/24/16  6:48 AM  Result Value Ref Range Status   Specimen Description URINE, CLEAN CATCH  Final   Special Requests NONE  Final   Culture MULTIPLE SPECIES PRESENT, SUGGEST RECOLLECTION (A)  Final   Report Status 03/25/2016 FINAL  Final  Blood culture (routine x 2)     Status: None (Preliminary result)   Collection Time: 03/24/16  6:59 AM  Result Value Ref Range Status   Specimen Description BLOOD LEFT FOREARM  Final   Special Requests BOTTLES DRAWN AEROBIC AND ANAEROBIC 5ML  Final   Culture   Final    NO GROWTH 1 DAY Performed at [facility], [place] [address], [place], [place] [address]    Report Status PENDING  Incomplete  Body fluid culture     Status: None (Preliminary result)   Collection Time: 03/24/16  7:30 AM  Result Value Ref Range Status   Specimen Description KNEE LEFT  Final   Special Requests NONE  Final   Gram Stain   Final    WBC PRESENT,BOTH PMN AND MONONUCLEAR NO ORGANISMS SEEN CYTOSPIN SMEAR Gram Stain Report Called to,Read Back By and Verified With: ROSSER,M. RN @0859  ON 2.5.18 BY [facility]    Culture   Final    RARE STREPTOCOCCUS GROUP G Results Called to: RN Lollie Marrow [id number] MLM Performed at [facility], [place] [address], [place], [place] [address]    Report Status PENDING  Incomplete  Surgical pcr screen     Status: Abnormal   Collection Time: 03/24/16  2:32 PM  Result Value Ref Range Status   MRSA, PCR POSITIVE (A) NEGATIVE Final    Comment: RESULT CALLED TO, READ BACK BY AND VERIFIED WITH: L.HUDSON RN AT 1950 ON 03/24/16 BY S.VANHOORNE    Staphylococcus aureus POSITIVE (A) NEGATIVE Final    Comment:        The Xpert SA Assay (FDA approved for NASAL specimens in patients  over 36 years of age), is one component of a comprehensive surveillance program.  Test performance has been validated by [facility] for patients greater than or equal to 65 year old. It is not intended to diagnose infection nor to guide or monitor treatment.   Aerobic/Anaerobic Culture (surgical/deep wound)     Status: None (Preliminary result)   Collection Time: 03/25/16  4:33 PM  Result Value Ref Range Status   Specimen Description WOUND LEFT KNEE  Final   Special Requests NONE  Final   Gram Stain   Final    RARE WBC PRESENT, PREDOMINANTLY PMN NO ORGANISMS SEEN Performed at [facility], [address], [place], [place] [address]    Culture PENDING  Incomplete   Report Status PENDING  Incomplete         Radiology Studies: No results found.    Scheduled Meds: . allopurinol  300 mg Oral Daily  . atorvastatin  20 mg Oral q1800  . cefTRIAXone (ROCEPHIN)  IV  2 g Intravenous Q24H  . Chlorhexidine Gluconate Cloth  6 each Topical Q0600  . diltiazem  420 mg Oral Daily  . enoxaparin (LOVENOX) injection  30 mg Subcutaneous Q24H  . ferrous sulfate  325 mg Oral TID PC  . gabapentin  200 mg Oral TID  . levothyroxine  50 mcg Oral QAC breakfast  . mupirocin ointment  1 application Nasal BID  . pantoprazole  40 mg Oral Daily  . senna  1 tablet Oral BID  . tamsulosin  0.4 mg Oral Daily   Continuous Infusions: . sodium chloride 100 mL/hr at 03/26/16 1008     LOS: 2 days    Time spent in minutes: 57    [place], MD [facility] Pager: [email or web address] Password [email or web address] 03/26/2016, 1:05 PM

## 2016-03-26 NOTE — Progress Notes (Signed)
Pharmacy Brief Note  FDA warning states to limit simvastatin to 10mg /day when used with diltiazem.  Patients on diltiazem and simvastatin >10 mg/day have reported cases of rhabdomyolysis.   Atorvastatin (Lipitor) 20mg  was substituted for simvastatin 40mg  per P&T policy.    Please consider at discharge.  Thanks, Pharmacy.  Gretta Arab PharmD, BCPS Pager [phone] 03/26/2016 11:07 AM

## 2016-03-27 DIAGNOSIS — N320 Bladder-neck obstruction: Secondary | ICD-10-CM

## 2016-03-27 LAB — COMPREHENSIVE METABOLIC PANEL
ALK PHOS: 38 U/L (ref 38–126)
ALT: 23 U/L (ref 17–63)
ANION GAP: 7 (ref 5–15)
AST: 29 U/L (ref 15–41)
Albumin: 2.5 g/dL — ABNORMAL LOW (ref 3.5–5.0)
BUN: 42 mg/dL — ABNORMAL HIGH (ref 6–20)
CO2: 21 mmol/L — AB (ref 22–32)
Calcium: 8.4 mg/dL — ABNORMAL LOW (ref 8.9–10.3)
Chloride: 113 mmol/L — ABNORMAL HIGH (ref 101–111)
Creatinine, Ser: 1.97 mg/dL — ABNORMAL HIGH (ref 0.61–1.24)
GFR calc non Af Amer: 31 mL/min — ABNORMAL LOW (ref >60)
GFR, EST AFRICAN AMERICAN: 37 mL/min — AB (ref >60)
Glucose, Bld: 124 mg/dL — ABNORMAL HIGH (ref 65–99)
POTASSIUM: 4.3 mmol/L (ref 3.5–5.1)
Sodium: 141 mmol/L (ref 135–145)
Total Bilirubin: 1.1 mg/dL (ref 0.3–1.2)
Total Protein: 5.2 g/dL — ABNORMAL LOW (ref 6.5–8.1)

## 2016-03-27 MED ORDER — ENOXAPARIN SODIUM 40 MG/0.4ML ~~LOC~~ SOLN
40.0000 mg | SUBCUTANEOUS | Status: DC
  Administered 2016-03-27: 40 mg via SUBCUTANEOUS
  Filled 2016-03-27: qty 0.4

## 2016-03-27 MED ORDER — GABAPENTIN 300 MG PO CAPS
300.0000 mg | ORAL_CAPSULE | Freq: Three times a day (TID) | ORAL | Status: DC
  Administered 2016-03-27 – 2016-03-28 (×4): 300 mg via ORAL
  Filled 2016-03-27 (×4): qty 1

## 2016-03-27 MED ORDER — POLYETHYLENE GLYCOL 3350 17 G PO PACK: 17.0000 g | PACK | Freq: Every day | ORAL | 0 refills | Status: DC | PRN

## 2016-03-27 MED ORDER — BISACODYL 10 MG RE SUPP: 10.0000 mg | Freq: Every day | RECTAL | 0 refills | Status: DC | PRN

## 2016-03-27 MED ORDER — FERROUS SULFATE 325 (65 FE) MG PO TABS: 325.0000 mg | ORAL_TABLET | Freq: Three times a day (TID) | ORAL | 3 refills | Status: DC

## 2016-03-27 MED ORDER — SENNA 8.6 MG PO TABS: 1.0000 | ORAL_TABLET | Freq: Two times a day (BID) | ORAL | 0 refills | Status: DC

## 2016-03-27 MED ORDER — ATORVASTATIN CALCIUM 20 MG PO TABS: 20.0000 mg | ORAL_TABLET | Freq: Every day | ORAL | 0 refills | Status: DC

## 2016-03-27 MED ORDER — BISACODYL 5 MG PO TBEC: 5.0000 mg | DELAYED_RELEASE_TABLET | Freq: Every day | ORAL | 0 refills | Status: DC | PRN

## 2016-03-27 MED ORDER — LEVOFLOXACIN 500 MG PO TABS: 500.0000 mg | ORAL_TABLET | Freq: Every day | ORAL | 0 refills | Status: DC

## 2016-03-27 NOTE — Discharge Summary (Signed)
Physician Discharge Summary  Zachary Mcdonald [id number] DOB: 01/15/1941 DOA: 03/24/2016  PCP: Tivis Ringer, MD  Admit date: 03/24/2016 Discharge date: 03/28/2016  Admitted From: home Disposition:  home   Recommendations for Outpatient Follow-up:  1. F/u Cr to be drawn by [facility] on Monday- adjust dose of Levaquin when Cr improves 2. F/u with ortho, Zachary Mcdonald as needed.  3. Urology f/u scheduled  Home Health:  ordered  Equipment/Devices:  walker    Discharge Condition:  stable   CODE STATUS:  Full code   Diet recommendation:  Heart healthy Consultations:  ortho  ID    Discharge Diagnoses:  Principal Problem:   Septic arthritis of knee, left (HCC) Active Problems:   Hypertension   Hypothyroidism   Streptococcal bacteremia   Acute renal insufficiency   Normocytic anemia   Thrombocytopenia (HCC)   Multilevel degenerative disc disease   GERD (gastroesophageal reflux disease)   Dyslipidemia   Bladder outflow obstruction    Subjective: No complaints of pain in knee. No GI symptoms from antibiotics.   Brief Summary: Zachary Mcdonald Mcdonald 76 y.o.male,With past medical history of arthritis, gout, GERD, hypertension, chronic lower back pain, he presents to ED secondary to left knee pain, chills, patient reports is been having worsening lower back pain over last few weeks recently seen by Zachary Mcdonald, given steroid taper. Presented with left knee pain where he fell due to his knee giving out and was unable to get up from the floor  ED workup significant for fever 102, tachycardia, leukocytosis, left synovial fluid arthrocentesis significant for leukocytosis of 102 K,  no crystals.  Hospital Course:  Principal Problem:   Septic arthritis of knee, left - ID assisting with management- s/p I and D on 2/6 by Zachary Mcdonald - growing strep Gp G in blood and from knee aspirate - Pt eval- HHPT ordered - on Rocephin- change to Levaquin per ID On d/c- will f/u with ID as outpt on  2/28.   Active Problems:      Acute renal insufficiency - determined to be due to BOO - Cr trended up from 1.2 to 3.77- bladder scan reveals > 900 cc of urine - RNs had significant difficult with placing foley or Coude cath-   Urology consulted - foley placed- f/u in 1 wk as out pt - cont Flomax which he was already taking - Cr 1.23 today    Normocytic anemia - stable    Thrombocytopenia  - possibly due to sepsis- follow    GERD (gastroesophageal reflux disease) - Protonix    Dyslipidemia - Lipitor    Hypertension -  Cardizem 420 mg daily - benicar/ HCTZ is on hold due to AKI- can resume tomorrow as Cr has normalized    Hypothyroidism - synthroid  Discharge Instructions  Discharge Instructions    Diet - low sodium heart healthy    Complete by:  As directed    Increase activity slowly    Complete by:  As directed      Allergies as of 03/28/2016      Reactions   Oxycontin [oxycodone Hcl] Nausea And Vomiting, Other (See Comments)   Dizziness   Valsartan-hydrochlorothiazide Diarrhea      Medication List    STOP taking these medications   oxyCODONE-acetaminophen 10-325 MG tablet Commonly known as:  PERCOCET   simvastatin 40 MG tablet Commonly known as:  ZOCOR     TAKE these medications   acetaminophen 500 MG tablet Commonly known as:  TYLENOL Take 1,000  mg by mouth every 6 (six) hours as needed.   allopurinol 300 MG tablet Commonly known as:  ZYLOPRIM Take 300 mg by mouth daily.   atorvastatin 20 MG tablet Commonly known as:  LIPITOR Take 1 tablet (20 mg total) by mouth daily at 6 PM.   BENICAR HCT 40-25 MG tablet Generic drug:  olmesartan-hydrochlorothiazide Take 1 tablet by mouth daily.   bisacodyl 10 MG suppository Commonly known as:  DULCOLAX Place 1 suppository (10 mg total) rectally daily as needed for moderate constipation.   bisacodyl 5 MG EC tablet Commonly known as:  DULCOLAX Take 1 tablet (5 mg total) by mouth daily as needed  for moderate constipation.   bismuth subsalicylate 99991111 MG chewable tablet Commonly known as:  PEPTO BISMOL Chew 1 tablet (262 mg total) by mouth 4 (four) times daily -  before meals and at bedtime. What changed:  when to take this  reasons to take this   diltiazem 420 MG 24 hr capsule Commonly known as:  TIAZAC Take 420 mg by mouth daily.   ferrous sulfate 325 (65 FE) MG tablet Take 1 tablet (325 mg total) by mouth 3 (three) times daily after meals.   fish oil-omega-3 fatty acids 1000 MG capsule Take 2 g by mouth daily.   gabapentin 300 MG capsule Commonly known as:  NEURONTIN Take 300 mg by mouth 3 (three) times daily.   levofloxacin 500 MG tablet Commonly known as:  LEVAQUIN Take 1 tablet (500 mg total) by mouth daily.   levothyroxine 50 MCG tablet Commonly known as:  SYNTHROID, LEVOTHROID Take 50 mcg by mouth daily before breakfast.   oxyCODONE 5 MG immediate release tablet Commonly known as:  Oxy IR/ROXICODONE Take 1-2 tablets (5-10 mg total) by mouth every 4 (four) hours as needed for severe pain.   polyethylene glycol packet Commonly known as:  MIRALAX / GLYCOLAX Take 17 g by mouth daily as needed for mild constipation.   senna 8.6 MG Tabs tablet Commonly known as:  SENOKOT Take 1 tablet (8.6 mg total) by mouth 2 (two) times daily.   tamsulosin 0.4 MG Caps capsule Commonly known as:  FLOMAX Take 0.4 mg by mouth daily.            Durable Medical Equipment        Start     Ordered   03/27/16 1512  For home use only DME Bedside commode  Once    Question:  Patient needs Mcdonald bedside commode to treat with the following condition  Answer:  Balance problem   03/27/16 1513   03/27/16 1510  For home use only DME Walker rolling  Once    Question:  Patient needs Mcdonald walker to treat with the following condition  Answer:  Balance problem   03/27/16 1511     Follow-up Information    Bre Fleck. Go on 04/02/2016.   Why:  at 8:00am, Arrive 15 minutes prior to  appointment, [employer], Bring Co-pay Contact information: [address] [place] [address] [phone]       Zachary A, MD Follow up in 1 week(s).   Specialty:  Orthopedic Surgery Contact information: [address] [place] [address] [phone]          Allergies  Allergen Reactions  . Oxycontin [Oxycodone Hcl] Nausea And Vomiting and Other (See Comments)    Dizziness   . Valsartan-Hydrochlorothiazide Diarrhea     Procedures/Studies: I and D of left knee 2/6  Mr Lumbar Spine W Wo Contrast  Result Date: 03/24/2016 CLINICAL DATA:  76 year old male with septic knee joint and suspected bacteremia. Fever and chills. Severe low back pain, maximal in the sacral/buttock area. Prior lumbar spine surgery. Initial encounter. EXAM: MRI LUMBAR SPINE WITHOUT AND WITH CONTRAST TECHNIQUE: Multiplanar and multiecho pulse sequences of the lumbar spine were obtained without and with intravenous contrast. CONTRAST:  75mL MULTIHANCE GADOBENATE DIMEGLUMINE 529 MG/ML IV SOLN COMPARISON:  CT lumbar myelogram 06/13/2013. FINDINGS: Segmentation: Normal as demonstrated on the comparison CT myelogram. There is Mcdonald vestigial S1-S2 disc space, but the S1 level is fully sacralized. Alignment: Mild dextroconvex lumbar scoliosis. Overall stable vertebral height and alignment since 2015. Vertebrae: Chronic degenerative endplate changes at 624THL and L5-S1. Trace if any associated marrow edema appears degenerative in nature. No abnormal enhancement identified. No evidence of lumbar osteomyelitis. Lower field of view was utilized in order to visualize the sacrum. Additional coronal STIR imaging of the sacrum was also obtained (series 7). Sacral bone marrow signal is normal and both SI joints are normal. Partially visible iliac wings and bilateral hip joints also appear normal. Conus medullaris: Extends to the L1-L2 level and appears normal. No abnormal intradural  enhancement. Paraspinal and other soft tissues: Mild distention of the urinary bladder. Mild ectasia of the seminal vesicles. Nonspecific perinephric stranding which can be age or renal insufficiency related. The lumbar posterior paraspinal muscles and soft tissues are normal. Posterior to the lower sacrum there is bilateral but slightly left worse than right increased STIR signal and enhancement in the medial gluteal musculature (series 7, image 12). No associated intramuscular abscess. The overlying subcutaneous soft tissues are normal except for lumbosacral junction level subcutaneous edema which is nonspecific but probably unrelated. Disc levels: Lumbar spine degeneration notable for the following. L2-L3: Severe chronic disc space loss with vacuum disc. Circumferential disc osteophyte complex with posterior element hypertrophy. Chronic but increased moderate to severe spinal and left lateral recess stenosis (series 5, image 12). Mild to moderate left L2 foraminal stenosis. L3-L4: Mild multifactorial spinal stenosis related to circumferential disc bulge and moderate posterior element hypertrophy. L5-S1: Chronic postoperative changes to the right lamina. Chronic severe disc space loss and vacuum disc. Circumferential disc osteophyte complex broad-based posterior component. Severe residual facet hypertrophy with trace facet joint fluid. No spinal stenosis following decompression. Architectural distortion at the right lateral recess. Up to mild left lateral recess stenosis. Moderate to severe bilateral L5 foraminal stenosis appears primarily related to disc and endplate spurring and unchanged since 2015. IMPRESSION: 1. Negative for lumbosacral discitis or osteomyelitis, and normal SI joints. 2. Acute myositis of the bilateral medial gluteal muscles which could be infectious or inflammatory. No associated intramuscular abscess. 3. Chronic lumbar spine degeneration and postoperative changes with mild progression since  2015. Moderate to severe spinal and left lateral recess stenosis at L2-L3. Electronically Signed   By: Genevie Ann M.D.   On: 03/24/2016 12:29   Dg Chest Port 1 View  Result Date: 03/24/2016 CLINICAL DATA:  Fever and chills, tachycardia, septic left knee joint. EXAM: PORTABLE CHEST 1 VIEW COMPARISON:  Portable chest x-ray of June 14, 2015 FINDINGS: The lungs are adequately inflated. There is no focal infiltrate. There is no pleural effusion. The heart is top-normal in size. The pulmonary vascularity is normal. There is calcification in the wall of the aortic arch. The trachea is midline. There is no pleural effusion. The bony thorax exhibits no acute abnormality. IMPRESSION: No acute cardiopulmonary abnormality. Thoracic aortic atherosclerosis. Electronically  Signed   By: David  Martinique M.D.   On: 03/24/2016 10:48   Dg Knee Complete 4 Views Left  Result Date: 03/24/2016 CLINICAL DATA:  Patient fell. Popliteal pain. Unable to bear weight. LEFT leg gave out. EXAM: LEFT KNEE - COMPLETE 4+ VIEW COMPARISON:  None. FINDINGS: No fracture is seen. There is Mcdonald moderate suprapatellar bursa effusion. Patellar spurs are noted. IMPRESSION: Moderate effusion.  No fracture. Electronically Signed   By: Staci Righter M.D.   On: 03/24/2016 06:37       Discharge Exam: Vitals:   03/27/16 2143 03/28/16 0546  BP: (!) 119/53 (!) 114/55  Pulse: 66 60  Resp: 20 18  Temp: (!) 100.9 F (38.3 C) 99.4 F (37.4 C)   Vitals:   03/27/16 0457 03/27/16 1300 03/27/16 2143 03/28/16 0546  BP: (!) 118/55 (!) 143/57 (!) 119/53 (!) 114/55  Pulse: 60 76 66 60  Resp: 18 16 20 18   Temp: 99 F (37.2 C) 97.9 F (36.6 C) (!) 100.9 F (38.3 C) 99.4 F (37.4 C)  TempSrc: Oral Axillary Oral Oral  SpO2: 94% 96% 93% 94%  Weight:      Height:        General: Pt is alert, awake, not in acute distress Cardiovascular: RRR, S1/S2 +, no rubs, no gallops Respiratory: CTA bilaterally, no wheezing, no rhonchi Abdominal: Soft, NT, ND, bowel  sounds + Extremities: no edema, no cyanosis- no significant tenderness in left knee    The results of significant diagnostics from this hospitalization (including imaging, microbiology, ancillary and laboratory) are listed below for reference.     Microbiology: Recent Results (from the past 240 hour(s))  Blood culture (routine x 2)     Status: Abnormal   Collection Time: 03/24/16  6:38 AM  Result Value Ref Range Status   Specimen Description BLOOD RIGHT HAND  Final   Special Requests BOTTLES DRAWN AEROBIC AND ANAEROBIC 5ML  Final   Culture  Setup Time   Final    GRAM POSITIVE COCCI IN CHAINS IN PAIRS ANAEROBIC BOTTLE ONLY CRITICAL RESULT CALLED TO, READ BACK BY AND VERIFIED WITH: BETH GREEN, PHARMD @0050  03/25/16 MKELLY,MLT Performed at [facility], [address], [place], [place] [address]    Culture STREPTOCOCCUS GROUP G (Mcdonald)  Final   Report Status 03/26/2016 FINAL  Final   Organism ID, Bacteria STREPTOCOCCUS GROUP G  Final      Susceptibility   Streptococcus group g - MIC*    CLINDAMYCIN >=1 RESISTANT Resistant     AMPICILLIN <=0.25 SENSITIVE Sensitive     ERYTHROMYCIN >=8 RESISTANT Resistant     VANCOMYCIN 0.5 SENSITIVE Sensitive     CEFTRIAXONE <=0.12 SENSITIVE Sensitive     LEVOFLOXACIN 0.5 SENSITIVE Sensitive     * STREPTOCOCCUS GROUP G  Blood Culture ID Panel (Reflexed)     Status: Abnormal   Collection Time: 03/24/16  6:38 AM  Result Value Ref Range Status   Enterococcus species NOT DETECTED NOT DETECTED Final   Listeria monocytogenes NOT DETECTED NOT DETECTED Final   Staphylococcus species NOT DETECTED NOT DETECTED Final   Staphylococcus aureus NOT DETECTED NOT DETECTED Final   Streptococcus species DETECTED (Mcdonald) NOT DETECTED Final    Comment: Not Enterococcus species, Streptococcus agalactiae, Streptococcus pyogenes, or Streptococcus pneumoniae. CRITICAL RESULT CALLED TO, READ BACK BY AND VERIFIED WITH: BETH GREEN, PHARMD @0050  03/25/16 MKELLY,MLT     Streptococcus agalactiae NOT DETECTED NOT DETECTED Final   Streptococcus pneumoniae NOT DETECTED NOT DETECTED Final  Streptococcus pyogenes NOT DETECTED NOT DETECTED Final   Acinetobacter baumannii NOT DETECTED NOT DETECTED Final   Enterobacteriaceae species NOT DETECTED NOT DETECTED Final   Enterobacter cloacae complex NOT DETECTED NOT DETECTED Final   Escherichia coli NOT DETECTED NOT DETECTED Final   Klebsiella oxytoca NOT DETECTED NOT DETECTED Final   Klebsiella pneumoniae NOT DETECTED NOT DETECTED Final   Proteus species NOT DETECTED NOT DETECTED Final   Serratia marcescens NOT DETECTED NOT DETECTED Final   Haemophilus influenzae NOT DETECTED NOT DETECTED Final   Neisseria meningitidis NOT DETECTED NOT DETECTED Final   Pseudomonas aeruginosa NOT DETECTED NOT DETECTED Final   Candida albicans NOT DETECTED NOT DETECTED Final   Candida glabrata NOT DETECTED NOT DETECTED Final   Candida krusei NOT DETECTED NOT DETECTED Final   Candida parapsilosis NOT DETECTED NOT DETECTED Final   Candida tropicalis NOT DETECTED NOT DETECTED Final    Comment: Performed at [facility], [place] [address] [place], [place] [address]  Urine culture     Status: Abnormal   Collection Time: 03/24/16  6:48 AM  Result Value Ref Range Status   Specimen Description URINE, CLEAN CATCH  Final   Special Requests NONE  Final   Culture MULTIPLE SPECIES PRESENT, SUGGEST RECOLLECTION (Mcdonald)  Final   Report Status 03/25/2016 FINAL  Final  Blood culture (routine x 2)     Status: None (Preliminary result)   Collection Time: 03/24/16  6:59 AM  Result Value Ref Range Status   Specimen Description BLOOD LEFT FOREARM  Final   Special Requests BOTTLES DRAWN AEROBIC AND ANAEROBIC 5ML  Final   Culture   Final    NO GROWTH 3 DAYS Performed at [facility], [place] [address] [place],  [address]    Report Status PENDING  Incomplete  Anaerobic culture     Status: None (Preliminary result)   Collection Time:  03/24/16  7:20 AM  Result Value Ref Range Status   Specimen Description KNEE LEFT  Final   Special Requests NONE  Final   Culture   Final    NO ANAEROBES ISOLATED; CULTURE IN PROGRESS FOR 5 DAYS   Report Status PENDING  Incomplete  Body fluid culture     Status: None   Collection Time: 03/24/16  7:30 AM  Result Value Ref Range Status   Specimen Description KNEE LEFT  Final   Special Requests NONE  Final   Gram Stain   Final    WBC PRESENT,BOTH PMN AND MONONUCLEAR NO ORGANISMS SEEN CYTOSPIN SMEAR Gram Stain Report Called to,Read Back By and Verified With: ROSSER,M. RN @0859  ON 2.5.18 BY [facility]    Culture   Final    RARE STREPTOCOCCUS GROUP G Results Called to: RN Lollie Marrow [id number] MLM Performed at [facility], [place] [address] [place],  [address]    Report Status 03/28/2016 FINAL  Final   Organism ID, Bacteria STREPTOCOCCUS GROUP G  Final      Susceptibility   Streptococcus group g - MIC*    CLINDAMYCIN >=1 RESISTANT Resistant     AMPICILLIN <=0.25 SENSITIVE Sensitive     ERYTHROMYCIN >=8 RESISTANT Resistant     VANCOMYCIN 0.5 SENSITIVE Sensitive     CEFTRIAXONE <=0.12 SENSITIVE Sensitive     LEVOFLOXACIN 0.5 SENSITIVE Sensitive     * RARE STREPTOCOCCUS GROUP G  Surgical pcr screen     Status: Abnormal   Collection Time: 03/24/16  2:32 PM  Result Value Ref Range Status  MRSA, PCR POSITIVE (Mcdonald) NEGATIVE Final    Comment: RESULT CALLED TO, READ BACK BY AND VERIFIED WITH: L.HUDSON RN AT 1950 ON 03/24/16 BY S.VANHOORNE    Staphylococcus aureus POSITIVE (Mcdonald) NEGATIVE Final    Comment:        The Xpert SA Assay (FDA approved for NASAL specimens in patients over 7 years of age), is one component of Mcdonald comprehensive surveillance program.  Test performance has been validated by [facility] for patients greater than or equal to 55 year old. It is not intended to diagnose infection nor to guide or monitor treatment.   Aerobic/Anaerobic Culture (surgical/deep  wound)     Status: None (Preliminary result)   Collection Time: 03/25/16  4:33 PM  Result Value Ref Range Status   Specimen Description WOUND LEFT KNEE  Final   Special Requests NONE  Final   Gram Stain   Final    RARE WBC PRESENT, PREDOMINANTLY PMN NO ORGANISMS SEEN    Culture   Final    NO GROWTH 3 DAYS NO ANAEROBES ISOLATED; CULTURE IN PROGRESS FOR 5 DAYS Performed at [facility], [address], [place], [place] [address]    Report Status PENDING  Incomplete     Labs: BNP (last 3 results) No results for input(s): BNP in the last 8760 hours. Basic Metabolic Panel:  Recent Labs Lab 03/25/16 0528 03/25/16 1900 03/25/16 1905 03/26/16 0553 03/27/16 0541 03/28/16 0533  NA 137 140  --  139 141 140  K 4.4 4.6  --  4.4 4.3 4.1  CL 109 114*  --  110 113* 112*  CO2 20* 17*  --  22 21* 22  GLUCOSE 154* 149*  --  120* 124* 146*  BUN 32* 40*  --  48* 42* 30*  CREATININE 2.59* 3.05* 3.11* 3.77* 1.97* 1.23  CALCIUM 8.2* 8.0*  --  8.3* 8.4* 8.5*   Liver Function Tests:  Recent Labs Lab 03/24/16 0731 03/25/16 0528 03/25/16 1900 03/27/16 0541 03/28/16 0533  AST 16 52* 47* 29 31  ALT 22 27 28 23  32  ALKPHOS 44 37* 40 38 54  BILITOT 1.0 1.6* 1.0 1.1 0.8  PROT 6.2* 5.3* 5.5* 5.2* 5.5*  ALBUMIN 3.8 3.0* 2.9* 2.5* 2.5*   No results for input(s): LIPASE, AMYLASE in the last 168 hours. No results for input(s): AMMONIA in the last 168 hours. CBC:  Recent Labs Lab 03/24/16 0638 03/24/16 1305 03/25/16 0528 03/25/16 1905 03/26/16 0553 03/26/16 1721  WBC 18.5* 20.5* 15.8* 12.1* 11.2* 9.3  NEUTROABS 15.2*  --  12.9*  --   --  6.7  HGB 14.1 13.2 12.5* 12.0* 11.6* 11.7*  HCT 39.8 36.5* 35.0* 34.7* 34.1* 33.9*  MCV 87.1 90.1 91.1 90.4 91.4 92.4  PLT 186 166 139* 135* 128* 122*   Cardiac Enzymes: No results for input(s): CKTOTAL, CKMB, CKMBINDEX, TROPONINI in the last 168 hours. BNP: Invalid input(s): POCBNP CBG:  Recent Labs Lab 03/24/16 2107  GLUCAP 131*    D-Dimer No results for input(s): DDIMER in the last 72 hours. Hgb A1c No results for input(s): HGBA1C in the last 72 hours. Lipid Profile No results for input(s): CHOL, HDL, LDLCALC, TRIG, CHOLHDL, LDLDIRECT in the last 72 hours. Thyroid function studies No results for input(s): TSH, T4TOTAL, T3FREE, THYROIDAB in the last 72 hours.  Invalid input(s): FREET3 Anemia work up No results for input(s): VITAMINB12, FOLATE, FERRITIN, TIBC, IRON, RETICCTPCT in the last 72 hours. Urinalysis    Component Value Date/Time  COLORURINE YELLOW 03/24/2016 [place] 03/24/2016 0648   LABSPEC 1.017 03/24/2016 0648   PHURINE 5.0 03/24/2016 0648   GLUCOSEU NEGATIVE 03/24/2016 0648   HGBUR NEGATIVE 03/24/2016 0648   BILIRUBINUR NEGATIVE 03/24/2016 [place] 03/24/2016 0648   PROTEINUR NEGATIVE 03/24/2016 0648   NITRITE NEGATIVE 03/24/2016 0648   LEUKOCYTESUR NEGATIVE 03/24/2016 0648   Sepsis Labs Invalid input(s): PROCALCITONIN,  WBC,  LACTICIDVEN Microbiology Recent Results (from the past 240 hour(s))  Blood culture (routine x 2)     Status: Abnormal   Collection Time: 03/24/16  6:38 AM  Result Value Ref Range Status   Specimen Description BLOOD RIGHT HAND  Final   Special Requests BOTTLES DRAWN AEROBIC AND ANAEROBIC 5ML  Final   Culture  Setup Time   Final    GRAM POSITIVE COCCI IN CHAINS IN PAIRS ANAEROBIC BOTTLE ONLY CRITICAL RESULT CALLED TO, READ BACK BY AND VERIFIED WITH: BETH GREEN, PHARMD @0050  03/25/16 MKELLY,MLT Performed at [facility], [place] [address], [place], [place] [address]    Culture STREPTOCOCCUS GROUP G (Mcdonald)  Final   Report Status 03/26/2016 FINAL  Final   Organism ID, Bacteria STREPTOCOCCUS GROUP G  Final      Susceptibility   Streptococcus group g - MIC*    CLINDAMYCIN >=1 RESISTANT Resistant     AMPICILLIN <=0.25 SENSITIVE Sensitive     ERYTHROMYCIN >=8 RESISTANT Resistant     VANCOMYCIN 0.5 SENSITIVE Sensitive      CEFTRIAXONE <=0.12 SENSITIVE Sensitive     LEVOFLOXACIN 0.5 SENSITIVE Sensitive     * STREPTOCOCCUS GROUP G  Blood Culture ID Panel (Reflexed)     Status: Abnormal   Collection Time: 03/24/16  6:38 AM  Result Value Ref Range Status   Enterococcus species NOT DETECTED NOT DETECTED Final   Listeria monocytogenes NOT DETECTED NOT DETECTED Final   Staphylococcus species NOT DETECTED NOT DETECTED Final   Staphylococcus aureus NOT DETECTED NOT DETECTED Final   Streptococcus species DETECTED (Mcdonald) NOT DETECTED Final    Comment: Not Enterococcus species, Streptococcus agalactiae, Streptococcus pyogenes, or Streptococcus pneumoniae. CRITICAL RESULT CALLED TO, READ BACK BY AND VERIFIED WITH: BETH GREEN, PHARMD @0050  03/25/16 MKELLY,MLT    Streptococcus agalactiae NOT DETECTED NOT DETECTED Final   Streptococcus pneumoniae NOT DETECTED NOT DETECTED Final   Streptococcus pyogenes NOT DETECTED NOT DETECTED Final   Acinetobacter baumannii NOT DETECTED NOT DETECTED Final   Enterobacteriaceae species NOT DETECTED NOT DETECTED Final   Enterobacter cloacae complex NOT DETECTED NOT DETECTED Final   Escherichia coli NOT DETECTED NOT DETECTED Final   Klebsiella oxytoca NOT DETECTED NOT DETECTED Final   Klebsiella pneumoniae NOT DETECTED NOT DETECTED Final   Proteus species NOT DETECTED NOT DETECTED Final   Serratia marcescens NOT DETECTED NOT DETECTED Final   Haemophilus influenzae NOT DETECTED NOT DETECTED Final   Neisseria meningitidis NOT DETECTED NOT DETECTED Final   Pseudomonas aeruginosa NOT DETECTED NOT DETECTED Final   Candida albicans NOT DETECTED NOT DETECTED Final   Candida glabrata NOT DETECTED NOT DETECTED Final   Candida krusei NOT DETECTED NOT DETECTED Final   Candida parapsilosis NOT DETECTED NOT DETECTED Final   Candida tropicalis NOT DETECTED NOT DETECTED Final    Comment: Performed at [facility], [place] [address], [place], [place] [address]  Urine culture     Status: Abnormal    Collection Time: 03/24/16  6:48 AM  Result Value Ref Range Status   Specimen Description URINE, CLEAN CATCH  Final   Special Requests NONE  Final   Culture MULTIPLE SPECIES PRESENT, SUGGEST RECOLLECTION (Mcdonald)  Final   Report Status 03/25/2016 FINAL  Final  Blood culture (routine x 2)     Status: None (Preliminary result)   Collection Time: 03/24/16  6:59 AM  Result Value Ref Range Status   Specimen Description BLOOD LEFT FOREARM  Final   Special Requests BOTTLES DRAWN AEROBIC AND ANAEROBIC 5ML  Final   Culture   Final    NO GROWTH 3 DAYS Performed at [facility], [place] [address] [place], [place] [address]    Report Status PENDING  Incomplete  Anaerobic culture     Status: None (Preliminary result)   Collection Time: 03/24/16  7:20 AM  Result Value Ref Range Status   Specimen Description KNEE LEFT  Final   Special Requests NONE  Final   Culture   Final    NO ANAEROBES ISOLATED; CULTURE IN PROGRESS FOR 5 DAYS   Report Status PENDING  Incomplete  Body fluid culture     Status: None   Collection Time: 03/24/16  7:30 AM  Result Value Ref Range Status   Specimen Description KNEE LEFT  Final   Special Requests NONE  Final   Gram Stain   Final    WBC PRESENT,BOTH PMN AND MONONUCLEAR NO ORGANISMS SEEN CYTOSPIN SMEAR Gram Stain Report Called to,Read Back By and Verified With: ROSSER,M. RN @0859  ON 2.5.18 BY [facility]    Culture   Final    RARE STREPTOCOCCUS GROUP G Results Called to: RN Lollie Marrow [id number] MLM Performed at [facility], [address] [place], [place] [address]    Report Status 03/28/2016 FINAL  Final   Organism ID, Bacteria STREPTOCOCCUS GROUP G  Final      Susceptibility   Streptococcus group g - MIC*    CLINDAMYCIN >=1 RESISTANT Resistant     AMPICILLIN <=0.25 SENSITIVE Sensitive     ERYTHROMYCIN >=8 RESISTANT Resistant     VANCOMYCIN 0.5 SENSITIVE Sensitive     CEFTRIAXONE <=0.12 SENSITIVE Sensitive     LEVOFLOXACIN 0.5 SENSITIVE Sensitive      * RARE STREPTOCOCCUS GROUP G  Surgical pcr screen     Status: Abnormal   Collection Time: 03/24/16  2:32 PM  Result Value Ref Range Status   MRSA, PCR POSITIVE ([facility]) NEGATIVE Final    Comment: RESULT CALLED TO, READ BACK BY AND VERIFIED WITH: L.HUDSON RN AT 1950 ON 03/24/16 BY S.VANHOORNE    Staphylococcus aureus POSITIVE ([facility]cdonald) NEGATIVE Final    Comment:        The Xpert SA Assay (FDA approved for NASAL specimens in patients over 63 years of age), is one component of [facility] comprehensive surveillance program.  Test performance has been validated by [facility] for patients greater than or equal to 8 year old. It is not intended to diagnose infection nor to guide or monitor treatment.   Aerobic/Anaerobic Culture (surgical/deep wound)     Status: None (Preliminary result)   Collection Time: 03/25/16  4:33 PM  Result Value Ref Range Status   Specimen Description WOUND LEFT KNEE  Final   Special Requests NONE  Final   Gram Stain   Final    RARE WBC PRESENT, PREDOMINANTLY PMN NO ORGANISMS SEEN    Culture   Final    NO GROWTH 3 DAYS NO ANAEROBES ISOLATED; CULTURE IN PROGRESS FOR 5 DAYS Performed at [facility], [address]  [place], [place] [address]    Report Status PENDING  Incomplete     Time coordinating discharge: Over 30 minutes  SIGNED:   Debbe Odea, MD  [facility] 03/28/2016, 11:46 AM Pager   If 7PM-7AM, please contact night-coverage [email or web address] Password [email or web address]

## 2016-03-27 NOTE — Evaluation (Signed)
Physical Therapy Evaluation Patient Details Name: Zachary Mcdonald MRN: [id number] DOB: Jul 02, 1940 Today's Date: 03/27/2016   History of Present Illness  76 y.o. male with past medical history of arthritis, gout, GERD, hypertension, chronic lower back pain, he presents to ED secondary to left knee pain, chills, patient reports is been having worsening lower back pain over last few weeks recently seen by Dr. Dellis Filbert, given steroid taper. Presented with left knee pain where he fell due to his knee giving out and was unable to get up from the floor and now s/p L knee arthroscopic irrigation debridement 03/25/16  Clinical Impression  Pt admitted with above diagnosis. Pt currently with functional limitations due to the deficits listed below (see PT Problem List).  Pt will benefit from skilled PT to increase their independence and safety with mobility to allow discharge to the venue listed below.  Pt requiring increased assist today however first day standing since admission.  Pt would benefit from more therapy tomorrow for safe d/c home.  Spouse requesting w/c for longer distances.     Follow Up Recommendations Home health PT;Supervision for mobility/OOB    Equipment Recommendations  Rolling walker with 5" wheels;Wheelchair cushion (measurements PT);3in1 (PT)    Recommendations for Other Services       Precautions / Restrictions Precautions Precautions: Fall Restrictions Other Position/Activity Restrictions: WBAT      Mobility  Bed Mobility Overal bed mobility: Needs Assistance Bed Mobility: Supine to Sit     Supine to sit: Min guard;HOB elevated     General bed mobility comments: pt self assist L LE over EOB  Transfers Overall transfer level: Needs assistance Equipment used: Rolling walker (2 wheeled) Transfers: Sit to/from Stand Sit to Stand: Mod assist         General transfer comment: verbal cues for UE and LE positioning and safe technique, assist to rise and  steady  Ambulation/Gait Ambulation/Gait assistance: Min assist Ambulation Distance (Feet): 10 Feet Assistive device: Rolling walker (2 wheeled) Gait Pattern/deviations: Step-to pattern;Decreased stance time - left;Antalgic;Trunk flexed     General Gait Details: verbal cues for sequence, RW positioning, posture, distance limited by pain and fatigue  Stairs            Wheelchair Mobility    Modified Rankin (Stroke Patients Only)       Balance                                             Pertinent Vitals/Pain Pain Assessment: 0-10 Pain Score: 7  Pain Location: L knee with weight bearing, better at rest Pain Descriptors / Indicators: Sore Pain Intervention(s): Limited activity within patient's tolerance;Monitored during session;Repositioned (declined pain meds)    Home Living Family/patient expects to be discharged to:: Private residence Living Arrangements: Spouse/significant other   Type of Home: House Home Access: Ramped entrance     Home Layout: One level Home Equipment: None;Shower seat - built in      Prior Function Level of Independence: Independent               Hand Dominance        Extremity/Trunk Assessment        Lower Extremity Assessment Lower Extremity Assessment: LLE deficits/detail LLE Deficits / Details: unable to perform SLR, functionally observed at least 70* active knee flexion upon sitting EOB       Communication  Communication: No difficulties  Cognition Arousal/Alertness: Awake/alert Behavior During Therapy: WFL for tasks assessed/performed Overall Cognitive Status: Within Functional Limits for tasks assessed                      General Comments      Exercises     Assessment/Plan    PT Assessment Patient needs continued PT services  PT Problem List Decreased strength;Decreased activity tolerance;Decreased knowledge of use of DME          PT Treatment Interventions DME  instruction;Gait training;Functional mobility training;Therapeutic exercise;Therapeutic activities;Patient/family education    PT Goals (Current goals can be found in the Care Plan section)  Acute Rehab PT Goals PT Goal Formulation: With patient Time For Goal Achievement: 04/03/16 Potential to Achieve Goals: Good    Frequency Min 5X/week   Barriers to discharge        Co-evaluation               End of Session Equipment Utilized During Treatment: Gait belt Activity Tolerance: Patient limited by fatigue Patient left: in chair;with call bell/phone within reach;with family/visitor present Nurse Communication: Mobility status         Time: [id number] PT Time Calculation (min) (ACUTE ONLY): 21 min   Charges:   PT Evaluation $PT Eval Moderate Complexity: 1 Procedure     PT G Codes:        [name],KATHrine E 03/27/2016, 12:31 PM Carmelia Bake, PT, DPT 03/27/2016 Pager: [phone]

## 2016-03-27 NOTE — Progress Notes (Signed)
Subjective: 2 Days Post-Op Procedure(s) (LRB): ARTHROSCOPIC WASHOUT OF LEFT KNEE (Left) Patient reports pain as 2 on 0-10 scale.Doing well today. Knee looks much better. Nephrologist will evaluate. Creatinine is now returning to normal. From our standpoint he should be able to be DC soon. Up with PT.WBC is normal and Temp. 99.   Objective: Vital signs in last 24 hours: Temp:  [98.8 F (37.1 C)-100.9 F (38.3 C)] 99 F (37.2 C) (02/08 0457) Pulse Rate:  [60-77] 60 (02/08 0457) Resp:  [16-18] 18 (02/08 0457) BP: (110-121)/(55-65) 118/55 (02/08 0457) SpO2:  [94 %-96 %] 94 % (02/08 0457)  Intake/Output from previous day: 02/07 0701 - 02/08 0700 In: 2610 [P.O.:960; I.V.:1600; IV Piggyback:50] Out: 3800 [Urine:3800] Intake/Output this shift: No intake/output data recorded.   Recent Labs  03/24/16 1305 03/25/16 0528 03/25/16 1905 03/26/16 0553 03/26/16 1721  HGB 13.2 12.5* 12.0* 11.6* 11.7*    Recent Labs  03/26/16 0553 03/26/16 1721  WBC 11.2* 9.3  RBC 3.73* 3.67*  HCT 34.1* 33.9*  PLT 128* 122*    Recent Labs  03/26/16 0553 03/27/16 0541  NA 139 141  K 4.4 4.3  CL 110 113*  CO2 22 21*  BUN 48* 42*  CREATININE 3.77* 1.97*  GLUCOSE 120* 124*  CALCIUM 8.3* 8.4*    Recent Labs  03/25/16 0528  INR 1.36    Neurologically intact No cellulitis present Compartment soft  Assessment/Plan: 2 Days Post-Op Procedure(s) (LRB): ARTHROSCOPIC WASHOUT OF LEFT KNEE (Left) Up with therapy  Jaidyn Kuhl A 03/27/2016, 7:38 AM

## 2016-03-27 NOTE — Consult Note (Signed)
Renal Service Consult Note [place] 03/27/2016 Sol Blazing Requesting Physician:  Dr Wynelle Cleveland  Reason for Consult:  Acute renal failure after knee surgery HPI: The patient is a 76 y.o. year-old with history of HTN, HL, gout who was admitted w fever and L knee pain/ swelling on 2/5.  Went to OR for washout and started on IV abx.  Creat was 1.11 on admission and has risen steadily since admission up to 3.77 yesterday.  Vanc IV was dc'd yesterday.  Yesterday afternoon the pt couldn't void and bladder scan showed 1000 cc urine, foley placed by urology and good UOP overnight. Today creat is down to 1.97.  UA on 2/5 was negative. Patietn has no c/o's today. Denies hist of renal failrue.      ROS  denies CP  no joint pain   no HA  no blurry vision  no rash  no diarrhea  no nausea/ vomiting  no dysuria  no difficulty voiding  no change in urine color    Past Medical History  Past Medical History:  Diagnosis Date  . Arthritis    spine and hands  . Borderline diabetes   . Colon polyps    Tubular Adenoma   . GERD (gastroesophageal reflux disease)   . Gout   . Helicobacter pylori gastritis   . Hyperlipidemia   . Hypertension   . Hyperthyroidism    Past Surgical History  Past Surgical History:  Procedure Laterality Date  . BACK SURGERY  2010  . BICEPS TENDON REPAIR Right 1999  . KNEE ARTHROSCOPY Left 03/25/2016   Procedure: ARTHROSCOPIC WASHOUT OF LEFT KNEE;  Surgeon: Latanya Maudlin, MD;  Location: [facility];  Service: Orthopedics;  Laterality: Left;  . MOLE REMOVAL     face  . VASECTOMY  1978   Family History  Family History  Problem Relation Age of Onset  . Diabetes Mother   . Colon cancer Brother     98's  . Diabetes Brother    Social History  reports that he has never smoked. He has never used smokeless tobacco. He reports that he does not drink alcohol or use drugs. Allergies  Allergies  Allergen Reactions  . Oxycontin  [Oxycodone Hcl] Nausea And Vomiting and Other (See Comments)    Dizziness   . Valsartan-Hydrochlorothiazide Diarrhea   Home medications Prior to Admission medications   Medication Sig Start Date End Date Taking? Authorizing Provider  acetaminophen (TYLENOL) 500 MG tablet Take 1,000 mg by mouth every 6 (six) hours as needed.   Yes Historical Provider, MD  allopurinol (ZYLOPRIM) 300 MG tablet Take 300 mg by mouth daily.   Yes Historical Provider, MD  BENICAR HCT 40-25 MG tablet Take 1 tablet by mouth daily.  06/16/15  Yes Historical Provider, MD  bismuth subsalicylate (PEPTO BISMOL) 262 MG chewable tablet Chew 1 tablet (262 mg total) by mouth 4 (four) times daily -  before meals and at bedtime. Patient taking differently: Chew 262 mg by mouth 3 (three) times daily as needed for indigestion.  12/05/15  Yes Jerene Bears, MD  diltiazem (TIAZAC) 420 MG 24 hr capsule Take 420 mg by mouth daily.   Yes Historical Provider, MD  fish oil-omega-3 fatty acids 1000 MG capsule Take 2 g by mouth daily.   Yes Historical Provider, MD  gabapentin (NEURONTIN) 300 MG capsule Take 300 mg by mouth 3 (three) times daily. 03/17/16  Yes Historical Provider, MD  levothyroxine (SYNTHROID, LEVOTHROID) 50 MCG tablet  Take 50 mcg by mouth daily before breakfast.   Yes Historical Provider, MD  oxyCODONE-acetaminophen (PERCOCET) 10-325 MG tablet Take 1 tablet by mouth every 4 (four) hours as needed for pain. 03/17/16  Yes Historical Provider, MD  simvastatin (ZOCOR) 40 MG tablet Take 40 mg by mouth every evening.   Yes Historical Provider, MD  tamsulosin (FLOMAX) 0.4 MG CAPS capsule Take 0.4 mg by mouth daily.  04/05/15  Yes Historical Provider, MD  metroNIDAZOLE (FLAGYL) 250 MG tablet Take 1 tablet (250 mg total) by mouth 4 (four) times daily. Patient not taking: Reported on 03/24/2016 12/05/15   Jerene Bears, MD   Liver Function Tests  Recent Labs Lab 03/25/16 0528 03/25/16 1900 03/27/16 0541  AST 52* 47* 29  ALT 27 28 23    ALKPHOS 37* 40 38  BILITOT 1.6* 1.0 1.1  PROT 5.3* 5.5* 5.2*  ALBUMIN 3.0* 2.9* 2.5*   No results for input(s): LIPASE, AMYLASE in the last 168 hours. CBC  Recent Labs Lab 03/24/16 0638  03/25/16 0528 03/25/16 1905 03/26/16 0553 03/26/16 1721  WBC 18.5*  < > 15.8* 12.1* 11.2* 9.3  NEUTROABS 15.2*  --  12.9*  --   --  6.7  HGB 14.1  < > 12.5* 12.0* 11.6* 11.7*  HCT 39.8  < > 35.0* 34.7* 34.1* 33.9*  MCV 87.1  < > 91.1 90.4 91.4 92.4  PLT 186  < > 139* 135* 128* 122*  < > = values in this interval not displayed. Basic Metabolic Panel  Recent Labs Lab 03/24/16 0731 03/24/16 1305 03/25/16 0528 03/25/16 1900 03/25/16 1905 03/26/16 0553 03/27/16 0541  NA 135  --  137 140  --  139 141  K 3.1*  --  4.4 4.6  --  4.4 4.3  CL 100*  --  109 114*  --  110 113*  CO2 28  --  20* 17*  --  22 21*  GLUCOSE 163*  --  154* 149*  --  120* 124*  BUN 31*  --  32* 40*  --  48* 42*  CREATININE 1.25* 1.11 2.59* 3.05* 3.11* 3.77* 1.97*  CALCIUM 8.8*  --  8.2* 8.0*  --  8.3* 8.4*   Iron/TIBC/Ferritin/ %Sat No results found for: IRON, TIBC, FERRITIN, IRONPCTSAT  Vitals:   03/26/16 1330 03/26/16 2109 03/27/16 0029 03/27/16 0457  BP: 121/65 (!) 110/55  (!) 118/55  Pulse: 77 75  60  Resp: 18 16  18   Temp: 98.8 F (37.1 C) (!) 100.9 F (38.3 C) 99.7 F (37.6 C) 99 F (37.2 C)  TempSrc: Oral Oral Oral Oral  SpO2: 96% 94%  94%  Weight:      Height:       Exam Gen alert, no distress No rash, cyanosis or gangrene Sclera anicteric, throat clear  No jvd or bruits Chest clear bilat RRR no MRG Abd soft ntnd no mass or ascites +bs GU normal male, foley in place MS no joint effusions or deformity Ext no LE edema / no wounds or ulcers Neuro is alert, Ox 3 , nf   Assessment: 1. Acute renal failure - due to bladder outlet obstruction. Hx + for prostatism symptoms (slow stream, etc) , prob has BPH.  Urology will f/u in OP setting.  Foley in now. Would continue IVF"s for today.  Doubt  vanc toxicity.  Will sign off. Have d/w surgery.  2. Septic L knee, sp washout - cx's + strep group G   Plan - as  above   Kelly Splinter MD [employer] pager [phone]   03/27/2016, 10:53 AM

## 2016-03-27 NOTE — Progress Notes (Signed)
Spoke with pt concerning HHPT. Pt selected [facility].  Referral given to in house rep.

## 2016-03-27 NOTE — Progress Notes (Signed)
PROGRESS NOTE    Zachary Mcdonald   [id number]  DOB: 1940/12/02  DOA: 03/24/2016 PCP: Tivis Ringer, MD   Brief Narrative:  Zachary Mcdonald a 76 y.o.male,With past medical history of arthritis, gout, GERD, hypertension, chronic lower back pain, he presents to ED secondary to left knee pain, chills, patient reports is been having worsening lower back pain over last few weeks recently seen by Dr. Dellis Filbert, given steroid taper. Presented with left knee pain where he fell due to his knee giving out and was unable to get up from the floor  ED workup significant for fever 102, tachycardia, leukocytosis, left synovial fluid arthrocentesis significant for leukocytosis of 102 K,  no crystals.  Subjective:  no pain in knee- no nausea, vomiting diarrhea. No abdominal discomfort despite having > 900 cc of urine in bladder  Assessment & Plan:   Principal Problem:   Septic arthritis of knee, left - growing strep Gp G in blood and from knee aspirate - ID assisting with management- s/p I and D on 2/6 by Dr Gladstone Lighter - Pt eval- HHPT - on Rocephin- change to Levaquin per ID On d/c  Active Problems:      Acute renal insufficiency - determined to be due to BOO - Cr trended up from 1.2 to 3.77- bladder scan reveals > 900 cc of urine - RNs having significant difficult with placing foley or Coude cath- have called Urology - foley placed- f/u in 1 wk as out pt - Cr improving - cont Flomax    Normocytic anemia - stable    Thrombocytopenia  - possibly due to sepsis- follow    GERD (gastroesophageal reflux disease) - Protonix    Dyslipidemia - Lipitor    Hypertension -  Cardizem 420 mg daily - benicar/ HCTZ is on hold    Hypothyroidism - synthroid  DVT prophylaxis: Lovenox Code Status: Full code Family Communication:  Disposition Plan: home Consultants:   Ortho  ID  Urology Procedures:   I and D of left knee 2/6 Antimicrobials:  Anti-infectives    Start      Dose/Rate Route Frequency Ordered Stop   03/27/16 0000  levofloxacin (LEVAQUIN) 500 MG tablet    Comments:  Please call Dr Michel Bickers, ID, for refills in 1 wk. Thanks.   500 mg Oral Daily 03/27/16 1144     03/26/16 1200  cefTRIAXone (ROCEPHIN) 2 g in dextrose 5 % 50 mL IVPB     2 g 100 mL/hr over 30 Minutes Intravenous Every 24 hours 03/26/16 1111     03/25/16 2200  clarithromycin (BIAXIN) tablet 500 mg  Status:  Discontinued     500 mg Oral 2 times daily 03/25/16 1843 03/25/16 1922   03/25/16 1930  vancomycin (VANCOCIN) IVPB 1000 mg/200 mL premix  Status:  Discontinued     1,000 mg 200 mL/hr over 60 Minutes Intravenous Every 12 hours 03/25/16 1843 03/25/16 1913   03/25/16 1845  metroNIDAZOLE (FLAGYL) tablet 250 mg  Status:  Discontinued     250 mg Oral 4 times daily 03/25/16 1843 03/25/16 1923   03/25/16 0600  ceFAZolin (ANCEF) IVPB 2g/100 mL premix  Status:  Discontinued     2 g 200 mL/hr over 30 Minutes Intravenous On call to O.R. 03/24/16 1340 03/25/16 1828   03/24/16 2200  piperacillin-tazobactam (ZOSYN) IVPB 3.375 g  Status:  Discontinued     3.375 g 12.5 mL/hr over 240 Minutes Intravenous Every 8 hours 03/24/16 1115 03/26/16 1111   03/24/16 1600  vancomycin (  VANCOCIN) 1,250 mg in sodium chloride 0.9 % 250 mL IVPB  Status:  Discontinued     1,250 mg 166.7 mL/hr over 90 Minutes Intravenous Every 12 hours 03/24/16 1118 03/25/16 0823   03/24/16 1130  piperacillin-tazobactam (ZOSYN) IVPB 3.375 g     3.375 g 100 mL/hr over 30 Minutes Intravenous  Once 03/24/16 1115 03/24/16 1448   03/24/16 0730  ceFAZolin (ANCEF) IVPB 1 g/50 mL premix     1 g 100 mL/hr over 30 Minutes Intravenous  Once 03/24/16 0727 03/24/16 0837   03/24/16 0730  vancomycin (VANCOCIN) IVPB 1000 mg/200 mL premix     1,000 mg 200 mL/hr over 60 Minutes Intravenous  Once 03/24/16 0727 03/24/16 0957       Objective: Vitals:   03/26/16 2109 03/27/16 0029 03/27/16 0457 03/27/16 1300  BP: (!) 110/55  (!) 118/55 (!)  143/57  Pulse: 75  60 76  Resp: 16  18 16   Temp: (!) 100.9 F (38.3 C) 99.7 F (37.6 C) 99 F (37.2 C) 97.9 F (36.6 C)  TempSrc: Oral Oral Oral Axillary  SpO2: 94%  94% 96%  Weight:      Height:        Intake/Output Summary (Last 24 hours) at 03/27/16 1424 Last data filed at 03/27/16 1355  Gross per 24 hour  Intake             2200 ml  Output             4050 ml  Net            -1850 ml   Filed Weights   03/24/16 1040  Weight: 95.3 kg (210 lb)    Examination: General exam: Appears comfortable  HEENT: PERRLA, oral mucosa moist, no sclera icterus or thrush Respiratory system: Clear to auscultation. Respiratory effort normal. Cardiovascular system: S1 & S2 heard, RRR.  No murmurs  Gastrointestinal system: Abdomen soft, non-tender, nondistended. Normal bowel sound. No organomegaly Central nervous system: Alert and oriented. No focal neurological deficits. Extremities: No cyanosis, clubbing or edema- left knee in dressing Skin: No rashes or ulcers Psychiatry:  Mood & affect appropriate.     Data Reviewed: I have personally reviewed following labs and imaging studies  CBC:  Recent Labs Lab 03/24/16 0638 03/24/16 1305 03/25/16 0528 03/25/16 1905 03/26/16 0553 03/26/16 1721  WBC 18.5* 20.5* 15.8* 12.1* 11.2* 9.3  NEUTROABS 15.2*  --  12.9*  --   --  6.7  HGB 14.1 13.2 12.5* 12.0* 11.6* 11.7*  HCT 39.8 36.5* 35.0* 34.7* 34.1* 33.9*  MCV 87.1 90.1 91.1 90.4 91.4 92.4  PLT 186 166 139* 135* 128* 123XX123*   Basic Metabolic Panel:  Recent Labs Lab 03/24/16 0731  03/25/16 0528 03/25/16 1900 03/25/16 1905 03/26/16 0553 03/27/16 0541  NA 135  --  137 140  --  139 141  K 3.1*  --  4.4 4.6  --  4.4 4.3  CL 100*  --  109 114*  --  110 113*  CO2 28  --  20* 17*  --  22 21*  GLUCOSE 163*  --  154* 149*  --  120* 124*  BUN 31*  --  32* 40*  --  48* 42*  CREATININE 1.25*  < > 2.59* 3.05* 3.11* 3.77* 1.97*  CALCIUM 8.8*  --  8.2* 8.0*  --  8.3* 8.4*  < > = values in  this interval not displayed. GFR: Estimated Creatinine Clearance: 38.8 mL/min (by C-G formula based  on SCr of 1.97 mg/dL (H)). Liver Function Tests:  Recent Labs Lab 03/24/16 0731 03/25/16 0528 03/25/16 1900 03/27/16 0541  AST 16 52* 47* 29  ALT 22 27 28 23   ALKPHOS 44 37* 40 38  BILITOT 1.0 1.6* 1.0 1.1  PROT 6.2* 5.3* 5.5* 5.2*  ALBUMIN 3.8 3.0* 2.9* 2.5*   No results for input(s): LIPASE, AMYLASE in the last 168 hours. No results for input(s): AMMONIA in the last 168 hours. Coagulation Profile:  Recent Labs Lab 03/25/16 0528  INR 1.36   Cardiac Enzymes: No results for input(s): CKTOTAL, CKMB, CKMBINDEX, TROPONINI in the last 168 hours. BNP (last 3 results) No results for input(s): PROBNP in the last 8760 hours. HbA1C: No results for input(s): HGBA1C in the last 72 hours. CBG:  Recent Labs Lab 03/24/16 2107  GLUCAP 131*   Lipid Profile: No results for input(s): CHOL, HDL, LDLCALC, TRIG, CHOLHDL, LDLDIRECT in the last 72 hours. Thyroid Function Tests: No results for input(s): TSH, T4TOTAL, FREET4, T3FREE, THYROIDAB in the last 72 hours. Anemia Panel: No results for input(s): VITAMINB12, FOLATE, FERRITIN, TIBC, IRON, RETICCTPCT in the last 72 hours. Urine analysis:    Component Value Date/Time   COLORURINE YELLOW 03/24/2016 0648   APPEARANCEUR CLEAR 03/24/2016 0648   LABSPEC 1.017 03/24/2016 0648   PHURINE 5.0 03/24/2016 0648   GLUCOSEU NEGATIVE 03/24/2016 0648   HGBUR NEGATIVE 03/24/2016 0648   BILIRUBINUR NEGATIVE 03/24/2016 0648   KETONESUR NEGATIVE 03/24/2016 0648   PROTEINUR NEGATIVE 03/24/2016 0648   NITRITE NEGATIVE 03/24/2016 0648   LEUKOCYTESUR NEGATIVE 03/24/2016 0648   Sepsis Labs: @LABRCNTIP (procalcitonin:4,lacticidven:4) ) Recent Results (from the past 240 hour(s))  Blood culture (routine x 2)     Status: Abnormal   Collection Time: 03/24/16  6:38 AM  Result Value Ref Range Status   Specimen Description BLOOD RIGHT HAND  Final    Special Requests BOTTLES DRAWN AEROBIC AND ANAEROBIC 5ML  Final   Culture  Setup Time   Final    GRAM POSITIVE COCCI IN CHAINS IN PAIRS ANAEROBIC BOTTLE ONLY CRITICAL RESULT CALLED TO, READ BACK BY AND VERIFIED WITH: BETH GREEN, PHARMD @0050  03/25/16 MKELLY,MLT Performed at [facility], [place] [address], [place], [place] [address]    Culture STREPTOCOCCUS GROUP G (A)  Final   Report Status 03/26/2016 FINAL  Final   Organism ID, Bacteria STREPTOCOCCUS GROUP G  Final      Susceptibility   Streptococcus group g - MIC*    CLINDAMYCIN >=1 RESISTANT Resistant     AMPICILLIN <=0.25 SENSITIVE Sensitive     ERYTHROMYCIN >=8 RESISTANT Resistant     VANCOMYCIN 0.5 SENSITIVE Sensitive     CEFTRIAXONE <=0.12 SENSITIVE Sensitive     LEVOFLOXACIN 0.5 SENSITIVE Sensitive     * STREPTOCOCCUS GROUP G  Blood Culture ID Panel (Reflexed)     Status: Abnormal   Collection Time: 03/24/16  6:38 AM  Result Value Ref Range Status   Enterococcus species NOT DETECTED NOT DETECTED Final   Listeria monocytogenes NOT DETECTED NOT DETECTED Final   Staphylococcus species NOT DETECTED NOT DETECTED Final   Staphylococcus aureus NOT DETECTED NOT DETECTED Final   Streptococcus species DETECTED (A) NOT DETECTED Final    Comment: Not Enterococcus species, Streptococcus agalactiae, Streptococcus pyogenes, or Streptococcus pneumoniae. CRITICAL RESULT CALLED TO, READ BACK BY AND VERIFIED WITH: BETH GREEN, PHARMD @0050  03/25/16 MKELLY,MLT    Streptococcus agalactiae NOT DETECTED NOT DETECTED Final   Streptococcus pneumoniae NOT DETECTED NOT DETECTED Final  Streptococcus pyogenes NOT DETECTED NOT DETECTED Final   Acinetobacter baumannii NOT DETECTED NOT DETECTED Final   Enterobacteriaceae species NOT DETECTED NOT DETECTED Final   Enterobacter cloacae complex NOT DETECTED NOT DETECTED Final   Escherichia coli NOT DETECTED NOT DETECTED Final   Klebsiella oxytoca NOT DETECTED NOT DETECTED Final   Klebsiella pneumoniae  NOT DETECTED NOT DETECTED Final   Proteus species NOT DETECTED NOT DETECTED Final   Serratia marcescens NOT DETECTED NOT DETECTED Final   Haemophilus influenzae NOT DETECTED NOT DETECTED Final   Neisseria meningitidis NOT DETECTED NOT DETECTED Final   Pseudomonas aeruginosa NOT DETECTED NOT DETECTED Final   Candida albicans NOT DETECTED NOT DETECTED Final   Candida glabrata NOT DETECTED NOT DETECTED Final   Candida krusei NOT DETECTED NOT DETECTED Final   Candida parapsilosis NOT DETECTED NOT DETECTED Final   Candida tropicalis NOT DETECTED NOT DETECTED Final    Comment: Performed at [facility], [place] [address], [place], [place] [address]  Urine culture     Status: Abnormal   Collection Time: 03/24/16  6:48 AM  Result Value Ref Range Status   Specimen Description URINE, CLEAN CATCH  Final   Special Requests NONE  Final   Culture MULTIPLE SPECIES PRESENT, SUGGEST RECOLLECTION (A)  Final   Report Status 03/25/2016 FINAL  Final  Blood culture (routine x 2)     Status: None (Preliminary result)   Collection Time: 03/24/16  6:59 AM  Result Value Ref Range Status   Specimen Description BLOOD LEFT FOREARM  Final   Special Requests BOTTLES DRAWN AEROBIC AND ANAEROBIC 5ML  Final   Culture   Final    NO GROWTH 3 DAYS Performed at [facility], [place] [address] [place], [place] [address]    Report Status PENDING  Incomplete  Anaerobic culture     Status: None (Preliminary result)   Collection Time: 03/24/16  7:20 AM  Result Value Ref Range Status   Specimen Description KNEE LEFT  Final   Special Requests NONE  Final   Culture   Final    NO ANAEROBES ISOLATED; CULTURE IN PROGRESS FOR 5 DAYS   Report Status PENDING  Incomplete  Body fluid culture     Status: None (Preliminary result)   Collection Time: 03/24/16  7:30 AM  Result Value Ref Range Status   Specimen Description KNEE LEFT  Final   Special Requests NONE  Final   Gram Stain   Final    WBC PRESENT,BOTH PMN AND  MONONUCLEAR NO ORGANISMS SEEN CYTOSPIN SMEAR Gram Stain Report Called to,Read Back By and Verified With: ROSSER,M. RN @0859  ON 2.5.18 BY [facility]    Culture   Final    RARE STREPTOCOCCUS GROUP G Results Called to: RN Lollie Marrow [id number] MLM SUSCEPTIBILITIES TO FOLLOW Performed at [facility], [place] [address] [place], [place] [address]    Report Status PENDING  Incomplete  Surgical pcr screen     Status: Abnormal   Collection Time: 03/24/16  2:32 PM  Result Value Ref Range Status   MRSA, PCR POSITIVE (A) NEGATIVE Final    Comment: RESULT CALLED TO, READ BACK BY AND VERIFIED WITH: L.HUDSON RN AT 1950 ON 03/24/16 BY S.VANHOORNE    Staphylococcus aureus POSITIVE (A) NEGATIVE Final    Comment:        The Xpert SA Assay (FDA approved for NASAL specimens in patients over 7 years of age), is one component of a comprehensive surveillance program.  Test performance has been validated by [facility] for patients greater than or equal to 46 year old. It is not intended to diagnose infection nor to guide or monitor treatment.   Aerobic/Anaerobic Culture (surgical/deep wound)     Status: None (Preliminary result)   Collection Time: 03/25/16  4:33 PM  Result Value Ref Range Status   Specimen Description WOUND LEFT KNEE  Final   Special Requests NONE  Final   Gram Stain   Final    RARE WBC PRESENT, PREDOMINANTLY PMN NO ORGANISMS SEEN    Culture   Final    NO GROWTH 2 DAYS NO ANAEROBES ISOLATED; CULTURE IN PROGRESS FOR 5 DAYS Performed at [facility], [place] [address] [place], [place] [address]    Report Status PENDING  Incomplete         Radiology Studies: No results found.    Scheduled Meds: . allopurinol  300 mg Oral Daily  . atorvastatin  20 mg Oral q1800  . cefTRIAXone (ROCEPHIN)  IV  2 g Intravenous Q24H  . Chlorhexidine Gluconate Cloth  6 each Topical Q0600  . diltiazem  420 mg Oral Daily  . enoxaparin (LOVENOX) injection  40 mg Subcutaneous Q24H  .  ferrous sulfate  325 mg Oral TID PC  . gabapentin  300 mg Oral TID  . levothyroxine  50 mcg Oral QAC breakfast  . mupirocin ointment  1 application Nasal BID  . pantoprazole  40 mg Oral Daily  . senna  1 tablet Oral BID  . tamsulosin  0.4 mg Oral QPC breakfast   Continuous Infusions:    LOS: 3 days    Time spent in minutes: 52    [place], MD [facility] Pager: [email or web address] Password [facility] 03/27/2016, 2:24 PM

## 2016-03-28 DIAGNOSIS — N320 Bladder-neck obstruction: Secondary | ICD-10-CM

## 2016-03-28 DIAGNOSIS — Z960 Presence of urogenital implants: Secondary | ICD-10-CM

## 2016-03-28 DIAGNOSIS — M009 Pyogenic arthritis, unspecified: Secondary | ICD-10-CM

## 2016-03-28 DIAGNOSIS — R338 Other retention of urine: Secondary | ICD-10-CM

## 2016-03-28 LAB — BODY FLUID CULTURE

## 2016-03-28 LAB — COMPREHENSIVE METABOLIC PANEL
ALT: 32 U/L (ref 17–63)
ANION GAP: 6 (ref 5–15)
AST: 31 U/L (ref 15–41)
Albumin: 2.5 g/dL — ABNORMAL LOW (ref 3.5–5.0)
Alkaline Phosphatase: 54 U/L (ref 38–126)
BUN: 30 mg/dL — ABNORMAL HIGH (ref 6–20)
CO2: 22 mmol/L (ref 22–32)
Calcium: 8.5 mg/dL — ABNORMAL LOW (ref 8.9–10.3)
Chloride: 112 mmol/L — ABNORMAL HIGH (ref 101–111)
Creatinine, Ser: 1.23 mg/dL (ref 0.61–1.24)
GFR calc non Af Amer: 56 mL/min — ABNORMAL LOW (ref >60)
Glucose, Bld: 146 mg/dL — ABNORMAL HIGH (ref 65–99)
POTASSIUM: 4.1 mmol/L (ref 3.5–5.1)
SODIUM: 140 mmol/L (ref 135–145)
Total Bilirubin: 0.8 mg/dL (ref 0.3–1.2)
Total Protein: 5.5 g/dL — ABNORMAL LOW (ref 6.5–8.1)

## 2016-03-28 MED ORDER — SENNA 8.6 MG PO TABS: 1.0000 | ORAL_TABLET | Freq: Two times a day (BID) | ORAL | 0 refills | Status: DC

## 2016-03-28 MED ORDER — OXYCODONE HCL 5 MG PO TABS: 5.0000 mg | ORAL_TABLET | ORAL | 0 refills | Status: DC | PRN

## 2016-03-28 MED ORDER — POLYETHYLENE GLYCOL 3350 17 G PO PACK: 17.0000 g | PACK | Freq: Every day | ORAL | 0 refills | Status: DC | PRN

## 2016-03-28 MED ORDER — ATORVASTATIN CALCIUM 20 MG PO TABS: 20.0000 mg | ORAL_TABLET | Freq: Every day | ORAL | 0 refills | Status: DC

## 2016-03-28 MED ORDER — BISACODYL 5 MG PO TBEC: 5.0000 mg | DELAYED_RELEASE_TABLET | Freq: Every day | ORAL | 0 refills | Status: DC | PRN

## 2016-03-28 MED ORDER — FERROUS SULFATE 325 (65 FE) MG PO TABS: 325.0000 mg | ORAL_TABLET | Freq: Three times a day (TID) | ORAL | 3 refills | Status: DC

## 2016-03-28 NOTE — Care Management Important Message (Addendum)
Important Message  Patient Details IM Letter given to Cookie/Case Manager to present to Patient Name: Zachary Mcdonald MRN: [id number] Date of Birth: April 10, 1940   Medicare Important Message Given:  Yes    Kerin Salen 03/28/2016, 12:43 [place] Message  Patient Details  Name: Zachary Mcdonald MRN: [id number] Date of Birth: 06-22-1940   Medicare Important Message Given:  Yes    Kerin Salen 03/28/2016, 12:43 PM

## 2016-03-28 NOTE — Progress Notes (Signed)
   Subjective: 3 Days Post-Op Procedure(s) (LRB): ARTHROSCOPIC WASHOUT OF LEFT KNEE (Left) Patient reports pain as mild.   Patient seen in rounds for Dr. Gladstone Lighter. Patient is well, and has had no acute complaints or problems in regards to the knee. He reports that it is "feeling much better". Denies SOB and chest pain.    Objective: Vital signs in last 24 hours: Temp:  [97.9 F (36.6 C)-100.9 F (38.3 C)] 99.4 F (37.4 C) (02/09 0546) Pulse Rate:  [60-76] 60 (02/09 0546) Resp:  [16-20] 18 (02/09 0546) BP: (114-143)/(53-57) 114/55 (02/09 0546) SpO2:  [93 %-96 %] 94 % (02/09 0546)  Intake/Output from previous day:  Intake/Output Summary (Last 24 hours) at 03/28/16 0733 Last data filed at 03/28/16 0600  Gross per 24 hour  Intake              170 ml  Output             1500 ml  Net            -1330 ml    Intake/Output this shift: No intake/output data recorded.  Labs:  Recent Labs  03/25/16 1905 03/26/16 0553 03/26/16 1721  HGB 12.0* 11.6* 11.7*    Recent Labs  03/26/16 0553 03/26/16 1721  WBC 11.2* 9.3  RBC 3.73* 3.67*  HCT 34.1* 33.9*  PLT 128* 122*    Recent Labs  03/27/16 0541 03/28/16 0533  NA 141 140  K 4.3 4.1  CL 113* 112*  CO2 21* 22  BUN 42* 30*  CREATININE 1.97* 1.23  GLUCOSE 124* 146*  CALCIUM 8.4* 8.5*    EXAM General - Patient is Alert and Oriented Extremity - Neurologically intact Intact pulses distally Dorsiflexion/Plantar flexion intact No cellulitis present Compartment soft Dressing/Incision - clean, dry, no drainage Motor Function - intact, moving foot and toes well on exam.   Past Medical History:  Diagnosis Date  . Arthritis    spine and hands  . Borderline diabetes   . Colon polyps    Tubular Adenoma   . GERD (gastroesophageal reflux disease)   . Gout   . Helicobacter pylori gastritis   . Hyperlipidemia   . Hypertension   . Hyperthyroidism     Assessment/Plan: 3 Days Post-Op Procedure(s) (LRB): ARTHROSCOPIC  WASHOUT OF LEFT KNEE (Left) Principal Problem:   Septic arthritis of knee, left (HCC) Active Problems:   Hypertension   Hypothyroidism   Streptococcal bacteremia   Acute renal insufficiency   Normocytic anemia   Thrombocytopenia (HCC)   Multilevel degenerative disc disease   GERD (gastroesophageal reflux disease)   Dyslipidemia   Bladder outflow obstruction  Estimated body mass index is 28.48 kg/m as calculated from the following:   Height as of this encounter: 6' (1.829 m).   Weight as of this encounter: 95.3 kg (210 lb). Advance diet Up with therapy  DVT Prophylaxis - Lovenox Weight-Bearing as tolerated   Doing much better in regards to his left knee. Will follow in office in 1 week. Stable for DC home from ortho perspective.   Ardeen Jourdain, PA-C Orthopaedic Surgery 03/28/2016, 7:33 AM

## 2016-03-28 NOTE — Discharge Instructions (Signed)
Zocor changed to Lipitor due to interaction with Cardizem.   Change dressing over incision daily. Shower only, no tub baths.  Follow up in office in 1 week for wound check. Call if any temperatures greater than 101 or any wound complications: [phone] during the day and ask for Dr. Charlestine Night nurse, Brunilda Payor.

## 2016-03-28 NOTE — Progress Notes (Signed)
Foley care and maintenance given to pt and wife. Supplies given for discharge home. Eulas Post, RN

## 2016-03-28 NOTE — Progress Notes (Signed)
[facility]  Date of Admission:  03/24/2016    Total days of antibiotics 5        Day 3 ceftriaxone         Principal Problem:   Septic arthritis of knee, left (HCC) Active Problems:   Streptococcal bacteremia   Acute renal insufficiency   Hypertension   Hypothyroidism   Normocytic anemia   Thrombocytopenia (HCC)   Multilevel degenerative disc disease   GERD (gastroesophageal reflux disease)   Dyslipidemia   Bladder outflow obstruction   . allopurinol  300 mg Oral Daily  . atorvastatin  20 mg Oral q1800  . cefTRIAXone (ROCEPHIN)  IV  2 g Intravenous Q24H  . Chlorhexidine Gluconate Cloth  6 each Topical Q0600  . diltiazem  420 mg Oral Daily  . enoxaparin (LOVENOX) injection  40 mg Subcutaneous Q24H  . ferrous sulfate  325 mg Oral TID PC  . gabapentin  300 mg Oral TID  . levothyroxine  50 mcg Oral QAC breakfast  . mupirocin ointment  1 application Nasal BID  . pantoprazole  40 mg Oral Daily  . senna  1 tablet Oral BID  . tamsulosin  0.4 mg Oral QPC breakfast    SUBJECTIVE: He is feeling better. He walked to the bathroom with the physical therapist yesterday. His drains are out and he is having little to no pain in his left knee now. His Foley catheter remains in for bladder outlet obstruction and urinary retention.  Review of Systems: Review of Systems  Constitutional: Negative for chills, diaphoresis and fever.  Gastrointestinal: Negative for abdominal pain, diarrhea, nausea and vomiting.  Musculoskeletal: Negative for joint pain.    Past Medical History:  Diagnosis Date  . Arthritis    spine and hands  . Borderline diabetes   . Colon polyps    Tubular Adenoma   . GERD (gastroesophageal reflux disease)   . Gout   . Helicobacter pylori gastritis   . Hyperlipidemia   . Hypertension   . Hyperthyroidism     Social History  Substance Use Topics  . Smoking status: Never Smoker  . Smokeless tobacco: Never Used  . Alcohol  use No    Family History  Problem Relation Age of Onset  . Diabetes Mother   . Colon cancer Brother     1's  . Diabetes Brother    Allergies  Allergen Reactions  . Oxycontin [Oxycodone Hcl] Nausea And Vomiting and Other (See Comments)    Dizziness   . Valsartan-Hydrochlorothiazide Diarrhea    OBJECTIVE: Vitals:   03/27/16 0457 03/27/16 1300 03/27/16 2143 03/28/16 0546  BP: (!) 118/55 (!) 143/57 (!) 119/53 (!) 114/55  Pulse: 60 76 66 60  Resp: 18 16 20 18   Temp: 99 F (37.2 C) 97.9 F (36.6 C) (!) 100.9 F (38.3 C) 99.4 F (37.4 C)  TempSrc: Oral Axillary Oral Oral  SpO2: 94% 96% 93% 94%  Weight:      Height:       Body mass index is 28.48 kg/m.  Physical Exam  Constitutional: He is oriented to person, place, and time.  He is sleeping quietly but arouses quickly. He appears comfortable.  Musculoskeletal:  Left knee incisions look good. There is minimal swelling of his left knee with no redness or warmth.  Neurological: He is alert and oriented to person, place, and time.  Skin: No rash noted.  Psychiatric: Mood and affect normal.  Lab Results Lab Results  Component Value Date   WBC 9.3 03/26/2016   HGB 11.7 (L) 03/26/2016   HCT 33.9 (L) 03/26/2016   MCV 92.4 03/26/2016   PLT 122 (L) 03/26/2016    Lab Results  Component Value Date   CREATININE 1.23 03/28/2016   BUN 30 (H) 03/28/2016   NA 140 03/28/2016   K 4.1 03/28/2016   CL 112 (H) 03/28/2016   CO2 22 03/28/2016    Lab Results  Component Value Date   ALT 32 03/28/2016   AST 31 03/28/2016   ALKPHOS 54 03/28/2016   BILITOT 0.8 03/28/2016    No results found for: ESRSEDRATE, CRP   Microbiology: Recent Results (from the past 240 hour(s))  Blood culture (routine x 2)     Status: Abnormal   Collection Time: 03/24/16  6:38 AM  Result Value Ref Range Status   Specimen Description BLOOD RIGHT HAND  Final   Special Requests BOTTLES DRAWN AEROBIC AND ANAEROBIC 5ML  Final   Culture  Setup Time    Final    GRAM POSITIVE COCCI IN CHAINS IN PAIRS ANAEROBIC BOTTLE ONLY CRITICAL RESULT CALLED TO, READ BACK BY AND VERIFIED WITH: BETH GREEN, PHARMD @0050  03/25/16 MKELLY,MLT Performed at [facility], [place] [address] [place], [place] [address]    Culture STREPTOCOCCUS GROUP G (A)  Final   Report Status 03/26/2016 FINAL  Final   Organism ID, Bacteria STREPTOCOCCUS GROUP G  Final      Susceptibility   Streptococcus group g - MIC*    CLINDAMYCIN >=1 RESISTANT Resistant     AMPICILLIN <=0.25 SENSITIVE Sensitive     ERYTHROMYCIN >=8 RESISTANT Resistant     VANCOMYCIN 0.5 SENSITIVE Sensitive     CEFTRIAXONE <=0.12 SENSITIVE Sensitive     LEVOFLOXACIN 0.5 SENSITIVE Sensitive     * STREPTOCOCCUS GROUP G  Blood Culture ID Panel (Reflexed)     Status: Abnormal   Collection Time: 03/24/16  6:38 AM  Result Value Ref Range Status   Enterococcus species NOT DETECTED NOT DETECTED Final   Listeria monocytogenes NOT DETECTED NOT DETECTED Final   Staphylococcus species NOT DETECTED NOT DETECTED Final   Staphylococcus aureus NOT DETECTED NOT DETECTED Final   Streptococcus species DETECTED (A) NOT DETECTED Final    Comment: Not Enterococcus species, Streptococcus agalactiae, Streptococcus pyogenes, or Streptococcus pneumoniae. CRITICAL RESULT CALLED TO, READ BACK BY AND VERIFIED WITH: BETH GREEN, PHARMD @0050  03/25/16 MKELLY,MLT    Streptococcus agalactiae NOT DETECTED NOT DETECTED Final   Streptococcus pneumoniae NOT DETECTED NOT DETECTED Final   Streptococcus pyogenes NOT DETECTED NOT DETECTED Final   Acinetobacter baumannii NOT DETECTED NOT DETECTED Final   Enterobacteriaceae species NOT DETECTED NOT DETECTED Final   Enterobacter cloacae complex NOT DETECTED NOT DETECTED Final   Escherichia coli NOT DETECTED NOT DETECTED Final   Klebsiella oxytoca NOT DETECTED NOT DETECTED Final   Klebsiella pneumoniae NOT DETECTED NOT DETECTED Final   Proteus species NOT DETECTED NOT DETECTED Final    Serratia marcescens NOT DETECTED NOT DETECTED Final   Haemophilus influenzae NOT DETECTED NOT DETECTED Final   Neisseria meningitidis NOT DETECTED NOT DETECTED Final   Pseudomonas aeruginosa NOT DETECTED NOT DETECTED Final   Candida albicans NOT DETECTED NOT DETECTED Final   Candida glabrata NOT DETECTED NOT DETECTED Final   Candida krusei NOT DETECTED NOT DETECTED Final   Candida parapsilosis NOT DETECTED NOT DETECTED Final   Candida tropicalis NOT DETECTED NOT DETECTED Final    Comment: Performed  at [facility], [place] [address] [place], [place] [address]  Urine culture     Status: Abnormal   Collection Time: 03/24/16  6:48 AM  Result Value Ref Range Status   Specimen Description URINE, CLEAN CATCH  Final   Special Requests NONE  Final   Culture MULTIPLE SPECIES PRESENT, SUGGEST RECOLLECTION (A)  Final   Report Status 03/25/2016 FINAL  Final  Blood culture (routine x 2)     Status: None (Preliminary result)   Collection Time: 03/24/16  6:59 AM  Result Value Ref Range Status   Specimen Description BLOOD LEFT FOREARM  Final   Special Requests BOTTLES DRAWN AEROBIC AND ANAEROBIC 5ML  Final   Culture   Final    NO GROWTH 3 DAYS Performed at [facility], [place] [address] [place], [place] [address]    Report Status PENDING  Incomplete  Anaerobic culture     Status: None (Preliminary result)   Collection Time: 03/24/16  7:20 AM  Result Value Ref Range Status   Specimen Description KNEE LEFT  Final   Special Requests NONE  Final   Culture   Final    NO ANAEROBES ISOLATED; CULTURE IN PROGRESS FOR 5 DAYS   Report Status PENDING  Incomplete  Body fluid culture     Status: None (Preliminary result)   Collection Time: 03/24/16  7:30 AM  Result Value Ref Range Status   Specimen Description KNEE LEFT  Final   Special Requests NONE  Final   Gram Stain   Final    WBC PRESENT,BOTH PMN AND MONONUCLEAR NO ORGANISMS SEEN CYTOSPIN SMEAR Gram Stain Report Called to,Read Back By and  Verified With: ROSSER,M. RN @0859  ON 2.5.18 BY [facility]    Culture   Final    RARE STREPTOCOCCUS GROUP G Results Called to: RN Lollie Marrow [id number] MLM SUSCEPTIBILITIES TO FOLLOW Performed at [facility], [place] [address], [place], [place] [address]    Report Status PENDING  Incomplete  Surgical pcr screen     Status: Abnormal   Collection Time: 03/24/16  2:32 PM  Result Value Ref Range Status   MRSA, PCR POSITIVE (A) NEGATIVE Final    Comment: RESULT CALLED TO, READ BACK BY AND VERIFIED WITH: L.HUDSON RN AT 1950 ON 03/24/16 BY S.VANHOORNE    Staphylococcus aureus POSITIVE (A) NEGATIVE Final    Comment:        The Xpert SA Assay (FDA approved for NASAL specimens in patients over 65 years of age), is one component of a comprehensive surveillance program.  Test performance has been validated by [facility] for patients greater than or equal to 53 year old. It is not intended to diagnose infection nor to guide or monitor treatment.   Aerobic/Anaerobic Culture (surgical/deep wound)     Status: None (Preliminary result)   Collection Time: 03/25/16  4:33 PM  Result Value Ref Range Status   Specimen Description WOUND LEFT KNEE  Final   Special Requests NONE  Final   Gram Stain   Final    RARE WBC PRESENT, PREDOMINANTLY PMN NO ORGANISMS SEEN    Culture   Final    NO GROWTH 2 DAYS NO ANAEROBES ISOLATED; CULTURE IN PROGRESS FOR 5 DAYS Performed at [facility], [place] [address], [place], [place] [address]    Report Status PENDING  Incomplete     ASSESSMENT: He is improving on therapy for acute group G streptococcal bacteremia and septic arthritis of his  left knee. His acute renal insufficiency is returning back to baseline. I agree with discharge on oral levofloxacin 500 mg daily. I will arrange to see him back in my clinic on 04/16/2016.  PLAN: 1. Agree with discharge on oral levofloxacin 2. I will arrange follow-up in my clinic on 04/16/2016 3. I will sign off  now  Michel Bickers, MD [facility] for [place] [phone] pager   [phone] cell 03/28/2016, 9:17 AM

## 2016-03-28 NOTE — Progress Notes (Signed)
Physical Therapy Treatment Patient Details Name: HUMBERTO WEDLAKE MRN: [id number] DOB: 26-Apr-1940 Today's Date: 03/28/2016    History of Present Illness 76 y.o. male with past medical history of arthritis, gout, GERD, hypertension, chronic lower back pain, he presents to ED secondary to left knee pain, chills, patient reports is been having worsening lower back pain over last few weeks recently seen by Dr. Dellis Filbert, given steroid taper. Presented with left knee pain where he fell due to his knee giving out and was unable to get up from the floor and now s/p L knee arthroscopic irrigation debridement 03/25/16    PT Comments    Pt reports pain a little better today and demonstrates improved mobility however distance remains limited due to fatigue.  Mobility also very effortful and not fluid.  Pt reports readiness for d/c home however spouse seems hesitant.  Spouse declined further discussion for d/c to SNF for rehab, prefers home.  Possible d/c home today, so strongly encouraged pt to mobilize cautiously and slowly.   Follow Up Recommendations  Home health PT;Supervision for mobility/OOB     Equipment Recommendations  Rolling walker with 5" wheels;Wheelchair cushion (measurements PT);3in1 (PT)    Recommendations for Other Services       Precautions / Restrictions Precautions Precautions: Fall Restrictions Other Position/Activity Restrictions: WBAT    Mobility  Bed Mobility Overal bed mobility: Needs Assistance Bed Mobility: Supine to Sit     Supine to sit: HOB elevated;Supervision     General bed mobility comments: pt self assist L LE over EOB  Transfers Overall transfer level: Needs assistance Equipment used: Rolling walker (2 wheeled) Transfers: Sit to/from Stand Sit to Stand: Min guard         General transfer comment: verbal cues for UE and LE positioning and safe technique, increased time and effort  Ambulation/Gait Ambulation/Gait assistance: Min guard Ambulation  Distance (Feet): 40 Feet Assistive device: Rolling walker (2 wheeled) Gait Pattern/deviations: Step-to pattern;Decreased stance time - left;Antalgic;Trunk flexed Gait velocity: decr   General Gait Details: pt had R AFO with him today so donned shoes prior to ambulation, verbal cues for sequence, RW positioning, step length, distance limited by fatigue   Stairs            Wheelchair Mobility    Modified Rankin (Stroke Patients Only)       Balance                                    Cognition Arousal/Alertness: Awake/alert Behavior During Therapy: WFL for tasks assessed/performed Overall Cognitive Status: Within Functional Limits for tasks assessed                      Exercises      General Comments        Pertinent Vitals/Pain Pain Assessment: 0-10 Pain Score: 3  Pain Location: L knee  Pain Intervention(s): Limited activity within patient's tolerance;Repositioned;Premedicated before session;Monitored during session    Home Living                      Prior Function            PT Goals (current goals can now be found in the care plan section) Progress towards PT goals: Progressing toward goals    Frequency    Min 5X/week      PT Plan Current plan remains appropriate  Co-evaluation             End of Session Equipment Utilized During Treatment: Gait belt Activity Tolerance: Patient limited by fatigue Patient left: in chair;with call bell/phone within reach;with family/visitor present     Time: [id number] PT Time Calculation (min) (ACUTE ONLY): 26 min  Charges:  $Gait Training: 8-22 mins                    G Codes:      Eldredge Veldhuizen,KATHrine E 04/18/2016, 1:16 PM Carmelia Bake, PT, DPT 2016-04-18 Pager: [phone]

## 2016-03-29 ENCOUNTER — Encounter (HOSPITAL_COMMUNITY): Payer: Self-pay | Admitting: *Deleted

## 2016-03-29 ENCOUNTER — Inpatient Hospital Stay (HOSPITAL_COMMUNITY): Payer: Medicare Other

## 2016-03-29 ENCOUNTER — Inpatient Hospital Stay (HOSPITAL_COMMUNITY)
Admission: EM | Admit: 2016-03-29 | Discharge: 2016-04-01 | DRG: 854 | Disposition: A | Discharge status: Home / Self Care | Payer: Medicare Other | Attending: Internal Medicine | Admitting: Internal Medicine

## 2016-03-29 DIAGNOSIS — I959 Hypotension, unspecified: Secondary | ICD-10-CM | POA: Diagnosis present

## 2016-03-29 DIAGNOSIS — M00262 Other streptococcal arthritis, left knee: Secondary | ICD-10-CM | POA: Diagnosis not present

## 2016-03-29 DIAGNOSIS — Z79899 Other long term (current) drug therapy: Secondary | ICD-10-CM

## 2016-03-29 DIAGNOSIS — E039 Hypothyroidism, unspecified: Secondary | ICD-10-CM | POA: Diagnosis present

## 2016-03-29 DIAGNOSIS — R739 Hyperglycemia, unspecified: Secondary | ICD-10-CM | POA: Diagnosis not present

## 2016-03-29 DIAGNOSIS — M009 Pyogenic arthritis, unspecified: Secondary | ICD-10-CM

## 2016-03-29 DIAGNOSIS — I82402 Acute embolism and thrombosis of unspecified deep veins of left lower extremity: Secondary | ICD-10-CM | POA: Diagnosis present

## 2016-03-29 DIAGNOSIS — R008 Other abnormalities of heart beat: Secondary | ICD-10-CM | POA: Diagnosis present

## 2016-03-29 DIAGNOSIS — M00862 Arthritis due to other bacteria, left knee: Secondary | ICD-10-CM | POA: Diagnosis not present

## 2016-03-29 DIAGNOSIS — M7989 Other specified soft tissue disorders: Secondary | ICD-10-CM | POA: Diagnosis not present

## 2016-03-29 DIAGNOSIS — D696 Thrombocytopenia, unspecified: Secondary | ICD-10-CM | POA: Diagnosis not present

## 2016-03-29 DIAGNOSIS — Z885 Allergy status to narcotic agent status: Secondary | ICD-10-CM | POA: Diagnosis not present

## 2016-03-29 DIAGNOSIS — M609 Myositis, unspecified: Secondary | ICD-10-CM | POA: Diagnosis present

## 2016-03-29 DIAGNOSIS — M10062 Idiopathic gout, left knee: Secondary | ICD-10-CM | POA: Diagnosis not present

## 2016-03-29 DIAGNOSIS — K5900 Constipation, unspecified: Secondary | ICD-10-CM | POA: Diagnosis not present

## 2016-03-29 DIAGNOSIS — B955 Unspecified streptococcus as the cause of diseases classified elsewhere: Secondary | ICD-10-CM | POA: Diagnosis present

## 2016-03-29 DIAGNOSIS — T380X5A Adverse effect of glucocorticoids and synthetic analogues, initial encounter: Secondary | ICD-10-CM | POA: Diagnosis not present

## 2016-03-29 DIAGNOSIS — Z9889 Other specified postprocedural states: Secondary | ICD-10-CM | POA: Diagnosis not present

## 2016-03-29 DIAGNOSIS — M67262 Synovial hypertrophy, not elsewhere classified, left lower leg: Secondary | ICD-10-CM | POA: Diagnosis not present

## 2016-03-29 DIAGNOSIS — M65162 Other infective (teno)synovitis, left knee: Secondary | ICD-10-CM | POA: Diagnosis not present

## 2016-03-29 DIAGNOSIS — A419 Sepsis, unspecified organism: Principal | ICD-10-CM | POA: Diagnosis present

## 2016-03-29 DIAGNOSIS — M109 Gout, unspecified: Secondary | ICD-10-CM | POA: Diagnosis not present

## 2016-03-29 DIAGNOSIS — N139 Obstructive and reflux uropathy, unspecified: Secondary | ICD-10-CM | POA: Diagnosis not present

## 2016-03-29 DIAGNOSIS — M1000 Idiopathic gout, unspecified site: Secondary | ICD-10-CM | POA: Diagnosis not present

## 2016-03-29 DIAGNOSIS — Z888 Allergy status to other drugs, medicaments and biological substances status: Secondary | ICD-10-CM

## 2016-03-29 DIAGNOSIS — R7881 Bacteremia: Secondary | ICD-10-CM | POA: Diagnosis not present

## 2016-03-29 DIAGNOSIS — N320 Bladder-neck obstruction: Secondary | ICD-10-CM | POA: Diagnosis present

## 2016-03-29 DIAGNOSIS — R001 Bradycardia, unspecified: Secondary | ICD-10-CM | POA: Diagnosis not present

## 2016-03-29 DIAGNOSIS — B954 Other streptococcus as the cause of diseases classified elsewhere: Secondary | ICD-10-CM | POA: Diagnosis present

## 2016-03-29 DIAGNOSIS — Z800 Family history of malignant neoplasm of digestive organs: Secondary | ICD-10-CM | POA: Diagnosis not present

## 2016-03-29 DIAGNOSIS — R7303 Prediabetes: Secondary | ICD-10-CM | POA: Diagnosis present

## 2016-03-29 DIAGNOSIS — Z9689 Presence of other specified functional implants: Secondary | ICD-10-CM | POA: Diagnosis not present

## 2016-03-29 DIAGNOSIS — N401 Enlarged prostate with lower urinary tract symptoms: Secondary | ICD-10-CM | POA: Diagnosis not present

## 2016-03-29 DIAGNOSIS — I10 Essential (primary) hypertension: Secondary | ICD-10-CM | POA: Diagnosis not present

## 2016-03-29 DIAGNOSIS — Z833 Family history of diabetes mellitus: Secondary | ICD-10-CM | POA: Diagnosis not present

## 2016-03-29 DIAGNOSIS — M25462 Effusion, left knee: Secondary | ICD-10-CM | POA: Diagnosis not present

## 2016-03-29 DIAGNOSIS — E785 Hyperlipidemia, unspecified: Secondary | ICD-10-CM | POA: Diagnosis present

## 2016-03-29 DIAGNOSIS — M545 Low back pain: Secondary | ICD-10-CM | POA: Diagnosis not present

## 2016-03-29 DIAGNOSIS — M1991 Primary osteoarthritis, unspecified site: Secondary | ICD-10-CM | POA: Diagnosis not present

## 2016-03-29 DIAGNOSIS — I824Z2 Acute embolism and thrombosis of unspecified deep veins of left distal lower extremity: Secondary | ICD-10-CM | POA: Diagnosis not present

## 2016-03-29 LAB — CBC WITH DIFFERENTIAL/PLATELET
Basophils Absolute: 0 10*3/uL (ref 0.0–0.1)
Basophils Relative: 0 %
Eosinophils Absolute: 0 10*3/uL (ref 0.0–0.7)
Eosinophils Relative: 0 %
HCT: 31.5 % — ABNORMAL LOW (ref 39.0–52.0)
Hemoglobin: 10.7 g/dL — ABNORMAL LOW (ref 13.0–17.0)
LYMPHS ABS: 1.6 10*3/uL (ref 0.7–4.0)
LYMPHS PCT: 13 %
MCH: 31.1 pg (ref 26.0–34.0)
MCHC: 34 g/dL (ref 30.0–36.0)
MCV: 91.6 fL (ref 78.0–100.0)
MONO ABS: 0.9 10*3/uL (ref 0.1–1.0)
MONOS PCT: 8 %
Neutro Abs: 9.7 10*3/uL — ABNORMAL HIGH (ref 1.7–7.7)
Neutrophils Relative %: 79 %
PLATELETS: 186 10*3/uL (ref 150–400)
RBC: 3.44 MIL/uL — ABNORMAL LOW (ref 4.22–5.81)
RDW: 14.4 % (ref 11.5–15.5)
WBC: 12.3 10*3/uL — ABNORMAL HIGH (ref 4.0–10.5)

## 2016-03-29 LAB — COMPREHENSIVE METABOLIC PANEL
ALBUMIN: 2.8 g/dL — AB (ref 3.5–5.0)
ALT: 56 U/L (ref 17–63)
AST: 66 U/L — AB (ref 15–41)
Alkaline Phosphatase: 87 U/L (ref 38–126)
Anion gap: 7 (ref 5–15)
BUN: 30 mg/dL — ABNORMAL HIGH (ref 6–20)
CHLORIDE: 109 mmol/L (ref 101–111)
CO2: 24 mmol/L (ref 22–32)
Calcium: 8.7 mg/dL — ABNORMAL LOW (ref 8.9–10.3)
Creatinine, Ser: 1.39 mg/dL — ABNORMAL HIGH (ref 0.61–1.24)
GFR calc Af Amer: 56 mL/min — ABNORMAL LOW (ref >60)
GFR calc non Af Amer: 48 mL/min — ABNORMAL LOW (ref >60)
GLUCOSE: 145 mg/dL — AB (ref 65–99)
POTASSIUM: 4 mmol/L (ref 3.5–5.1)
Sodium: 140 mmol/L (ref 135–145)
Total Bilirubin: 1.1 mg/dL (ref 0.3–1.2)
Total Protein: 6.4 g/dL — ABNORMAL LOW (ref 6.5–8.1)

## 2016-03-29 LAB — URINALYSIS, ROUTINE W REFLEX MICROSCOPIC
Bilirubin Urine: NEGATIVE
Glucose, UA: NEGATIVE mg/dL
KETONES UR: NEGATIVE mg/dL
NITRITE: NEGATIVE
PH: 5 (ref 5.0–8.0)
Protein, ur: 30 mg/dL — AB
Specific Gravity, Urine: 1.019 (ref 1.005–1.030)

## 2016-03-29 LAB — ANAEROBIC CULTURE

## 2016-03-29 LAB — CULTURE, BLOOD (ROUTINE X 2): Culture: NO GROWTH

## 2016-03-29 LAB — CBG MONITORING, ED: GLUCOSE-CAPILLARY: 129 mg/dL — AB (ref 65–99)

## 2016-03-29 LAB — I-STAT CG4 LACTIC ACID, ED: LACTIC ACID, VENOUS: 1.12 mmol/L (ref 0.5–1.9)

## 2016-03-29 LAB — SYNOVIAL CELL COUNT + DIFF, W/ CRYSTALS
EOSINOPHILS-SYNOVIAL: 0 % (ref 0–1)
LYMPHOCYTES-SYNOVIAL FLD: 5 % (ref 0–20)
MONOCYTE-MACROPHAGE-SYNOVIAL FLUID: 2 % — AB (ref 50–90)
Neutrophil, Synovial: 93 % — ABNORMAL HIGH (ref 0–25)
WBC, Synovial: 27610 /mm3 — ABNORMAL HIGH (ref 0–200)

## 2016-03-29 MED ORDER — ATORVASTATIN CALCIUM 10 MG PO TABS
20.0000 mg | ORAL_TABLET | Freq: Every day | ORAL | Status: DC
  Administered 2016-03-30 – 2016-03-31 (×2): 20 mg via ORAL
  Filled 2016-03-29 (×3): qty 2

## 2016-03-29 MED ORDER — LEVOTHYROXINE SODIUM 50 MCG PO TABS
50.0000 ug | ORAL_TABLET | Freq: Every day | ORAL | Status: DC
  Administered 2016-03-31 – 2016-04-01 (×2): 50 ug via ORAL
  Filled 2016-03-29 (×3): qty 1

## 2016-03-29 MED ORDER — SODIUM CHLORIDE 0.9 % IV BOLUS (SEPSIS)
1000.0000 mL | Freq: Once | INTRAVENOUS | Status: AC
  Administered 2016-03-29: 1000 mL via INTRAVENOUS

## 2016-03-29 MED ORDER — DILTIAZEM HCL ER BEADS 120 MG PO CP24
420.0000 mg | ORAL_CAPSULE | Freq: Every day | ORAL | Status: DC
Start: 2016-03-30 — End: 2016-03-29

## 2016-03-29 MED ORDER — DEXTROSE 5 % IV SOLN: 2.0000 g | INTRAVENOUS | Status: DC

## 2016-03-29 MED ORDER — LEVOFLOXACIN IN D5W 500 MG/100ML IV SOLN
500.0000 mg | Freq: Once | INTRAVENOUS | Status: AC
  Administered 2016-03-29: 500 mg via INTRAVENOUS
  Filled 2016-03-29: qty 100

## 2016-03-29 MED ORDER — LIDOCAINE-EPINEPHRINE (PF) 2 %-1:200000 IJ SOLN
20.0000 mL | Freq: Once | INTRAMUSCULAR | Status: AC
  Administered 2016-03-29: 20 mL via INTRADERMAL

## 2016-03-29 MED ORDER — OXYCODONE HCL 5 MG PO TABS
5.0000 mg | ORAL_TABLET | ORAL | Status: DC | PRN
  Administered 2016-03-29 – 2016-03-31 (×3): 5 mg via ORAL
  Filled 2016-03-29 (×3): qty 1

## 2016-03-29 MED ORDER — SODIUM CHLORIDE 0.9 % IV BOLUS (SEPSIS)
1000.0000 mL | Freq: Once | INTRAVENOUS | Status: AC
Start: 2016-03-29 — End: 2016-03-29
  Administered 2016-03-29: 1000 mL via INTRAVENOUS

## 2016-03-29 MED ORDER — TAMSULOSIN HCL 0.4 MG PO CAPS: 0.4000 mg | ORAL_CAPSULE | Freq: Every day | ORAL | Status: DC

## 2016-03-29 MED ORDER — OMEGA-3-ACID ETHYL ESTERS 1 G PO CAPS
1.0000 g | ORAL_CAPSULE | Freq: Every day | ORAL | Status: DC
  Administered 2016-03-30 – 2016-04-01 (×3): 1 g via ORAL
  Filled 2016-03-29 (×3): qty 1

## 2016-03-29 MED ORDER — LIDOCAINE-EPINEPHRINE (PF) 2 %-1:200000 IJ SOLN
INTRAMUSCULAR | Status: AC
  Administered 2016-03-29: 20 mL via INTRADERMAL
  Filled 2016-03-29: qty 20

## 2016-03-29 MED ORDER — ALLOPURINOL 300 MG PO TABS
300.0000 mg | ORAL_TABLET | Freq: Every day | ORAL | Status: DC
  Administered 2016-03-30 – 2016-04-01 (×3): 300 mg via ORAL
  Filled 2016-03-29: qty 1
  Filled 2016-03-29 (×2): qty 3
  Filled 2016-03-29: qty 1

## 2016-03-29 MED ORDER — SODIUM CHLORIDE 0.9 % IV SOLN
INTRAVENOUS | Status: DC
  Administered 2016-03-29 – 2016-03-30 (×2): via INTRAVENOUS

## 2016-03-29 MED ORDER — FERROUS SULFATE 325 (65 FE) MG PO TABS
325.0000 mg | ORAL_TABLET | Freq: Three times a day (TID) | ORAL | Status: DC
  Administered 2016-03-30 – 2016-04-01 (×7): 325 mg via ORAL
  Filled 2016-03-29 (×7): qty 1

## 2016-03-29 MED ORDER — HEPARIN SODIUM (PORCINE) 5000 UNIT/ML IJ SOLN
5000.0000 [IU] | Freq: Three times a day (TID) | INTRAMUSCULAR | Status: DC
  Administered 2016-03-30 (×2): 5000 [IU] via SUBCUTANEOUS
  Filled 2016-03-29 (×2): qty 1

## 2016-03-29 MED ORDER — LEVOFLOXACIN 500 MG PO TABS: 500.0000 mg | ORAL_TABLET | Freq: Every day | ORAL | Status: DC

## 2016-03-29 MED ORDER — SENNA 8.6 MG PO TABS
1.0000 | ORAL_TABLET | Freq: Two times a day (BID) | ORAL | Status: DC
  Administered 2016-03-30 – 2016-04-01 (×6): 8.6 mg via ORAL
  Filled 2016-03-29 (×6): qty 1

## 2016-03-29 MED ORDER — DEXTROSE 5 % IV SOLN
2.0000 g | Freq: Every day | INTRAVENOUS | Status: DC
  Administered 2016-03-30 – 2016-03-31 (×3): 2 g via INTRAVENOUS
  Filled 2016-03-29 (×4): qty 2

## 2016-03-29 MED ORDER — POLYETHYLENE GLYCOL 3350 17 G PO PACK: 17.0000 g | PACK | Freq: Every day | ORAL | Status: DC | PRN

## 2016-03-29 MED ORDER — TAMSULOSIN HCL 0.4 MG PO CAPS
0.4000 mg | ORAL_CAPSULE | Freq: Every day | ORAL | Status: DC
  Administered 2016-03-30 – 2016-04-01 (×3): 0.4 mg via ORAL
  Filled 2016-03-29 (×3): qty 1

## 2016-03-29 MED ORDER — BISACODYL 10 MG RE SUPP: 10.0000 mg | Freq: Every day | RECTAL | Status: DC | PRN

## 2016-03-29 MED ORDER — GABAPENTIN 300 MG PO CAPS
300.0000 mg | ORAL_CAPSULE | Freq: Three times a day (TID) | ORAL | Status: DC
  Administered 2016-03-30 – 2016-04-01 (×8): 300 mg via ORAL
  Filled 2016-03-29 (×8): qty 1

## 2016-03-29 MED ORDER — BISACODYL 5 MG PO TBEC: 5.0000 mg | DELAYED_RELEASE_TABLET | Freq: Every day | ORAL | Status: DC | PRN

## 2016-03-29 MED ORDER — BISMUTH SUBSALICYLATE 262 MG PO CHEW
262.0000 mg | CHEWABLE_TABLET | Freq: Three times a day (TID) | ORAL | Status: DC | PRN
  Filled 2016-03-29: qty 1

## 2016-03-29 MED ORDER — ACETAMINOPHEN 500 MG PO TABS: 1000.0000 mg | ORAL_TABLET | Freq: Four times a day (QID) | ORAL | Status: DC | PRN

## 2016-03-29 NOTE — H&P (Addendum)
History and Physical    Zachary Mcdonald [id number] DOB: [birth year] DOA: 03/29/2016   PCP: Tivis Ringer, MD Chief Complaint: No chief complaint on file.   HPI: Zachary Mcdonald is a 76 y.o. male with medical history significant of septic arthritis of the L knee and bacteremia due to group G strep.  Initially patient was admitted, put on zosyn / vanc, and treated with washout on 2/5.  This was changed to rocephin when cultures came back with group G strep.  Hospital stay was complicated by AKI developing during the stay which was believed to be due to urinary retention and resolved with placement of foley catheter.  He got 3 doses of rocephin with last dose being given yesterday morning on 2/9.  Patient was discharged home with plans to switch to PO levaquin.  PO levaquin script apparently never arrived at pharmacy so he missed todays dose.  Last evening (evening of 2/9) he developed fever, chills.  Today had worsening knee pain, fever, chills, generalized weakness.  Symptoms are persistent and worsening.  Patient brought in to ED for symptoms.  Tm last evening was 101.7.  ED Course: Dr. Tonita Cong has re-tapped the patients knee.  WBC up to 12.6.  Tm in ED is 99.8 thus far.  BP running low [id number] high 0000000 systolic.  Given 1L bolus and Levaquin IV in ED.  Review of Systems: As per HPI otherwise 10 point review of systems negative.    Past Medical History:  Diagnosis Date  . Arthritis    spine and hands  . Borderline diabetes   . Colon polyps    Tubular Adenoma   . GERD (gastroesophageal reflux disease)   . Gout   . Helicobacter pylori gastritis   . Hyperlipidemia   . Hypertension   . Hyperthyroidism     Past Surgical History:  Procedure Laterality Date  . BACK SURGERY  2010  . BICEPS TENDON REPAIR Right 1999  . KNEE ARTHROSCOPY Left 03/25/2016   Procedure: ARTHROSCOPIC WASHOUT OF LEFT KNEE;  Surgeon: Latanya Maudlin, MD;  Location: [facility];  Service: Orthopedics;  Laterality:  Left;  . MOLE REMOVAL     face  . VASECTOMY  1978     reports that he has never smoked. He has never used smokeless tobacco. He reports that he does not drink alcohol or use drugs.  Allergies  Allergen Reactions  . Oxycontin [Oxycodone Hcl] Nausea And Vomiting and Other (See Comments)    Dizziness   . Valsartan-Hydrochlorothiazide Diarrhea    Family History  Problem Relation Age of Onset  . Diabetes Mother   . Colon cancer Brother     73's  . Diabetes Brother       Prior to Admission medications   Medication Sig Start Date End Date Taking? Authorizing Provider  acetaminophen (TYLENOL) 500 MG tablet Take 1,000-1,500 mg by mouth every 6 (six) hours as needed for mild pain, moderate pain or fever.    Yes Historical Provider, MD  allopurinol (ZYLOPRIM) 300 MG tablet Take 300 mg by mouth every morning.    Yes Historical Provider, MD  BENICAR HCT 40-25 MG tablet Take 1 tablet by mouth every morning.  06/16/15  Yes Historical Provider, MD  bismuth subsalicylate (PEPTO BISMOL) 262 MG chewable tablet Chew 1 tablet (262 mg total) by mouth 4 (four) times daily -  before meals and at bedtime. Patient taking differently: Chew 262 mg by mouth 3 (three) times daily as needed for indigestion.  12/05/15  Yes Jerene Bears, MD  diltiazem (TIAZAC) 420 MG 24 hr capsule Take 420 mg by mouth every morning.    Yes Historical Provider, MD  fish oil-omega-3 fatty acids 1000 MG capsule Take 2 g by mouth every morning.    Yes Historical Provider, MD  gabapentin (NEURONTIN) 300 MG capsule Take 300 mg by mouth 3 (three) times daily. 03/17/16  Yes Historical Provider, MD  levothyroxine (SYNTHROID, LEVOTHROID) 50 MCG tablet Take 50 mcg by mouth daily before breakfast.   Yes Historical Provider, MD  oxyCODONE (OXY IR/ROXICODONE) 5 MG immediate release tablet Take 1-2 tablets (5-10 mg total) by mouth every 4 (four) hours as needed for severe pain. 03/28/16  Yes Amber Cecilio Asper, PA-C  simvastatin (ZOCOR) 40 MG tablet  Take 40 mg by mouth every morning. 02/19/16  Yes Historical Provider, MD  tamsulosin (FLOMAX) 0.4 MG CAPS capsule Take 0.4 mg by mouth every morning.  04/05/15  Yes Historical Provider, MD  atorvastatin (LIPITOR) 20 MG tablet Take 1 tablet (20 mg total) by mouth daily at 6 PM. Patient not taking: Reported on 03/29/2016 03/28/16   Debbe Odea, MD  bisacodyl (DULCOLAX) 10 MG suppository Place 1 suppository (10 mg total) rectally daily as needed for moderate constipation. Patient not taking: Reported on 03/29/2016 03/27/16   Debbe Odea, MD  bisacodyl (DULCOLAX) 5 MG EC tablet Take 1 tablet (5 mg total) by mouth daily as needed for moderate constipation. Patient not taking: Reported on 03/29/2016 03/28/16   Debbe Odea, MD  ferrous sulfate 325 (65 FE) MG tablet Take 1 tablet (325 mg total) by mouth 3 (three) times daily after meals. Patient not taking: Reported on 03/29/2016 03/28/16   Debbe Odea, MD  levofloxacin (LEVAQUIN) 500 MG tablet Take 1 tablet (500 mg total) by mouth daily. Patient not taking: Reported on 03/29/2016 03/27/16   Debbe Odea, MD  polyethylene glycol (MIRALAX / GLYCOLAX) packet Take 17 g by mouth daily as needed for mild constipation. Patient not taking: Reported on 03/29/2016 03/28/16   Debbe Odea, MD  senna (SENOKOT) 8.6 MG TABS tablet Take 1 tablet (8.6 mg total) by mouth 2 (two) times daily. Patient not taking: Reported on 03/29/2016 03/28/16   Debbe Odea, MD    Physical Exam: Vitals:   03/29/16 1837 03/29/16 1946 03/29/16 2202 03/29/16 2220  BP: (!) 106/48  (!) 98/48 (!) 109/50  Pulse: 64  71 68  Resp: 18  18 18   Temp: 99.6 F (37.6 C) 99.8 F (37.7 C)  98.3 F (36.8 C)  TempSrc: Oral Rectal  Oral  SpO2: 96%  99% 97%  Weight: 95.3 kg (210 lb)     Height: 6' (1.829 m)         Constitutional: NAD, calm, comfortable Eyes: PERRL, lids and conjunctivae normal ENMT: Mucous membranes are moist. Posterior pharynx clear of any exudate or lesions.Normal dentition.  Neck:  normal, supple, no masses, no thyromegaly Respiratory: clear to auscultation bilaterally, no wheezing, no crackles. Normal respiratory effort. No accessory muscle use.  Cardiovascular: Regular rate and rhythm, no murmurs / rubs / gallops. No extremity edema. 2+ pedal pulses. No carotid bruits.  Abdomen: no tenderness, no masses palpated. No hepatosplenomegaly. Bowel sounds positive.  Musculoskeletal: Tap being performed on L knee by Dr. Tonita Cong at this time, LLE is edematous below knee compared to R. Skin: no rashes, lesions, ulcers. No induration Neurologic: CN 2-12 grossly intact. Sensation intact, DTR normal. Strength 5/5 in all 4.  Psychiatric: Normal judgment and insight. Alert and oriented  x 3. Normal mood.    Labs on Admission: I have personally reviewed following labs and imaging studies  CBC:  Recent Labs Lab 03/24/16 0638  03/25/16 0528 03/25/16 1905 03/26/16 0553 03/26/16 1721 03/29/16 2022  WBC 18.5*  < > 15.8* 12.1* 11.2* 9.3 12.3*  NEUTROABS 15.2*  --  12.9*  --   --  6.7 9.7*  HGB 14.1  < > 12.5* 12.0* 11.6* 11.7* 10.7*  HCT 39.8  < > 35.0* 34.7* 34.1* 33.9* 31.5*  MCV 87.1  < > 91.1 90.4 91.4 92.4 91.6  PLT 186  < > 139* 135* 128* 122* 186  < > = values in this interval not displayed. Basic Metabolic Panel:  Recent Labs Lab 03/25/16 1900 03/25/16 1905 03/26/16 0553 03/27/16 0541 03/28/16 0533 03/29/16 2022  NA 140  --  139 141 140 140  K 4.6  --  4.4 4.3 4.1 4.0  CL 114*  --  110 113* 112* 109  CO2 17*  --  22 21* 22 24  GLUCOSE 149*  --  120* 124* 146* 145*  BUN 40*  --  48* 42* 30* 30*  CREATININE 3.05* 3.11* 3.77* 1.97* 1.23 1.39*  CALCIUM 8.0*  --  8.3* 8.4* 8.5* 8.7*   GFR: Estimated Creatinine Clearance: 55 mL/min (by C-G formula based on SCr of 1.39 mg/dL (H)). Liver Function Tests:  Recent Labs Lab 03/25/16 0528 03/25/16 1900 03/27/16 0541 03/28/16 0533 03/29/16 2022  AST 52* 47* 29 31 66*  ALT 27 28 23  32 56  ALKPHOS 37* 40 38 54 87    BILITOT 1.6* 1.0 1.1 0.8 1.1  PROT 5.3* 5.5* 5.2* 5.5* 6.4*  ALBUMIN 3.0* 2.9* 2.5* 2.5* 2.8*   No results for input(s): LIPASE, AMYLASE in the last 168 hours. No results for input(s): AMMONIA in the last 168 hours. Coagulation Profile:  Recent Labs Lab 03/25/16 0528  INR 1.36   Cardiac Enzymes: No results for input(s): CKTOTAL, CKMB, CKMBINDEX, TROPONINI in the last 168 hours. BNP (last 3 results) No results for input(s): PROBNP in the last 8760 hours. HbA1C: No results for input(s): HGBA1C in the last 72 hours. CBG:  Recent Labs Lab 03/24/16 2107 03/29/16 2204  GLUCAP 131* 129*   Lipid Profile: No results for input(s): CHOL, HDL, LDLCALC, TRIG, CHOLHDL, LDLDIRECT in the last 72 hours. Thyroid Function Tests: No results for input(s): TSH, T4TOTAL, FREET4, T3FREE, THYROIDAB in the last 72 hours. Anemia Panel: No results for input(s): VITAMINB12, FOLATE, FERRITIN, TIBC, IRON, RETICCTPCT in the last 72 hours. Urine analysis:    Component Value Date/Time   COLORURINE YELLOW 03/24/2016 0648   APPEARANCEUR CLEAR 03/24/2016 0648   LABSPEC 1.017 03/24/2016 0648   PHURINE 5.0 03/24/2016 0648   GLUCOSEU NEGATIVE 03/24/2016 0648   HGBUR NEGATIVE 03/24/2016 0648   BILIRUBINUR NEGATIVE 03/24/2016 0648   KETONESUR NEGATIVE 03/24/2016 0648   PROTEINUR NEGATIVE 03/24/2016 0648   NITRITE NEGATIVE 03/24/2016 0648   LEUKOCYTESUR NEGATIVE 03/24/2016 0648   Sepsis Labs: @LABRCNTIP (procalcitonin:4,lacticidven:4) ) Recent Results (from the past 240 hour(s))  Blood culture (routine x 2)     Status: Abnormal   Collection Time: 03/24/16  6:38 AM  Result Value Ref Range Status   Specimen Description BLOOD RIGHT HAND  Final   Special Requests BOTTLES DRAWN AEROBIC AND ANAEROBIC 5ML  Final   Culture  Setup Time   Final    GRAM POSITIVE COCCI IN CHAINS IN PAIRS ANAEROBIC BOTTLE ONLY CRITICAL RESULT CALLED TO, READ BACK BY AND  VERIFIED WITH: BETH GREEN, PHARMD @0050  03/25/16  MKELLY,MLT Performed at [facility] [address], [place], [place] [address]    Culture STREPTOCOCCUS GROUP G (A)  Final   Report Status 03/26/2016 FINAL  Final   Organism ID, Bacteria STREPTOCOCCUS GROUP G  Final      Susceptibility   Streptococcus group g - MIC*    CLINDAMYCIN >=1 RESISTANT Resistant     AMPICILLIN <=0.25 SENSITIVE Sensitive     ERYTHROMYCIN >=8 RESISTANT Resistant     VANCOMYCIN 0.5 SENSITIVE Sensitive     CEFTRIAXONE <=0.12 SENSITIVE Sensitive     LEVOFLOXACIN 0.5 SENSITIVE Sensitive     * STREPTOCOCCUS GROUP G  Blood Culture ID Panel (Reflexed)     Status: Abnormal   Collection Time: 03/24/16  6:38 AM  Result Value Ref Range Status   Enterococcus species NOT DETECTED NOT DETECTED Final   Listeria monocytogenes NOT DETECTED NOT DETECTED Final   Staphylococcus species NOT DETECTED NOT DETECTED Final   Staphylococcus aureus NOT DETECTED NOT DETECTED Final   Streptococcus species DETECTED (A) NOT DETECTED Final    Comment: Not Enterococcus species, Streptococcus agalactiae, Streptococcus pyogenes, or Streptococcus pneumoniae. CRITICAL RESULT CALLED TO, READ BACK BY AND VERIFIED WITH: BETH GREEN, PHARMD @0050  03/25/16 MKELLY,MLT    Streptococcus agalactiae NOT DETECTED NOT DETECTED Final   Streptococcus pneumoniae NOT DETECTED NOT DETECTED Final   Streptococcus pyogenes NOT DETECTED NOT DETECTED Final   Acinetobacter baumannii NOT DETECTED NOT DETECTED Final   Enterobacteriaceae species NOT DETECTED NOT DETECTED Final   Enterobacter cloacae complex NOT DETECTED NOT DETECTED Final   Escherichia coli NOT DETECTED NOT DETECTED Final   Klebsiella oxytoca NOT DETECTED NOT DETECTED Final   Klebsiella pneumoniae NOT DETECTED NOT DETECTED Final   Proteus species NOT DETECTED NOT DETECTED Final   Serratia marcescens NOT DETECTED NOT DETECTED Final   Haemophilus influenzae NOT DETECTED NOT DETECTED Final   Neisseria meningitidis NOT DETECTED NOT DETECTED Final    Pseudomonas aeruginosa NOT DETECTED NOT DETECTED Final   Candida albicans NOT DETECTED NOT DETECTED Final   Candida glabrata NOT DETECTED NOT DETECTED Final   Candida krusei NOT DETECTED NOT DETECTED Final   Candida parapsilosis NOT DETECTED NOT DETECTED Final   Candida tropicalis NOT DETECTED NOT DETECTED Final    Comment: Performed at [facility], [place] [address] [place], [place] [address]  Urine culture     Status: Abnormal   Collection Time: 03/24/16  6:48 AM  Result Value Ref Range Status   Specimen Description URINE, CLEAN CATCH  Final   Special Requests NONE  Final   Culture MULTIPLE SPECIES PRESENT, SUGGEST RECOLLECTION (A)  Final   Report Status 03/25/2016 FINAL  Final  Blood culture (routine x 2)     Status: None   Collection Time: 03/24/16  6:59 AM  Result Value Ref Range Status   Specimen Description BLOOD LEFT FOREARM  Final   Special Requests BOTTLES DRAWN AEROBIC AND ANAEROBIC 5ML  Final   Culture   Final    NO GROWTH 5 DAYS Performed at [facility], [place] [address], [place], [place] [address]    Report Status 03/29/2016 FINAL  Final  Anaerobic culture     Status: None   Collection Time: 03/24/16  7:20 AM  Result Value Ref Range Status   Specimen Description KNEE LEFT  Final   Special Requests NONE  Final   Culture   Final    NO ANAEROBES ISOLATED  Performed at [facility], [place] [address] [place], [place] [address]    Report Status 03/29/2016 FINAL  Final  Body fluid culture     Status: None   Collection Time: 03/24/16  7:30 AM  Result Value Ref Range Status   Specimen Description KNEE LEFT  Final   Special Requests NONE  Final   Gram Stain   Final    WBC PRESENT,BOTH PMN AND MONONUCLEAR NO ORGANISMS SEEN CYTOSPIN SMEAR Gram Stain Report Called to,Read Back By and Verified With: ROSSER,M. RN @0859  ON 2.5.18 BY [facility]    Culture   Final    RARE STREPTOCOCCUS GROUP G Results Called to: RN Lollie Marrow [id number] MLM Performed at [facility], [place] [address], [place], [place] [address]    Report Status 03/28/2016 FINAL  Final   Organism ID, Bacteria STREPTOCOCCUS GROUP G  Final      Susceptibility   Streptococcus group g - MIC*    CLINDAMYCIN >=1 RESISTANT Resistant     AMPICILLIN <=0.25 SENSITIVE Sensitive     ERYTHROMYCIN >=8 RESISTANT Resistant     VANCOMYCIN 0.5 SENSITIVE Sensitive     CEFTRIAXONE <=0.12 SENSITIVE Sensitive     LEVOFLOXACIN 0.5 SENSITIVE Sensitive     * RARE STREPTOCOCCUS GROUP G  Surgical pcr screen     Status: Abnormal   Collection Time: 03/24/16  2:32 PM  Result Value Ref Range Status   MRSA, PCR POSITIVE (A) NEGATIVE Final    Comment: RESULT CALLED TO, READ BACK BY AND VERIFIED WITH: L.HUDSON RN AT 1950 ON 03/24/16 BY S.VANHOORNE    Staphylococcus aureus POSITIVE (A) NEGATIVE Final    Comment:        The Xpert SA Assay (FDA approved for NASAL specimens in patients over 78 years of age), is one component of a comprehensive surveillance program.  Test performance has been validated by [facility] for patients greater than or equal to 73 year old. It is not intended to diagnose infection nor to guide or monitor treatment.   Aerobic/Anaerobic Culture (surgical/deep wound)     Status: None (Preliminary result)   Collection Time: 03/25/16  4:33 PM  Result Value Ref Range Status   Specimen Description WOUND LEFT KNEE  Final   Special Requests NONE  Final   Gram Stain   Final    RARE WBC PRESENT, PREDOMINANTLY PMN NO ORGANISMS SEEN    Culture   Final    NO GROWTH 4 DAYS Performed at [facility], [place] [address] [place], [place] [address]    Report Status PENDING  Incomplete     Radiological Exams on Admission: No results found.  EKG: Independently reviewed.  Assessment/Plan Principal Problem:   Septic arthritis of knee, left (HCC) Active Problems:   Hypertension   Streptococcal bacteremia   Bladder outflow obstruction   Pyogenic arthritis of left knee joint  ([place])    1. Septic arthritis of knee, left - group G strep with group G strep bacteremia on 2/5 1. Re-tap done by Dr. Tonita Cong 2. Sounds like plan is for re-washout in AM 3. I also spoke with Dr. Megan Salon who recommended just putting patient back on rocephin for tonight.  Does not feel that other ABx (ie vanc) is needed at this point. 4. Giving 2nd L IVF bolus then 125 cc/hr 5. Repeat CBC and BMP in AM 6. Will get repeat BCx, however, he has already gotten the dose of levaquin. 7. X ray of  knee 8. [place] of L leg to r/o DVT 2. Bladder outflow obstruction - 1. Foley in place 2. UA pending 3. HTN -  1. Holding BP meds for borderline hypotension   DVT prophylaxis: Heparin [place] Code Status: Full Family Communication: Wife at bedside Consults called: Dr. Tonita Cong and Dr. Megan Salon Admission status: Admit to inpatient   Etta Quill DO [facility] Pager [phone] from 7PM-7AM  If 7AM-7PM, please contact the day physician for the patient [email or web address] Password [facility]  03/29/2016, 10:29 PM

## 2016-03-29 NOTE — Progress Notes (Signed)
Pharmacy Antibiotic Note  Zachary Mcdonald is a 76 y.o. male s/p knee lavage for septic arthritis on 2/7, on Levaquin at home now with pain and fever admitted on 03/29/2016 with septic arthritis of knee.  Pharmacy has been consulted for Rocephin dosing.  Plan: Rocephin 2 Gm IV q24h Rx will sign off as no further adjustments   Height: 6' (182.9 cm) Weight: 210 lb (95.3 kg) IBW/kg (Calculated) : 77.6  Temp (24hrs), Avg:99.7 F (37.6 C), Min:99.6 F (37.6 C), Max:99.8 F (37.7 C)   Recent Labs Lab 03/24/16 0710  03/24/16 1256  03/25/16 0528 03/25/16 1431  03/25/16 1905 03/26/16 0553 03/26/16 1131 03/26/16 1721 03/27/16 0541 03/28/16 0533 03/29/16 2022 03/29/16 2031  WBC  --   --   --   < > 15.8*  --   --  12.1* 11.2*  --  9.3  --   --  12.3*  --   CREATININE  --   < >  --   < > 2.59*  --   < > 3.11* 3.77*  --   --  1.97* 1.23 1.39*  --   LATICACIDVEN 2.10*  --  1.0  --   --   --   --   --   --   --   --   --   --   --  1.12  VANCOTROUGH  --   --   --   --   --  35*  --   --   --   --   --   --   --   --   --   VANCORANDOM  --   --   --   --   --   --   --   --   --  21  --   --   --   --   --   < > = values in this interval not displayed.  Estimated Creatinine Clearance: 55 mL/min (by C-G formula based on SCr of 1.39 mg/dL (H)).    Allergies  Allergen Reactions  . Oxycontin [Oxycodone Hcl] Nausea And Vomiting and Other (See Comments)    Dizziness   . Valsartan-Hydrochlorothiazide Diarrhea    Antimicrobials this admission: 2/10 rocephin >>    >>   Dose adjustments this admission:   Microbiology results:  BCx:   UCx:    Sputum:    MRSA PCR:   Thank you for allowing pharmacy to be a part of this patient's care.  Dorrene German 03/29/2016 10:08 PM

## 2016-03-29 NOTE — ED Notes (Signed)
Spoke with Dr Tonita Cong As requested to update with pts condition and VS, if labs are abnormal to a degree he will be updated.

## 2016-03-29 NOTE — Consult Note (Signed)
Reason for Consult:Left knee pain Referring Physician: EDP  Zachary Mcdonald is an 75 y.o. male.  HPI: S/P knee lavage for septic arthritis on Wednesday. Home on levaquin. Did not have today. Fever pain at home.  Past Medical History:  Diagnosis Date  . Arthritis    spine and hands  . Borderline diabetes   . Colon polyps    Tubular Adenoma   . GERD (gastroesophageal reflux disease)   . Gout   . Helicobacter pylori gastritis   . Hyperlipidemia   . Hypertension   . Hyperthyroidism     Past Surgical History:  Procedure Laterality Date  . BACK SURGERY  2010  . BICEPS TENDON REPAIR Right 1999  . KNEE ARTHROSCOPY Left 03/25/2016   Procedure: ARTHROSCOPIC WASHOUT OF LEFT KNEE;  Surgeon: Zachary Gioffre, MD;  Location: [facility];  Service: Orthopedics;  Laterality: Left;  . MOLE REMOVAL     face  . VASECTOMY  1978    Family History  Problem Relation Age of Onset  . Diabetes Mother   . Colon cancer Brother     60's  . Diabetes Brother     Social History:  reports that he has never smoked. He has never used smokeless tobacco. He reports that he does not drink alcohol or use drugs.  Allergies:  Allergies  Allergen Reactions  . Oxycontin [Oxycodone Hcl] Nausea And Vomiting and Other (See Comments)    Dizziness   . Valsartan-Hydrochlorothiazide Diarrhea    Medications: I have reviewed the patient's current medications.  Results for orders placed or performed during the hospital encounter of 03/29/16 (from the past 48 hour(s))  Comprehensive metabolic panel     Status: Abnormal   Collection Time: 03/29/16  8:22 PM  Result Value Ref Range   Sodium 140 135 - 145 mmol/L   Potassium 4.0 3.5 - 5.1 mmol/L   Chloride 109 101 - 111 mmol/L   CO2 24 22 - 32 mmol/L   Glucose, Bld 145 (H) 65 - 99 mg/dL   BUN 30 (H) 6 - 20 mg/dL   Creatinine, Ser 1.39 (H) 0.61 - 1.24 mg/dL   Calcium 8.7 (L) 8.9 - 10.3 mg/dL   Total Protein 6.4 (L) 6.5 - 8.1 g/dL   Albumin 2.8 (L) 3.5 - 5.0 g/dL    AST 66 (H) 15 - 41 U/L   ALT 56 17 - 63 U/L   Alkaline Phosphatase 87 38 - 126 U/L   Total Bilirubin 1.1 0.3 - 1.2 mg/dL   GFR calc non Af Amer 48 (L) >60 mL/min   GFR calc Af Amer 56 (L) >60 mL/min    Comment: (NOTE) The eGFR has been calculated using the CKD EPI equation. This calculation has not been validated in all clinical situations. eGFR's persistently <60 mL/min signify possible Chronic Kidney Disease.    Anion gap 7 5 - 15  CBC WITH DIFFERENTIAL     Status: Abnormal   Collection Time: 03/29/16  8:22 PM  Result Value Ref Range   WBC 12.3 (H) 4.0 - 10.5 K/uL   RBC 3.44 (L) 4.22 - 5.81 MIL/uL   Hemoglobin 10.7 (L) 13.0 - 17.0 g/dL   HCT 31.5 (L) 39.0 - 52.0 %   MCV 91.6 78.0 - 100.0 fL   MCH 31.1 26.0 - 34.0 pg   MCHC 34.0 30.0 - 36.0 g/dL   RDW 14.4 11.5 - 15.5 %   Platelets 186 150 - 400 K/uL   Neutrophils Relative % 79 %     Neutro Abs 9.7 (H) 1.7 - 7.7 K/uL   Lymphocytes Relative 13 %   Lymphs Abs 1.6 0.7 - 4.0 K/uL   Monocytes Relative 8 %   Monocytes Absolute 0.9 0.1 - 1.0 K/uL   Eosinophils Relative 0 %   Eosinophils Absolute 0.0 0.0 - 0.7 K/uL   Basophils Relative 0 %   Basophils Absolute 0.0 0.0 - 0.1 K/uL  I-Stat CG4 Lactic Acid, ED  (not at  [facility])     Status: None   Collection Time: 03/29/16  8:31 PM  Result Value Ref Range   Lactic Acid, Venous 1.12 0.5 - 1.9 mmol/L    No results found.  Review of Systems  Constitutional: Positive for fever and malaise/fatigue.  HENT: Negative.   Eyes: Negative.   Respiratory: Negative.   Cardiovascular: Negative.   Gastrointestinal: Negative.   Genitourinary: Negative.   Musculoskeletal: Positive for joint pain.  Skin: Negative.   Neurological: Negative.   Endo/Heme/Allergies: Negative.   Psychiatric/Behavioral: Negative.    Blood pressure (!) 106/48, pulse 64, temperature 99.8 F (37.7 C), temperature source Rectal, resp. rate 18, height 6' (1.829 m), weight 95.3 kg (210 lb), SpO2 96 %. Physical Exam   Constitutional: He is oriented to person, place, and time. He appears well-developed.  HENT:  Head: Normocephalic.  Eyes: Pupils are equal, round, and reactive to light.  Neck: Normal range of motion.  Cardiovascular: Normal rate.   Respiratory: Effort normal.  GI: Soft.  Musculoskeletal: He exhibits edema.  Left knee effusion. Left leg edema. Pain with ROM left knee. No erythema. No [place].  Neurological: He is alert and oriented to person, place, and time.  Skin: Skin is warm and dry.  Psychiatric: He has a normal mood and affect.    Assessment/Plan:  S/P knee I and D with pain swelling possible early sepsis. Plan Aspiration. Sterile Prep left knee. Aspirated 60 cc of serocloudy Fluid. Send for gm stain cultures and cell count. Also hx of gout. Check crystals. Recommend consult ID as he recurred on levaquin. Spoke with Hospitalist. IV hydration. Possible repeat I & D in AM.    Zachary Mcdonald C 03/29/2016, 9:58 PM     

## 2016-03-29 NOTE — ED Notes (Signed)
Bed: [id number] Expected date:  Expected time:  Means of arrival:  Comments: Hold for [facility]

## 2016-03-29 NOTE — ED Provider Notes (Signed)
[facility] DEPT Provider Note   CSN: [id number] Arrival date & time: 03/29/16  1830     History   Chief Complaint No chief complaint on file.   HPI Zachary Mcdonald is a 76 y.o. male who presents with L knee pain. PMH significant for recent admission for pyogenic arthritis of left knee complicated by AKI due to bladder outlet obstruction (Foley was placed while in hospital). Wife is at bedside. They state he was discharged from the hospital yesterday. He was supposed to be discharged with Levaquin however was not prescribed this. He was sent home with home health and the nurse evaluated him today and he had a fever of 101.7 and he has had a lot of difficulty getting around in the home. He took 3 Tylenol and they called Dr. Charlestine Night office who advised him to come back to the ED. He states his pain never really got better while he was in the hospital. He was able to bear weight and was evaluated by PT who had him walk down the hall. He had a washout of left knee performed in the hospital by Dr. Gladstone Lighter. Of note he had MRI of lumbar spine which showed myositis of bilateral medial gluteal muscles. Also was evaluated by PT in the hospital who recommended continued PT at home. Unclear if this was ordered or not.   HPI  Past Medical History:  Diagnosis Date  . Arthritis    spine and hands  . Borderline diabetes   . Colon polyps    Tubular Adenoma   . GERD (gastroesophageal reflux disease)   . Gout   . Helicobacter pylori gastritis   . Hyperlipidemia   . Hypertension   . Hyperthyroidism     Patient Active Problem List   Diagnosis Date Noted  . Pyogenic arthritis of left knee joint ([place])   . 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 Streptococcal bacteremia 03/26/2016  . Acute renal insufficiency 03/26/2016  . Normocytic anemia 03/26/2016  . Thrombocytopenia ([place]) 03/26/2016  . Multilevel degenerative disc disease 03/26/2016  . GERD (gastroesophageal reflux disease) 03/26/2016    . Dyslipidemia 03/26/2016  . Hypertension 03/24/2016  . Hypothyroidism 03/24/2016  . Septic arthritis of knee, left ([place]) 03/24/2016    Past Surgical History:  Procedure Laterality Date  . BACK SURGERY  2010  . BICEPS TENDON REPAIR Right 1999  . KNEE ARTHROSCOPY Left 03/25/2016   Procedure: ARTHROSCOPIC WASHOUT OF LEFT KNEE;  Surgeon: Latanya Maudlin, MD;  Location: [facility];  Service: Orthopedics;  Laterality: Left;  . MOLE REMOVAL     face  . VASECTOMY  1978       Home Medications    Prior to Admission medications   Medication Sig Start Date End Date Taking? Authorizing Provider  acetaminophen (TYLENOL) 500 MG tablet Take 1,000 mg by mouth every 6 (six) hours as needed.    Historical Provider, MD  allopurinol (ZYLOPRIM) 300 MG tablet Take 300 mg by mouth daily.    Historical Provider, MD  atorvastatin (LIPITOR) 20 MG tablet Take 1 tablet (20 mg total) by mouth daily at 6 PM. 03/28/16   Debbe Odea, MD  BENICAR HCT 40-25 MG tablet Take 1 tablet by mouth daily.  06/16/15   Historical Provider, MD  bisacodyl (DULCOLAX) 10 MG suppository Place 1 suppository (10 mg total) rectally daily as needed for moderate constipation. 03/27/16   Debbe Odea, MD  bisacodyl (DULCOLAX) 5 MG EC tablet Take 1 tablet (5 mg total) by mouth daily as needed  for moderate constipation. 03/28/16   Debbe Odea, MD  bismuth subsalicylate (PEPTO BISMOL) 262 MG chewable tablet Chew 1 tablet (262 mg total) by mouth 4 (four) times daily -  before meals and at bedtime. Patient taking differently: Chew 262 mg by mouth 3 (three) times daily as needed for indigestion.  12/05/15   Jerene Bears, MD  diltiazem (TIAZAC) 420 MG 24 hr capsule Take 420 mg by mouth daily.    Historical Provider, MD  ferrous sulfate 325 (65 FE) MG tablet Take 1 tablet (325 mg total) by mouth 3 (three) times daily after meals. 03/28/16   Debbe Odea, MD  fish oil-omega-3 fatty acids 1000 MG capsule Take 2 g by mouth daily.    Historical Provider, MD   gabapentin (NEURONTIN) 300 MG capsule Take 300 mg by mouth 3 (three) times daily. 03/17/16   Historical Provider, MD  levofloxacin (LEVAQUIN) 500 MG tablet Take 1 tablet (500 mg total) by mouth daily. 03/27/16   Debbe Odea, MD  levothyroxine (SYNTHROID, LEVOTHROID) 50 MCG tablet Take 50 mcg by mouth daily before breakfast.    Historical Provider, MD  oxyCODONE (OXY IR/ROXICODONE) 5 MG immediate release tablet Take 1-2 tablets (5-10 mg total) by mouth every 4 (four) hours as needed for severe pain. 03/28/16   Amber Cecilio Asper, PA-C  polyethylene glycol (MIRALAX / GLYCOLAX) packet Take 17 g by mouth daily as needed for mild constipation. 03/28/16   Debbe Odea, MD  senna (SENOKOT) 8.6 MG TABS tablet Take 1 tablet (8.6 mg total) by mouth 2 (two) times daily. 03/28/16   Debbe Odea, MD  tamsulosin (FLOMAX) 0.4 MG CAPS capsule Take 0.4 mg by mouth daily.  04/05/15   Historical Provider, MD    Family History Family History  Problem Relation Age of Onset  . Diabetes Mother   . Colon cancer Brother     58's  . Diabetes Brother     Social History Social History  Substance Use Topics  . Smoking status: Never Smoker  . Smokeless tobacco: Never Used  . Alcohol use No     Allergies   Oxycontin [oxycodone hcl] and Valsartan-hydrochlorothiazide   Review of Systems Review of Systems  Constitutional: Positive for chills and fever.  Musculoskeletal: Positive for arthralgias, gait problem and joint swelling.  Skin: Negative for wound.     Physical Exam Updated Vital Signs BP (!) 106/48 (BP Location: Left Arm)   Pulse 64   Temp 99.6 F (37.6 C) (Oral)   Resp 18   Ht 6' (1.829 m)   Wt 95.3 kg   SpO2 96%   BMI 28.48 kg/m   Physical Exam  Constitutional: He is oriented to person, place, and time. He appears well-developed and well-nourished. No distress.  Uncomfortable appearing  HENT:  Head: Normocephalic and atraumatic.  Eyes: Conjunctivae are normal. Pupils are equal, round, and  reactive to light. Right eye exhibits no discharge. Left eye exhibits no discharge. No scleral icterus.  Neck: Normal range of motion.  Cardiovascular: Normal rate.   Pulmonary/Chest: Effort normal. No respiratory distress.  Abdominal: He exhibits no distension.  Musculoskeletal:  Left knee: Significant swelling and warmth. Diffuse tenderness to palpation. ROM deferred. N/V intact.   Neurological: He is alert and oriented to person, place, and time.  Skin: Skin is warm and dry.  Psychiatric: He has a normal mood and affect. His behavior is normal.  Nursing note and vitals reviewed.    ED Treatments / Results  Labs (all labs ordered are  listed, but only abnormal results are displayed) Labs Reviewed  COMPREHENSIVE METABOLIC PANEL - Abnormal; Notable for the following:       Result Value   Glucose, Bld 145 (*)    BUN 30 (*)    Creatinine, Ser 1.39 (*)    Calcium 8.7 (*)    Total Protein 6.4 (*)    Albumin 2.8 (*)    AST 66 (*)    GFR calc non Af Amer 48 (*)    GFR calc Af Amer 56 (*)    All other components within normal limits  CBC WITH DIFFERENTIAL/PLATELET - Abnormal; Notable for the following:    WBC 12.3 (*)    RBC 3.44 (*)    Hemoglobin 10.7 (*)    HCT 31.5 (*)    Neutro Abs 9.7 (*)    All other components within normal limits  CBG MONITORING, ED - Abnormal; Notable for the following:    Glucose-Capillary 129 (*)    All other components within normal limits  CULTURE, BLOOD (ROUTINE X 2)  CULTURE, BLOOD (ROUTINE X 2)  BODY FLUID CULTURE  GRAM STAIN  BASIC METABOLIC PANEL  SYNOVIAL CELL COUNT + DIFF, W/ CRYSTALS  GLUCOSE, SYNOVIAL FLUID  URINALYSIS, ROUTINE W REFLEX MICROSCOPIC  CBC  I-STAT CG4 LACTIC ACID, ED  I-STAT CG4 LACTIC ACID, ED    EKG  EKG Interpretation None      Radiology No results found.  Procedures Procedures (including critical care time)  Medications Ordered in ED Medications  levofloxacin (LEVAQUIN) IVPB 500 mg (500 mg Intravenous  New Bag/Given 03/29/16 2019)  sodium chloride 0.9 % bolus 1,000 mL (1,000 mLs Intravenous New Bag/Given 03/29/16 2012)    Initial Impression / Assessment and Plan / ED Course  I have reviewed the triage vital signs and the nursing notes.  Pertinent labs & imaging results that were available during my care of the patient were reviewed by me and considered in my medical decision making (see chart for details).  76 year old male with left knee pain due to septic joint from unclear source. Concern for worsening infection since he is still febrile. BP is soft. Fluids and IV Levaquin ordered. Lactic acid is normal. Spoke with Dr. Tonita Cong with ortho who will see patient. Spoke with Dr. Alcario Drought who will admit   Final Clinical Impressions(s) / ED Diagnoses   Final diagnoses:  Pyogenic arthritis of left knee joint, due to unspecified organism [facility])    New Prescriptions New Prescriptions   No medications on file     Recardo Evangelist, PA-C 03/29/16 2222    Virgel Manifold, MD 04/07/16 [phone]

## 2016-03-29 NOTE — ED Triage Notes (Signed)
Pt had arthroscopic knee wash of the left knee. Pt still having persistent knee pain, warmth and swelling.Temp today 101.7, tylenol at 1600 for the same. Dr. Maxie Better sent pt here for further eval.

## 2016-03-30 ENCOUNTER — Inpatient Hospital Stay (HOSPITAL_COMMUNITY): Payer: Medicare Other

## 2016-03-30 ENCOUNTER — Inpatient Hospital Stay (HOSPITAL_COMMUNITY): Payer: Medicare Other | Admitting: Anesthesiology

## 2016-03-30 ENCOUNTER — Encounter (HOSPITAL_COMMUNITY)
Admission: EM | Disposition: A | Discharge status: Home / Self Care | Payer: Self-pay | Source: Home / Self Care | Attending: Internal Medicine

## 2016-03-30 DIAGNOSIS — M00262 Other streptococcal arthritis, left knee: Secondary | ICD-10-CM

## 2016-03-30 DIAGNOSIS — Z888 Allergy status to other drugs, medicaments and biological substances status: Secondary | ICD-10-CM

## 2016-03-30 DIAGNOSIS — B954 Other streptococcus as the cause of diseases classified elsewhere: Secondary | ICD-10-CM

## 2016-03-30 DIAGNOSIS — M10062 Idiopathic gout, left knee: Secondary | ICD-10-CM

## 2016-03-30 DIAGNOSIS — Z9889 Other specified postprocedural states: Secondary | ICD-10-CM

## 2016-03-30 DIAGNOSIS — M109 Gout, unspecified: Secondary | ICD-10-CM | POA: Diagnosis present

## 2016-03-30 DIAGNOSIS — Z885 Allergy status to narcotic agent status: Secondary | ICD-10-CM

## 2016-03-30 DIAGNOSIS — Z800 Family history of malignant neoplasm of digestive organs: Secondary | ICD-10-CM

## 2016-03-30 DIAGNOSIS — M7989 Other specified soft tissue disorders: Secondary | ICD-10-CM

## 2016-03-30 DIAGNOSIS — I10 Essential (primary) hypertension: Secondary | ICD-10-CM

## 2016-03-30 DIAGNOSIS — Z79899 Other long term (current) drug therapy: Secondary | ICD-10-CM

## 2016-03-30 DIAGNOSIS — Z9689 Presence of other specified functional implants: Secondary | ICD-10-CM

## 2016-03-30 DIAGNOSIS — N320 Bladder-neck obstruction: Secondary | ICD-10-CM

## 2016-03-30 DIAGNOSIS — Z833 Family history of diabetes mellitus: Secondary | ICD-10-CM

## 2016-03-30 DIAGNOSIS — M1000 Idiopathic gout, unspecified site: Secondary | ICD-10-CM

## 2016-03-30 HISTORY — PX: KNEE ARTHROSCOPY: SHX127

## 2016-03-30 LAB — BASIC METABOLIC PANEL
Anion gap: 9 (ref 5–15)
BUN: 27 mg/dL — ABNORMAL HIGH (ref 6–20)
CALCIUM: 8.2 mg/dL — AB (ref 8.9–10.3)
CO2: 23 mmol/L (ref 22–32)
CREATININE: 1.17 mg/dL (ref 0.61–1.24)
Chloride: 110 mmol/L (ref 101–111)
GFR calc Af Amer: >60 mL/min (ref >60)
GFR calc non Af Amer: 59 mL/min — ABNORMAL LOW (ref >60)
Glucose, Bld: 135 mg/dL — ABNORMAL HIGH (ref 65–99)
Potassium: 4 mmol/L (ref 3.5–5.1)
Sodium: 142 mmol/L (ref 135–145)

## 2016-03-30 LAB — GLUCOSE, SYNOVIAL FLUID: GLUCOSE, SYNOVIAL FLUID: 74 mg/dL

## 2016-03-30 LAB — CBC
HCT: 29.6 % — ABNORMAL LOW (ref 39.0–52.0)
HEMOGLOBIN: 10 g/dL — AB (ref 13.0–17.0)
MCH: 31.5 pg (ref 26.0–34.0)
MCHC: 33.8 g/dL (ref 30.0–36.0)
MCV: 93.4 fL (ref 78.0–100.0)
PLATELETS: 161 10*3/uL (ref 150–400)
RBC: 3.17 MIL/uL — AB (ref 4.22–5.81)
RDW: 14.6 % (ref 11.5–15.5)
WBC: 10 10*3/uL (ref 4.0–10.5)

## 2016-03-30 SURGERY — ARTHROSCOPY, KNEE
Anesthesia: General | Site: Knee | Laterality: Left

## 2016-03-30 MED ORDER — METHYLPREDNISOLONE SODIUM SUCC 40 MG IJ SOLR
40.0000 mg | Freq: Once | INTRAMUSCULAR | Status: AC
  Administered 2016-03-30: 40 mg via INTRAVENOUS
  Filled 2016-03-30: qty 1

## 2016-03-30 MED ORDER — POLYETHYLENE GLYCOL 3350 17 G PO PACK
17.0000 g | PACK | Freq: Two times a day (BID) | ORAL | Status: DC
  Administered 2016-03-30 – 2016-04-01 (×5): 17 g via ORAL
  Filled 2016-03-30 (×5): qty 1

## 2016-03-30 MED ORDER — HEPARIN (PORCINE) IN NACL 100-0.45 UNIT/ML-% IJ SOLN
1600.0000 [IU]/h | INTRAMUSCULAR | Status: DC
  Administered 2016-03-30: 1600 [IU]/h via INTRAVENOUS
  Filled 2016-03-30 (×2): qty 250

## 2016-03-30 MED ORDER — BISACODYL 10 MG RE SUPP
10.0000 mg | Freq: Once | RECTAL | Status: DC
  Filled 2016-03-30: qty 1

## 2016-03-30 MED ORDER — HEPARIN BOLUS VIA INFUSION
2700.0000 [IU] | INTRAVENOUS | Status: AC
  Administered 2016-03-30: 2700 [IU] via INTRAVENOUS
  Filled 2016-03-30: qty 2700

## 2016-03-30 MED ORDER — PROPOFOL 10 MG/ML IV BOLUS
INTRAVENOUS | Status: AC
  Filled 2016-03-30: qty 20

## 2016-03-30 MED ORDER — METOCLOPRAMIDE HCL 5 MG/ML IJ SOLN: 10.0000 mg | Freq: Once | INTRAMUSCULAR | Status: DC | PRN

## 2016-03-30 MED ORDER — FENTANYL CITRATE (PF) 100 MCG/2ML IJ SOLN
INTRAMUSCULAR | Status: AC
  Filled 2016-03-30: qty 2

## 2016-03-30 MED ORDER — DEXAMETHASONE SODIUM PHOSPHATE 10 MG/ML IJ SOLN
INTRAMUSCULAR | Status: DC | PRN
  Administered 2016-03-30: 10 mg via INTRAVENOUS

## 2016-03-30 MED ORDER — SODIUM CHLORIDE 0.9 % IR SOLN
Status: DC | PRN
  Administered 2016-03-30 (×3): 3000 mL

## 2016-03-30 MED ORDER — STERILE WATER FOR IRRIGATION IR SOLN
Status: DC | PRN
  Administered 2016-03-30: 1000 mL

## 2016-03-30 MED ORDER — PHENYLEPHRINE 40 MCG/ML (10ML) SYRINGE FOR IV PUSH (FOR BLOOD PRESSURE SUPPORT)
PREFILLED_SYRINGE | INTRAVENOUS | Status: DC | PRN
  Administered 2016-03-30: 80 ug via INTRAVENOUS

## 2016-03-30 MED ORDER — MEPERIDINE HCL 50 MG/ML IJ SOLN
6.2500 mg | INTRAMUSCULAR | Status: DC | PRN
Start: 2016-03-30 — End: 2016-03-30

## 2016-03-30 MED ORDER — FENTANYL CITRATE (PF) 100 MCG/2ML IJ SOLN
INTRAMUSCULAR | Status: DC | PRN
  Administered 2016-03-30 (×2): 50 ug via INTRAVENOUS

## 2016-03-30 MED ORDER — PROPOFOL 10 MG/ML IV BOLUS
INTRAVENOUS | Status: DC | PRN
  Administered 2016-03-30: 150 mg via INTRAVENOUS

## 2016-03-30 MED ORDER — LIDOCAINE 2% (20 MG/ML) 5 ML SYRINGE
INTRAMUSCULAR | Status: DC | PRN
  Administered 2016-03-30: 100 mg via INTRAVENOUS

## 2016-03-30 MED ORDER — HEPARIN SODIUM (PORCINE) 5000 UNIT/ML IJ SOLN
5000.0000 [IU] | Freq: Three times a day (TID) | INTRAMUSCULAR | Status: DC
  Administered 2016-03-30: 5000 [IU] via SUBCUTANEOUS
  Filled 2016-03-30: qty 1

## 2016-03-30 MED ORDER — ONDANSETRON HCL 4 MG/2ML IJ SOLN
INTRAMUSCULAR | Status: DC | PRN
Start: 2016-03-30 — End: 2016-03-30
  Administered 2016-03-30: 4 mg via INTRAVENOUS

## 2016-03-30 MED ORDER — LIDOCAINE 2% (20 MG/ML) 5 ML SYRINGE
INTRAMUSCULAR | Status: AC
  Filled 2016-03-30: qty 5

## 2016-03-30 MED ORDER — PREDNISONE 20 MG PO TABS
40.0000 mg | ORAL_TABLET | Freq: Every day | ORAL | Status: DC
Start: 2016-03-31 — End: 2016-04-01
  Administered 2016-03-31 – 2016-04-01 (×2): 40 mg via ORAL
  Filled 2016-03-30 (×2): qty 2

## 2016-03-30 MED ORDER — DEXAMETHASONE SODIUM PHOSPHATE 10 MG/ML IJ SOLN
INTRAMUSCULAR | Status: AC
  Filled 2016-03-30: qty 1

## 2016-03-30 MED ORDER — ONDANSETRON HCL 4 MG/2ML IJ SOLN
INTRAMUSCULAR | Status: AC
  Filled 2016-03-30: qty 2

## 2016-03-30 MED ORDER — FENTANYL CITRATE (PF) 100 MCG/2ML IJ SOLN: 25.0000 ug | INTRAMUSCULAR | Status: DC | PRN

## 2016-03-30 MED ORDER — PHENYLEPHRINE 40 MCG/ML (10ML) SYRINGE FOR IV PUSH (FOR BLOOD PRESSURE SUPPORT)
PREFILLED_SYRINGE | INTRAVENOUS | Status: AC
  Filled 2016-03-30: qty 10

## 2016-03-30 SURGICAL SUPPLY — 27 items
BANDAGE ACE 4X5 VEL STRL LF (GAUZE/BANDAGES/DRESSINGS) ×2 IMPLANT
BANDAGE ACE 6X5 VEL STRL LF (GAUZE/BANDAGES/DRESSINGS) ×3 IMPLANT
BLADE 4.2CUDA (BLADE) ×3 IMPLANT
BLADE CUDA SHAVER 3.5 (BLADE) ×3 IMPLANT
BNDG GAUZE ELAST 4 BULKY (GAUZE/BANDAGES/DRESSINGS) ×2 IMPLANT
BOOTIES KNEE HIGH SLOAN (MISCELLANEOUS) ×3 IMPLANT
CLOTH 2% CHLOROHEXIDINE 3PK (PERSONAL CARE ITEMS) ×3 IMPLANT
DRSG EMULSION OIL 3X3 NADH (GAUZE/BANDAGES/DRESSINGS) ×3 IMPLANT
DRSG PAD ABDOMINAL 8X10 ST (GAUZE/BANDAGES/DRESSINGS) ×3 IMPLANT
DURAPREP 26ML APPLICATOR (WOUND CARE) ×3 IMPLANT
GAUZE SPONGE 4X4 12PLY STRL (GAUZE/BANDAGES/DRESSINGS) ×3 IMPLANT
GLOVE BIOGEL PI IND STRL 7.0 (GLOVE) IMPLANT
GLOVE BIOGEL PI INDICATOR 7.0 (GLOVE)
GLOVE SURG SS PI 7.0 STRL IVOR (GLOVE) ×3 IMPLANT
GLOVE SURG SS PI 7.5 STRL IVOR (GLOVE) IMPLANT
GLOVE SURG SS PI 8.0 STRL IVOR (GLOVE) ×3 IMPLANT
GOWN STRL REUS W/TWL XL LVL3 (GOWN DISPOSABLE) ×3 IMPLANT
KIT BASIN OR (CUSTOM PROCEDURE TRAY) ×3 IMPLANT
MANIFOLD NEPTUNE II (INSTRUMENTS) ×3 IMPLANT
PACK ARTHROSCOPY WL (CUSTOM PROCEDURE TRAY) ×3 IMPLANT
PADDING CAST COTTON 6X4 STRL (CAST SUPPLIES) ×3 IMPLANT
SUT ETHILON 4 0 PS 2 18 (SUTURE) ×3 IMPLANT
TOWEL OR 17X26 10 PK STRL BLUE (TOWEL DISPOSABLE) ×3 IMPLANT
TUBING ARTHRO INFLOW-ONLY STRL (TUBING) ×3 IMPLANT
WAND HAND CNTRL MULTIVAC 50 (MISCELLANEOUS) ×3 IMPLANT
WAND HAND CNTRL MULTIVAC 90 (MISCELLANEOUS) IMPLANT
WRAP KNEE MAXI GEL POST OP (GAUZE/BANDAGES/DRESSINGS) ×3 IMPLANT

## 2016-03-30 NOTE — H&P (View-Only) (Signed)
Reason for Consult:Left knee pain Referring Physician: EDP  Zachary Mcdonald is an 76 y.o. male.  HPI: S/P knee lavage for septic arthritis on Wednesday. Home on levaquin. Did not have today. Fever pain at home.  Past Medical History:  Diagnosis Date  . Arthritis    spine and hands  . Borderline diabetes   . Colon polyps    Tubular Adenoma   . GERD (gastroesophageal reflux disease)   . Gout   . Helicobacter pylori gastritis   . Hyperlipidemia   . Hypertension   . Hyperthyroidism     Past Surgical History:  Procedure Laterality Date  . BACK SURGERY  2010  . BICEPS TENDON REPAIR Right 1999  . KNEE ARTHROSCOPY Left 03/25/2016   Procedure: ARTHROSCOPIC WASHOUT OF LEFT KNEE;  Surgeon: Latanya Maudlin, MD;  Location: [facility];  Service: Orthopedics;  Laterality: Left;  . MOLE REMOVAL     face  . VASECTOMY  1978    Family History  Problem Relation Age of Onset  . Diabetes Mother   . Colon cancer Brother     76's  . Diabetes Brother     Social History:  reports that he has never smoked. He has never used smokeless tobacco. He reports that he does not drink alcohol or use drugs.  Allergies:  Allergies  Allergen Reactions  . Oxycontin [Oxycodone Hcl] Nausea And Vomiting and Other (See Comments)    Dizziness   . Valsartan-Hydrochlorothiazide Diarrhea    Medications: I have reviewed the patient's current medications.  Results for orders placed or performed during the hospital encounter of 03/29/16 (from the past 48 hour(s))  Comprehensive metabolic panel     Status: Abnormal   Collection Time: 03/29/16  8:22 PM  Result Value Ref Range   Sodium 140 135 - 145 mmol/L   Potassium 4.0 3.5 - 5.1 mmol/L   Chloride 109 101 - 111 mmol/L   CO2 24 22 - 32 mmol/L   Glucose, Bld 145 (H) 65 - 99 mg/dL   BUN 30 (H) 6 - 20 mg/dL   Creatinine, Ser 1.39 (H) 0.61 - 1.24 mg/dL   Calcium 8.7 (L) 8.9 - 10.3 mg/dL   Total Protein 6.4 (L) 6.5 - 8.1 g/dL   Albumin 2.8 (L) 3.5 - 5.0 g/dL    AST 66 (H) 15 - 41 U/L   ALT 56 17 - 63 U/L   Alkaline Phosphatase 87 38 - 126 U/L   Total Bilirubin 1.1 0.3 - 1.2 mg/dL   GFR calc non Af Amer 48 (L) >60 mL/min   GFR calc Af Amer 56 (L) >60 mL/min    Comment: (NOTE) The eGFR has been calculated using the CKD EPI equation. This calculation has not been validated in all clinical situations. eGFR's persistently <60 mL/min signify possible Chronic Kidney Disease.    Anion gap 7 5 - 15  CBC WITH DIFFERENTIAL     Status: Abnormal   Collection Time: 03/29/16  8:22 PM  Result Value Ref Range   WBC 12.3 (H) 4.0 - 10.5 K/uL   RBC 3.44 (L) 4.22 - 5.81 MIL/uL   Hemoglobin 10.7 (L) 13.0 - 17.0 g/dL   HCT 31.5 (L) 39.0 - 52.0 %   MCV 91.6 78.0 - 100.0 fL   MCH 31.1 26.0 - 34.0 pg   MCHC 34.0 30.0 - 36.0 g/dL   RDW 14.4 11.5 - 15.5 %   Platelets 186 150 - 400 K/uL   Neutrophils Relative % 79 %  Neutro Abs 9.7 (H) 1.7 - 7.7 K/uL   Lymphocytes Relative 13 %   Lymphs Abs 1.6 0.7 - 4.0 K/uL   Monocytes Relative 8 %   Monocytes Absolute 0.9 0.1 - 1.0 K/uL   Eosinophils Relative 0 %   Eosinophils Absolute 0.0 0.0 - 0.7 K/uL   Basophils Relative 0 %   Basophils Absolute 0.0 0.0 - 0.1 K/uL  I-Stat CG4 Lactic Acid, ED  (not at  [facility])     Status: None   Collection Time: 03/29/16  8:31 PM  Result Value Ref Range   Lactic Acid, Venous 1.12 0.5 - 1.9 mmol/L    No results found.  Review of Systems  Constitutional: Positive for fever and malaise/fatigue.  HENT: Negative.   Eyes: Negative.   Respiratory: Negative.   Cardiovascular: Negative.   Gastrointestinal: Negative.   Genitourinary: Negative.   Musculoskeletal: Positive for joint pain.  Skin: Negative.   Neurological: Negative.   Endo/Heme/Allergies: Negative.   Psychiatric/Behavioral: Negative.    Blood pressure (!) 106/48, pulse 64, temperature 99.8 F (37.7 C), temperature source Rectal, resp. rate 18, height 6' (1.829 m), weight 95.3 kg (210 lb), SpO2 96 %. Physical Exam   Constitutional: He is oriented to person, place, and time. He appears well-developed.  HENT:  Head: Normocephalic.  Eyes: Pupils are equal, round, and reactive to light.  Neck: Normal range of motion.  Cardiovascular: Normal rate.   Respiratory: Effort normal.  GI: Soft.  Musculoskeletal: He exhibits edema.  Left knee effusion. Left leg edema. Pain with ROM left knee. No erythema. No [place].  Neurological: He is alert and oriented to person, place, and time.  Skin: Skin is warm and dry.  Psychiatric: He has a normal mood and affect.    Assessment/Plan:  S/P knee I and D with pain swelling possible early sepsis. Plan Aspiration. Sterile Prep left knee. Aspirated 60 cc of serocloudy Fluid. Send for gm stain cultures and cell count. Also hx of gout. Check crystals. Recommend consult ID as he recurred on levaquin. Spoke with Hospitalist. IV hydration. Possible repeat I & D in AM.    [name] C 03/29/2016, 9:58 PM

## 2016-03-30 NOTE — ED Notes (Signed)
Dr. Tonita Cong, Orthopedic at bedside.Assisted with Left knee aspiration.

## 2016-03-30 NOTE — Interval H&P Note (Signed)
History and Physical Interval Note:  03/30/2016 7:08 AM  Zachary Mcdonald  has presented today for surgery, with the diagnosis of left knee infection  The various methods of treatment have been discussed with the patient and family. After consideration of risks, benefits and other options for treatment, the patient has consented to  Procedure(s): ARTHROSCOPY KNEE (Left) as a surgical intervention .  The patient's history has been reviewed, patient examined, no change in status, stable for surgery.  I have reviewed the patient's chart and labs.  Questions were answered to the patient's satisfaction.     Kahlen Morais C

## 2016-03-30 NOTE — Anesthesia Preprocedure Evaluation (Signed)
Anesthesia Evaluation  Patient identified by MRN, date of birth, ID band Patient awake    Reviewed: Allergy & Precautions, H&P , NPO status , Patient's Chart, lab work & pertinent test results  Airway Mallampati: II  TM Distance: >3 FB Neck ROM: Full    Dental no notable dental hx. (+) Teeth Intact, Dental Advisory Given   Pulmonary neg pulmonary ROS,    Pulmonary exam normal breath sounds clear to auscultation       Cardiovascular hypertension, Pt. on medications  Rhythm:Regular Rate:Normal     Neuro/Psych negative neurological ROS  negative psych ROS   GI/Hepatic Neg liver ROS, GERD  Medicated and Controlled,  Endo/Other  Hypothyroidism   Renal/GU Renal InsufficiencyRenal disease  negative genitourinary   Musculoskeletal  (+) Arthritis , Osteoarthritis,    Abdominal   Peds  Hematology negative hematology ROS (+)   Anesthesia Other Findings   Reproductive/Obstetrics negative OB ROS                             Anesthesia Physical  Anesthesia Plan  ASA: II  Anesthesia Plan: General   Post-op Pain Management:    Induction: Intravenous  Airway Management Planned: LMA  Additional Equipment:   Intra-op Plan:   Post-operative Plan: Extubation in OR  Informed Consent: I have reviewed the patients History and Physical, chart, labs and discussed the procedure including the risks, benefits and alternatives for the proposed anesthesia with the patient or authorized representative who has indicated his/her understanding and acceptance.   Dental advisory given  Plan Discussed with: CRNA and Surgeon  Anesthesia Plan Comments:         Anesthesia Quick Evaluation

## 2016-03-30 NOTE — Progress Notes (Signed)
Patient ID: Zachary Mcdonald, male   DOB: 12-06-1940, 76 y.o.   MRN: [id number]         [facility] for Infectious Disease  Date of Admission:  03/29/2016    Total days of antibiotics 7                Principal Problem:   Acute gout Active Problems:   Septic arthritis of knee, left (HCC)   Streptococcal bacteremia   Hypertension   Bladder outflow obstruction   . allopurinol  300 mg Oral Daily  . atorvastatin  20 mg Oral q1800  . cefTRIAXone (ROCEPHIN)  IV  2 g Intravenous QHS  . ferrous sulfate  325 mg Oral TID PC  . gabapentin  300 mg Oral TID  . heparin  5,000 Units Subcutaneous Q8H  . levothyroxine  50 mcg Oral QAC breakfast  . omega-3 acid ethyl esters  1 g Oral Daily  . senna  1 tablet Oral BID  . tamsulosin  0.4 mg Oral Daily    SUBJECTIVE: Zachary Mcdonald was discharged yesterday after diagnosis and initial treatment of group G streptococcal bacteremia and left knee septic arthritis. He was having minimal pain in his left knee at the time of discharge. When he went to the pharmacy they did not have his prescription for levofloxacin so he missed his initial dose. By late afternoon yesterday he had sudden onset of severe left knee pain, swelling and recurrent fever leading to readmission. He underwent repeat incision and drainage. Synovial fluid showed 27,610 white blood cells. No organisms were seen on Gram stain. Synovial fluid was positive for monosodium urate crystals. He has a long history of gout and takes allopurinol daily.  Review of Systems: Review of Systems  Constitutional: Positive for chills and fever. Negative for diaphoresis.  Gastrointestinal: Negative for abdominal pain, diarrhea, nausea and vomiting.  Musculoskeletal: Positive for joint pain.    Past Medical History:  Diagnosis Date  . Arthritis    spine and hands  . Borderline diabetes   . Colon polyps    Tubular Adenoma   . GERD (gastroesophageal reflux disease)   . Gout   . Helicobacter pylori  gastritis   . Hyperlipidemia   . Hypertension   . Hyperthyroidism     Social History  Substance Use Topics  . Smoking status: Never Smoker  . Smokeless tobacco: Never Used  . Alcohol use No    Family History  Problem Relation Age of Onset  . Diabetes Mother   . Colon cancer Brother     22's  . Diabetes Brother    Allergies  Allergen Reactions  . Oxycontin [Oxycodone Hcl] Nausea And Vomiting and Other (See Comments)    Dizziness   . Valsartan-Hydrochlorothiazide Diarrhea    OBJECTIVE: Vitals:   03/30/16 0830 03/30/16 0845 03/30/16 0901 03/30/16 0910  BP: 108/65 (!) 113/57 111/67 (!) 126/56  Pulse: 63 64 66   Resp: 12 19 16    Temp:  98.3 F (36.8 C)  98.6 F (37 C)  TempSrc:      SpO2: 96% 96%    Weight:      Height:       Body mass index is 29.54 kg/m.  Physical Exam  Constitutional: He is oriented to person, place, and time.  He is resting comfortably in bed. His wife is present.  Musculoskeletal:  He has an Ace wrap on his left knee. A drain is in place with thin bloody fluid.  Neurological: He  is alert and oriented to person, place, and time.  Skin: No rash noted.  Psychiatric: Mood and affect normal.    Lab Results Lab Results  Component Value Date   WBC 10.0 03/30/2016   HGB 10.0 (L) 03/30/2016   HCT 29.6 (L) 03/30/2016   MCV 93.4 03/30/2016   PLT 161 03/30/2016    Lab Results  Component Value Date   CREATININE 1.17 03/30/2016   BUN 27 (H) 03/30/2016   NA 142 03/30/2016   K 4.0 03/30/2016   CL 110 03/30/2016   CO2 23 03/30/2016    Lab Results  Component Value Date   ALT 56 03/29/2016   AST 66 (H) 03/29/2016   ALKPHOS 87 03/29/2016   BILITOT 1.1 03/29/2016    No results found for: ESRSEDRATE, CRP   Microbiology: Recent Results (from the past 240 hour(s))  Blood culture (routine x 2)     Status: Abnormal   Collection Time: 03/24/16  6:38 AM  Result Value Ref Range Status   Specimen Description BLOOD RIGHT HAND  Final   Special  Requests BOTTLES DRAWN AEROBIC AND ANAEROBIC 5ML  Final   Culture  Setup Time   Final    GRAM POSITIVE COCCI IN CHAINS IN PAIRS ANAEROBIC BOTTLE ONLY CRITICAL RESULT CALLED TO, READ BACK BY AND VERIFIED WITH: BETH GREEN, PHARMD @0050  03/25/16 MKELLY,MLT Performed at [facility], [place] [address], [place], [place] [address]    Culture STREPTOCOCCUS GROUP G (A)  Final   Report Status 03/26/2016 FINAL  Final   Organism ID, Bacteria STREPTOCOCCUS GROUP G  Final      Susceptibility   Streptococcus group g - MIC*    CLINDAMYCIN >=1 RESISTANT Resistant     AMPICILLIN <=0.25 SENSITIVE Sensitive     ERYTHROMYCIN >=8 RESISTANT Resistant     VANCOMYCIN 0.5 SENSITIVE Sensitive     CEFTRIAXONE <=0.12 SENSITIVE Sensitive     LEVOFLOXACIN 0.5 SENSITIVE Sensitive     * STREPTOCOCCUS GROUP G  Blood Culture ID Panel (Reflexed)     Status: Abnormal   Collection Time: 03/24/16  6:38 AM  Result Value Ref Range Status   Enterococcus species NOT DETECTED NOT DETECTED Final   Listeria monocytogenes NOT DETECTED NOT DETECTED Final   Staphylococcus species NOT DETECTED NOT DETECTED Final   Staphylococcus aureus NOT DETECTED NOT DETECTED Final   Streptococcus species DETECTED (A) NOT DETECTED Final    Comment: Not Enterococcus species, Streptococcus agalactiae, Streptococcus pyogenes, or Streptococcus pneumoniae. CRITICAL RESULT CALLED TO, READ BACK BY AND VERIFIED WITH: BETH GREEN, PHARMD @0050  03/25/16 MKELLY,MLT    Streptococcus agalactiae NOT DETECTED NOT DETECTED Final   Streptococcus pneumoniae NOT DETECTED NOT DETECTED Final   Streptococcus pyogenes NOT DETECTED NOT DETECTED Final   Acinetobacter baumannii NOT DETECTED NOT DETECTED Final   Enterobacteriaceae species NOT DETECTED NOT DETECTED Final   Enterobacter cloacae complex NOT DETECTED NOT DETECTED Final   Escherichia coli NOT DETECTED NOT DETECTED Final   Klebsiella oxytoca NOT DETECTED NOT DETECTED Final   Klebsiella pneumoniae NOT  DETECTED NOT DETECTED Final   Proteus species NOT DETECTED NOT DETECTED Final   Serratia marcescens NOT DETECTED NOT DETECTED Final   Haemophilus influenzae NOT DETECTED NOT DETECTED Final   Neisseria meningitidis NOT DETECTED NOT DETECTED Final   Pseudomonas aeruginosa NOT DETECTED NOT DETECTED Final   Candida albicans NOT DETECTED NOT DETECTED Final   Candida glabrata NOT DETECTED NOT DETECTED Final   Candida krusei NOT DETECTED NOT DETECTED Final  Candida parapsilosis NOT DETECTED NOT DETECTED Final   Candida tropicalis NOT DETECTED NOT DETECTED Final    Comment: Performed at [facility], [place] [address] [place], [place] [address]  Urine culture     Status: Abnormal   Collection Time: 03/24/16  6:48 AM  Result Value Ref Range Status   Specimen Description URINE, CLEAN CATCH  Final   Special Requests NONE  Final   Culture MULTIPLE SPECIES PRESENT, SUGGEST RECOLLECTION (A)  Final   Report Status 03/25/2016 FINAL  Final  Blood culture (routine x 2)     Status: None   Collection Time: 03/24/16  6:59 AM  Result Value Ref Range Status   Specimen Description BLOOD LEFT FOREARM  Final   Special Requests BOTTLES DRAWN AEROBIC AND ANAEROBIC 5ML  Final   Culture   Final    NO GROWTH 5 DAYS Performed at [facility], [place] [address] [place], [place] [address]    Report Status 03/29/2016 FINAL  Final  Anaerobic culture     Status: None   Collection Time: 03/24/16  7:20 AM  Result Value Ref Range Status   Specimen Description KNEE LEFT  Final   Special Requests NONE  Final   Culture   Final    NO ANAEROBES ISOLATED Performed at [facility], [place] [address] [place], [place] [address]    Report Status 03/29/2016 FINAL  Final  Body fluid culture     Status: None   Collection Time: 03/24/16  7:30 AM  Result Value Ref Range Status   Specimen Description KNEE LEFT  Final   Special Requests NONE  Final   Gram Stain   Final    WBC PRESENT,BOTH PMN AND MONONUCLEAR NO  ORGANISMS SEEN CYTOSPIN SMEAR Gram Stain Report Called to,Read Back By and Verified With: ROSSER,M. RN @0859  ON 2.5.18 BY [facility]    Culture   Final    RARE STREPTOCOCCUS GROUP G Results Called to: RN Lollie Marrow [id number] MLM Performed at [facility], [place] [address], [place], [place] [address]    Report Status 03/28/2016 FINAL  Final   Organism ID, Bacteria STREPTOCOCCUS GROUP G  Final      Susceptibility   Streptococcus group g - MIC*    CLINDAMYCIN >=1 RESISTANT Resistant     AMPICILLIN <=0.25 SENSITIVE Sensitive     ERYTHROMYCIN >=8 RESISTANT Resistant     VANCOMYCIN 0.5 SENSITIVE Sensitive     CEFTRIAXONE <=0.12 SENSITIVE Sensitive     LEVOFLOXACIN 0.5 SENSITIVE Sensitive     * RARE STREPTOCOCCUS GROUP G  Surgical pcr screen     Status: Abnormal   Collection Time: 03/24/16  2:32 PM  Result Value Ref Range Status   MRSA, PCR POSITIVE (A) NEGATIVE Final    Comment: RESULT CALLED TO, READ BACK BY AND VERIFIED WITH: L.HUDSON RN AT 1950 ON 03/24/16 BY S.VANHOORNE    Staphylococcus aureus POSITIVE (A) NEGATIVE Final    Comment:        The Xpert SA Assay (FDA approved for NASAL specimens in patients over 25 years of age), is one component of a comprehensive surveillance program.  Test performance has been validated by [facility] for patients greater than or equal to 63 year old. It is not intended to diagnose infection nor to guide or monitor treatment.   Aerobic/Anaerobic Culture (surgical/deep wound)     Status: None (Preliminary result)   Collection Time: 03/25/16  4:33 PM  Result Value  Ref Range Status   Specimen Description WOUND LEFT KNEE  Final   Special Requests NONE  Final   Gram Stain   Final    RARE WBC PRESENT, PREDOMINANTLY PMN NO ORGANISMS SEEN    Culture   Final    NO GROWTH 4 DAYS Performed at [facility], [address], [place], [place] [address]    Report Status PENDING  Incomplete  Body fluid culture     Status: None (Preliminary  result)   Collection Time: 03/29/16  9:58 PM  Result Value Ref Range Status   Specimen Description KNEE LEFT  Final   Special Requests Normal  Final   Gram Stain   Final    WBC PRESENT,BOTH PMN AND MONONUCLEAR NO ORGANISMS SEEN Gram Stain Report Called to,Read Back By and Verified With: B.JESSEE AT 2309 BY W.SHEA ON 03/29/16    Culture PENDING  Incomplete   Report Status PENDING  Incomplete     ASSESSMENT: He has developed an acute flare of gout superimposed upon his recently diagnosed group G streptococcal septic arthritis. I agree with continuing IV ceftriaxone while here than converting to oral levofloxacin upon discharge. I believe it is okay to manage his gout with either NSAIDs or prednisone even though he has infection. That should not cause any difficulty managing the infection.  PLAN: 1. Continue ceftriaxone for now 2. I will follow up in the morning  Michel Bickers, MD [facility] for [place] [phone] pager   [phone] cell 03/30/2016, 10:12 AM

## 2016-03-30 NOTE — Brief Op Note (Signed)
03/29/2016 - 03/30/2016  8:12 AM  PATIENT:  Birdena Jubilee  76 y.o. male  PRE-OPERATIVE DIAGNOSIS:  left knee infection  POST-OPERATIVE DIAGNOSIS:  left knee infection, septic arthritis  PROCEDURE:  Procedure(s): ARTHROSCOPY KNEE (Left)  SURGEON:  Surgeon(s) and Role:    * Susa Day, MD - Primary  PHYSICIAN ASSISTANT:   ASSISTANTS: none   ANESTHESIA:   general  EBL:  Total I/O In: 1000 [I.V.:1000] Out: -   BLOOD ADMINISTERED:none  DRAINS: none   LOCAL MEDICATIONS USED:  NONE  SPECIMEN:  No Specimen  DISPOSITION OF SPECIMEN:  N/A  COUNTS:  YES  TOURNIQUET:  * No tourniquets in log *  DICTATION: .Other Dictation: Dictation Number [id number]  PLAN OF CARE: Admit to inpatient   PATIENT DISPOSITION:  PACU - hemodynamically stable.   Delay start of Pharmacological VTE agent (>24hrs) due to surgical blood loss or risk of bleeding: no

## 2016-03-30 NOTE — Progress Notes (Signed)
PT Cancellation Note  Patient Details Name: Zachary Mcdonald MRN: [id number] DOB: 1940-04-29   Cancelled Treatment:    Reason Eval/Treat Not Completed: Patient at procedure or test/unavailable (in surgery for repeat I&D)   [facility] 03/30/2016, 8:58 AM

## 2016-03-30 NOTE — Anesthesia Postprocedure Evaluation (Signed)
Anesthesia Post Note  Patient: Zachary Mcdonald  Procedure(s) Performed: Procedure(s) (LRB): ARTHROSCOPY KNEE (Left)  Patient location during evaluation: PACU Anesthesia Type: General Level of consciousness: awake and alert Pain management: pain level controlled Vital Signs Assessment: post-procedure vital signs reviewed and stable Respiratory status: spontaneous breathing, nonlabored ventilation, respiratory function stable and patient connected to nasal cannula oxygen Cardiovascular status: blood pressure returned to baseline and stable Postop Assessment: no signs of nausea or vomiting Anesthetic complications: no       Last Vitals:  Vitals:   03/30/16 0901 03/30/16 0910  BP: 111/67 (!) 126/56  Pulse: 66   Resp: 16   Temp:  37 C    Last Pain:  Vitals:   03/29/16 2330  TempSrc: Oral  PainSc: 3                  Montez Hageman

## 2016-03-30 NOTE — Progress Notes (Signed)
ANTICOAGULATION CONSULT NOTE - Initial Consult  Pharmacy Consult for Heparin Indication: DVT  Allergies  Allergen Reactions  . Oxycontin [Oxycodone Hcl] Nausea And Vomiting and Other (See Comments)    Dizziness   . Valsartan-Hydrochlorothiazide Diarrhea    Patient Measurements: Height: 6' (182.9 cm) Weight: 217 lb 13 oz (98.8 kg) IBW/kg (Calculated) : 77.6 Heparin Dosing Weight: 97.5  Vital Signs: Temp: 98.6 F (37 C) (02/11 0910) BP: 125/58 (02/11 1300) Pulse Rate: 56 (02/11 1300)  Labs:  Recent Labs  03/28/16 0533 03/29/16 2022 03/30/16 0338  HGB  --  10.7* 10.0*  HCT  --  31.5* 29.6*  PLT  --  186 161  CREATININE 1.23 1.39* 1.17    Estimated Creatinine Clearance: 66.4 mL/min (by C-G formula based on SCr of 1.17 mg/dL).   Medical History: Past Medical History:  Diagnosis Date  . Arthritis    spine and hands  . Borderline diabetes   . Colon polyps    Tubular Adenoma   . GERD (gastroesophageal reflux disease)   . Gout   . Helicobacter pylori gastritis   . Hyperlipidemia   . Hypertension   . Hyperthyroidism     Medications:  Scheduled:  . allopurinol  300 mg Oral Daily  . atorvastatin  20 mg Oral q1800  . bisacodyl  10 mg Rectal Once  . cefTRIAXone (ROCEPHIN)  IV  2 g Intravenous QHS  . ferrous sulfate  325 mg Oral TID PC  . gabapentin  300 mg Oral TID  . levothyroxine  50 mcg Oral QAC breakfast  . omega-3 acid ethyl esters  1 g Oral Daily  . polyethylene glycol  17 g Oral BID  . [START ON 03/31/2016] predniSONE  40 mg Oral Q breakfast  . senna  1 tablet Oral BID  . tamsulosin  0.4 mg Oral Daily   Infusions:    Assessment: 30 yoM readmitted on 2/10 with acute gout flare superimposed on recent septic arthritis.  He was discharge on 2/9 after admission significant for septic arthritis of knee (s/p aspiration and washout), Streptococcal bacteremia (treated with Ceftriaxone IV then discharged on Levaquin PO), and AKI due to bladder outlet  obstruction which resolved after Foley catheter placement.  Now positive for acute DVT in LLE - pharmacy is consulted to dose Heparin IV.   No PTA anticoagulants.  While inpatient on previous admission, he received Heparin SQ for VTE prophylaxis from 2/5-2/6, then Lovenox (dose adjusted for renal function) on 2/7-2/8.  This admission, he last received Heparin SQ for VTE prophylaxis on 2/11 at 12:00  Today, 03/30/2016: SCr 1.17 CBC: Hgb 10, Plt 161 No bleeding reported.   Goal of Therapy:  Heparin level 0.3-0.7 units/ml Monitor platelets by anticoagulation protocol: Yes   Plan:   Give heparin 2700 units bolus IV x 1  Start heparin IV infusion at 1600 units/hr  Heparin level 8 hours after starting  Daily heparin level and CBC  Continue to monitor for s/s bleeding, thrombosis.  Follow up plans for further procedures or change to Lovenox per MD notes.   Gretta Arab PharmD, BCPS Pager [phone] 03/30/2016,3:03 PM

## 2016-03-30 NOTE — Transfer of Care (Signed)
Immediate Anesthesia Transfer of Care Note  Patient: Zachary Mcdonald  Procedure(s) Performed: Procedure(s): ARTHROSCOPY KNEE (Left)  Patient Location: PACU  Anesthesia Type:General  Level of Consciousness: sedated  Airway & Oxygen Therapy: Patient Spontanous Breathing and Patient connected to face mask oxygen  Post-op Assessment: Report given to RN and Post -op Vital signs reviewed and stable  Post vital signs: Reviewed and stable  Last Vitals:  Vitals:   03/30/16 0200 03/30/16 0300  BP: (!) 112/52 (!) 101/51  Pulse: 61 (!) 57  Resp: 11 19  Temp:      Last Pain:  Vitals:   03/29/16 2330  TempSrc: Oral  PainSc: 3       Patients Stated Pain Goal: 2 ([id number])  Complications: No apparent anesthesia complications

## 2016-03-30 NOTE — Anesthesia Procedure Notes (Signed)
Procedure Name: Intubation Date/Time: 03/30/2016 7:21 AM Performed by: Lind Covert Pre-anesthesia Checklist: Patient identified, Emergency Drugs available, Suction available, Patient being monitored and Timeout performed Patient Re-evaluated:Patient Re-evaluated prior to inductionOxygen Delivery Method: Circle system utilized Preoxygenation: Pre-oxygenation with 100% oxygen Intubation Type: IV induction LMA: LMA with gastric port inserted LMA Size: 5.0 Number of attempts: 1 Placement Confirmation: positive ETCO2 Tube secured with: Tape Dental Injury: Teeth and Oropharynx as per pre-operative assessment

## 2016-03-30 NOTE — Progress Notes (Signed)
*  Preliminary Results* Left lower extremity venous duplex completed. Study was technically limited due to surgical dressing. Left lower extremity is positive for acute deep vein thrombosis involving the left peroneal veins. There is no evidence of left Baker's cyst.  Preliminary results discussed with Dr. Sheran Fava.  03/30/2016 2:26 PM  Maudry Mayhew, BS, RVT, RDCS, RDMS

## 2016-03-30 NOTE — Progress Notes (Signed)
PROGRESS NOTE  Zachary Mcdonald  [id number] DOB: 05-20-1940 DOA: 03/29/2016 PCP: Tivis Ringer, MD  Brief Narrative:   The patient is a 76 year old male with history of hypertension, hyperlipidemia, hypothyroidism, gout, borderline diabetes who was recently admitted for septic arthritis of the left knee with bacteremia due to group G strep.    Assessment & Plan:   Principal Problem:   Acute gout Active Problems:   Hypertension   Septic arthritis of knee, left (HCC)   Streptococcal bacteremia   Bladder outflow obstruction  Septic arthritis of the left knee due to group G strep with group G strep bacteremia diagnosed on 2/5 with superimposed gout flare -  Continue ceftriaxone -  Appreciate orthopedics assistance with washout this morning on 2/11 -  Appreciate infectious disease assistance -  Continue allopurinol -  Solu-Medrol 40 mg IV once today -  Start prednisone 40 mg by mouth daily tomorrow -  No colchicine due to active infection -  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   Consultants:   Orthopedic surgery, Dr. Susa Day  Procedures:  Left knee arthroscopically on 2/11  Antimicrobials:  Anti-infectives    Start     Dose/Rate Route Frequency Ordered Stop   03/30/16 1000  levofloxacin (LEVAQUIN) tablet 500 mg  Status:  Discontinued     500 mg Oral Daily 03/29/16 2132 03/29/16 2143   03/29/16 2230  cefTRIAXone (ROCEPHIN) 2 g in  dextrose 5 % 50 mL IVPB     2 g 100 mL/hr over 30 Minutes Intravenous Daily at bedtime 03/29/16 2214     03/29/16 2200  cefTRIAXone (ROCEPHIN) 2 g in dextrose 5 % 50 mL IVPB  Status:  Discontinued     2 g 100 mL/hr over 30 Minutes Intravenous Every 24 hours 03/29/16 2153 03/29/16 2207   03/29/16 1945  levofloxacin (LEVAQUIN) IVPB 500 mg     500 mg 100 mL/hr over 60 Minutes Intravenous  Once 03/29/16 1941 03/29/16 2119       Subjective:  Left knee pain has not improve despite recent washout. He continues to feel very unwell generally.  He has had recent fevers and chills. He denies nausea. He would like to eat something. He has not had a bowel movement in 3-4 days.  Objective: Vitals:   03/30/16 1000 03/30/16 1100 03/30/16 1200 03/30/16 1300  BP: 128/76 (!) 113/53 (!) 112/58 (!) 125/58  Pulse: 73 60 (!) 58 (!) 56  Resp: 15 (!) 22 19 19   Temp:      TempSrc:      SpO2: 97% 92% 96% 97%  Weight:      Height:        Intake/Output Summary (Last 24 hours) at 03/30/16 1313 Last data filed at 03/30/16 1200  Gross per 24 hour  Intake          1872.08 ml  Output                0 ml  Net          1872.08 ml   Filed Weights   03/29/16 1837 03/29/16 2330  Weight: 95.3 kg (210 lb) 98.8 kg (217 lb 13 oz)    Examination:  General exam:  Adult Male.  No acute distress.  HEENT:  NCAT, MMM Respiratory system: Clear to auscultation bilaterally Cardiovascular system: Regular rate and rhythm, normal S1/S2. No murmurs, rubs, gallops or clicks.  Warm extremities Gastrointestinal system: Normal active bowel sounds, soft, nondistended, nontender. MSK:  Normal tone and bulk, left lower extremity swelling.  ACE bandage around the left knee with drain with serosanguinous drainage.  2+ pedal pulse left foot, < 2 sec CR,   Neuro:  Right foot drop, able to wiggle toes on left foot     Data Reviewed: I have personally reviewed following labs and imaging studies  CBC:  Recent Labs Lab  03/24/16 0638  03/25/16 0528 03/25/16 1905 03/26/16 0553 03/26/16 1721 03/29/16 2022 03/30/16 0338  WBC 18.5*  < > 15.8* 12.1* 11.2* 9.3 12.3* 10.0  NEUTROABS 15.2*  --  12.9*  --   --  6.7 9.7*  --   HGB 14.1  < > 12.5* 12.0* 11.6* 11.7* 10.7* 10.0*  HCT 39.8  < > 35.0* 34.7* 34.1* 33.9* 31.5* 29.6*  MCV 87.1  < > 91.1 90.4 91.4 92.4 91.6 93.4  PLT 186  < > 139* 135* 128* 122* 186 161  < > = values in this interval not displayed. Basic Metabolic Panel:  Recent Labs Lab 03/26/16 0553 03/27/16 0541 03/28/16 0533 03/29/16 2022 03/30/16 0338  NA 139 141 140 140 142  K 4.4 4.3 4.1 4.0 4.0  CL 110 113* 112* 109 110  CO2 22 21* 22 24 23   GLUCOSE 120* 124* 146* 145* 135*  BUN 48* 42* 30* 30* 27*  CREATININE 3.77* 1.97* 1.23 1.39* 1.17  CALCIUM 8.3* 8.4* 8.5* 8.7* 8.2*   GFR: Estimated Creatinine Clearance: 66.4 mL/min (by C-G formula based on SCr of 1.17 mg/dL). Liver Function Tests:  Recent Labs Lab 03/25/16 0528 03/25/16 1900 03/27/16 0541 03/28/16 0533 03/29/16 2022  AST 52* 47* 29 31 66*  ALT 27 28 23  32 56  ALKPHOS 37* 40 38 54 87  BILITOT 1.6* 1.0 1.1 0.8 1.1  PROT 5.3* 5.5* 5.2* 5.5* 6.4*  ALBUMIN 3.0* 2.9* 2.5* 2.5* 2.8*   No results for input(s): LIPASE, AMYLASE in the last 168 hours. No results for input(s): AMMONIA in the last 168 hours. Coagulation Profile:  Recent Labs Lab 03/25/16 0528  INR 1.36   Cardiac Enzymes: No results for input(s): CKTOTAL, CKMB, CKMBINDEX, TROPONINI in the last 168 hours. BNP (last 3 results) No results for input(s): PROBNP in the last 8760 hours. HbA1C: No results for input(s): HGBA1C in the last 72 hours. CBG:  Recent Labs Lab 03/24/16 2107 03/29/16 2204  GLUCAP 131* 129*   Lipid Profile: No results for input(s): CHOL, HDL, LDLCALC, TRIG, CHOLHDL, LDLDIRECT in the last 72 hours. Thyroid Function Tests: No results for input(s): TSH, T4TOTAL, FREET4, T3FREE, THYROIDAB in the last 72 hours. Anemia Panel: No  results for input(s): VITAMINB12, FOLATE, FERRITIN, TIBC, IRON, RETICCTPCT in the last 72 hours. Urine analysis:    Component Value Date/Time   COLORURINE AMBER (A) 03/29/2016 2219   APPEARANCEUR HAZY (A) 03/29/2016 2219   LABSPEC 1.019 03/29/2016 2219   PHURINE 5.0 03/29/2016 2219   GLUCOSEU NEGATIVE 03/29/2016 2219   HGBUR MODERATE (A) 03/29/2016 2219   BILIRUBINUR NEGATIVE 03/29/2016  [place] 03/29/2016 2219   PROTEINUR 30 (A) 03/29/2016 2219   NITRITE NEGATIVE 03/29/2016 2219   LEUKOCYTESUR SMALL (A) 03/29/2016 2219   Sepsis Labs: @LABRCNTIP (procalcitonin:4,lacticidven:4)  ) Recent Results (from the past 240 hour(s))  Blood culture (routine x 2)     Status: Abnormal   Collection Time: 03/24/16  6:38 AM  Result Value Ref Range Status   Specimen Description BLOOD RIGHT HAND  Final   Special Requests BOTTLES DRAWN AEROBIC AND ANAEROBIC 5ML  Final   Culture  Setup Time   Final    GRAM POSITIVE COCCI IN CHAINS IN PAIRS ANAEROBIC BOTTLE ONLY CRITICAL RESULT CALLED TO, READ BACK BY AND VERIFIED WITH: BETH GREEN, PHARMD @0050  03/25/16 MKELLY,MLT Performed at [facility], [place] [address], [place], [place] [address]    Culture STREPTOCOCCUS GROUP G (A)  Final   Report Status 03/26/2016 FINAL  Final   Organism ID, Bacteria STREPTOCOCCUS GROUP G  Final      Susceptibility   Streptococcus group g - MIC*    CLINDAMYCIN >=1 RESISTANT Resistant     AMPICILLIN <=0.25 SENSITIVE Sensitive     ERYTHROMYCIN >=8 RESISTANT Resistant     VANCOMYCIN 0.5 SENSITIVE Sensitive     CEFTRIAXONE <=0.12 SENSITIVE Sensitive     LEVOFLOXACIN 0.5 SENSITIVE Sensitive     * STREPTOCOCCUS GROUP G  Blood Culture ID Panel (Reflexed)     Status: Abnormal   Collection Time: 03/24/16  6:38 AM  Result Value Ref Range Status   Enterococcus species NOT DETECTED NOT DETECTED Final   Listeria monocytogenes NOT DETECTED NOT DETECTED Final   Staphylococcus species NOT DETECTED NOT DETECTED  Final   Staphylococcus aureus NOT DETECTED NOT DETECTED Final   Streptococcus species DETECTED (A) NOT DETECTED Final    Comment: Not Enterococcus species, Streptococcus agalactiae, Streptococcus pyogenes, or Streptococcus pneumoniae. CRITICAL RESULT CALLED TO, READ BACK BY AND VERIFIED WITH: BETH GREEN, PHARMD @0050  03/25/16 MKELLY,MLT    Streptococcus agalactiae NOT DETECTED NOT DETECTED Final   Streptococcus pneumoniae NOT DETECTED NOT DETECTED Final   Streptococcus pyogenes NOT DETECTED NOT DETECTED Final   Acinetobacter baumannii NOT DETECTED NOT DETECTED Final   Enterobacteriaceae species NOT DETECTED NOT DETECTED Final   Enterobacter cloacae complex NOT DETECTED NOT DETECTED Final   Escherichia coli NOT DETECTED NOT DETECTED Final   Klebsiella oxytoca NOT DETECTED NOT DETECTED Final   Klebsiella pneumoniae NOT DETECTED NOT DETECTED Final   Proteus species NOT DETECTED NOT DETECTED Final   Serratia marcescens NOT DETECTED NOT DETECTED Final   Haemophilus influenzae NOT DETECTED NOT DETECTED Final   Neisseria meningitidis NOT DETECTED NOT DETECTED Final   Pseudomonas aeruginosa NOT DETECTED NOT DETECTED Final   Candida albicans NOT DETECTED NOT DETECTED Final   Candida glabrata NOT DETECTED NOT DETECTED Final   Candida krusei NOT DETECTED NOT DETECTED Final   Candida parapsilosis NOT DETECTED NOT DETECTED Final   Candida tropicalis NOT DETECTED NOT DETECTED Final    Comment: Performed at [facility], [place] [address] [place], [place] [address]  Urine culture     Status: Abnormal   Collection Time: 03/24/16  6:48 AM  Result Value Ref Range Status   Specimen Description URINE, CLEAN CATCH  Final   Special Requests NONE  Final   Culture MULTIPLE SPECIES PRESENT, SUGGEST RECOLLECTION (A)  Final   Report Status 03/25/2016 FINAL  Final  Blood culture (routine x 2)     Status: None   Collection  Time: 03/24/16  6:59 AM  Result Value Ref Range Status   Specimen Description  BLOOD LEFT FOREARM  Final   Special Requests BOTTLES DRAWN AEROBIC AND ANAEROBIC 5ML  Final   Culture   Final    NO GROWTH 5 DAYS Performed at [facility], [place] [address] [place], [place] [address]    Report Status 03/29/2016 FINAL  Final  Anaerobic culture     Status: None   Collection Time: 03/24/16  7:20 AM  Result Value Ref Range Status   Specimen Description KNEE LEFT  Final   Special Requests NONE  Final   Culture   Final    NO ANAEROBES ISOLATED Performed at [facility], [place] [address] [place], [place] [address]    Report Status 03/29/2016 FINAL  Final  Body fluid culture     Status: None   Collection Time: 03/24/16  7:30 AM  Result Value Ref Range Status   Specimen Description KNEE LEFT  Final   Special Requests NONE  Final   Gram Stain   Final    WBC PRESENT,BOTH PMN AND MONONUCLEAR NO ORGANISMS SEEN CYTOSPIN SMEAR Gram Stain Report Called to,Read Back By and Verified With: ROSSER,M. RN @0859  ON 2.5.18 BY [facility]    Culture   Final    RARE STREPTOCOCCUS GROUP G Results Called to: RN Lollie Marrow [id number] MLM Performed at [facility], [place] [address], [place], [place] [address]    Report Status 03/28/2016 FINAL  Final   Organism ID, Bacteria STREPTOCOCCUS GROUP G  Final      Susceptibility   Streptococcus group g - MIC*    CLINDAMYCIN >=1 RESISTANT Resistant     AMPICILLIN <=0.25 SENSITIVE Sensitive     ERYTHROMYCIN >=8 RESISTANT Resistant     VANCOMYCIN 0.5 SENSITIVE Sensitive     CEFTRIAXONE <=0.12 SENSITIVE Sensitive     LEVOFLOXACIN 0.5 SENSITIVE Sensitive     * RARE STREPTOCOCCUS GROUP G  Surgical pcr screen     Status: Abnormal   Collection Time: 03/24/16  2:32 PM  Result Value Ref Range Status   MRSA, PCR POSITIVE (A) NEGATIVE Final    Comment: RESULT CALLED TO, READ BACK BY AND VERIFIED WITH: L.HUDSON RN AT 1950 ON 03/24/16 BY S.VANHOORNE    Staphylococcus aureus POSITIVE (A) NEGATIVE Final    Comment:        The Xpert SA Assay  (FDA approved for NASAL specimens in patients over 69 years of age), is one component of a comprehensive surveillance program.  Test performance has been validated by [facility] for patients greater than or equal to 76 year old. It is not intended to diagnose infection nor to guide or monitor treatment.   Aerobic/Anaerobic Culture (surgical/deep wound)     Status: None (Preliminary result)   Collection Time: 03/25/16  4:33 PM  Result Value Ref Range Status   Specimen Description WOUND LEFT KNEE  Final   Special Requests NONE  Final   Gram Stain   Final    RARE WBC PRESENT, PREDOMINANTLY PMN NO ORGANISMS SEEN    Culture   Final    NO GROWTH 5 DAYS Performed at [facility], [place] [address] [place],  [address]    Report Status PENDING  Incomplete  Body fluid culture     Status: None (Preliminary result)   Collection Time: 03/29/16  9:58 PM  Result Value Ref Range Status   Specimen Description KNEE LEFT  Final   Special Requests Normal  Final   Gram Stain   Final    WBC PRESENT,BOTH PMN AND MONONUCLEAR NO ORGANISMS SEEN Gram Stain Report Called to,Read Back By and Verified With: B.JESSEE AT 2309 BY W.SHEA ON 03/29/16    Culture PENDING  Incomplete   Report Status PENDING  Incomplete      Radiology Studies: Dg Knee 2 Views Left  Result Date: 03/29/2016 CLINICAL DATA:  Left knee pain, warmth, and swelling, fever, status post recent left knee arthroscopy EXAM: LEFT KNEE - 1-2 VIEW COMPARISON:  03/24/2016 FINDINGS: No fracture or dislocation is seen. The joint spaces are preserved.  Tiny patellofemoral osteophytes. Moderate suprapatellar knee joint effusion, unchanged. IMPRESSION: Moderate suprapatellar knee joint effusion, unchanged. Electronically Signed   By: Julian Hy M.D.   On: 03/29/2016 23:08     Scheduled Meds: . allopurinol  300 mg Oral Daily  . atorvastatin  20 mg Oral q1800  . cefTRIAXone (ROCEPHIN)  IV  2 g Intravenous QHS  . ferrous  sulfate  325 mg Oral TID PC  . gabapentin  300 mg Oral TID  . heparin  5,000 Units Subcutaneous Q8H  . levothyroxine  50 mcg Oral QAC breakfast  . methylPREDNISolone (SOLU-MEDROL) injection  40 mg Intravenous Once  . omega-3 acid ethyl esters  1 g Oral Daily  . [START ON 03/31/2016] predniSONE  40 mg Oral Q breakfast  . senna  1 tablet Oral BID  . tamsulosin  0.4 mg Oral Daily   Continuous Infusions:   LOS: 1 day    Time spent: 30 min    Janece Canterbury, MD [facility] Pager [phone]  If 7PM-7AM, please contact night-coverage [email or web address] Password [email or web address] 03/30/2016, 1:13 PM

## 2016-03-30 NOTE — Op Note (Deleted)
  The note originally documented on this encounter has been moved the the encounter in which it belongs.  

## 2016-03-30 NOTE — Op Note (Signed)
NAMECADON, KIRALY NO.:  [id number]  MEDICAL RECORD NO.:  [id number]  LOCATION:                               FACILITY:  [facility]  PHYSICIAN:  Susa Day, M.D.    DATE OF BIRTH:  02-24-1940  DATE OF PROCEDURE:  03/30/2016 DATE OF DISCHARGE:  04/01/2016                              OPERATIVE REPORT   PREOPERATIVE DIAGNOSIS:  Septic arthritis, left knee.  POSTOPERATIVE DIAGNOSIS:  Septic arthritis, left knee.  PROCEDURES PERFORMED: 1. Left knee arthroscopy. 2. Extensive synovectomy, left knee. 3. Lavage of left knee.  ANESTHESIA:  General.  ASSISTANT:  None.  HISTORY:  A 76 year old with history of septic knee status post arthroscopies, on antibiotics, IV and p.o., had a relapse, returned to the emergency room.  We saw him last night in the emergency room.  Gram stain negative, organisms noted.  He was noted though be septic.  He was indicated for irrigation debridement and lavage of the knee presuming a relapse of the septic arthritis given his constitutional parameters. Risks and benefits discussed including bleeding, infection, need for repeat revision, etc.  DESCRIPTION OF PROCEDURE:  The patient in a supine position, after induction of adequate anesthesia, the patient had received Zosyn and IV levofloxacin.  The left lower extremity was prepped and draped in the usual sterile fashion.  We removed previous surgical incisions from the arthroscopy.  There was some redness around those surgical incision, therefore used all turn of the portals with 11 blade.  We used a superomedial portal, a lateral portal, and a medial portal.  Ingress cannula atraumatically placed.  We drained cloudy synovial fluid.  Did not send it for specimen as we had an excellent specimen from a previous aspiration.  I then placed an outflow cannula and then a shaver introduced through a medial portal.  We then proceeded to lavage the knee with 9 L of fluid.  Inspection within  the joint revealed hypertrophic synovitis noted.  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  We performed a thorough synovectomy and copiously lavaged the knee.  Again with 9 L, constantly massaging the suprapatellar pouch in the joint, and flexion and extending the knee to clean the entire joint.  Following its conclusion, we had clear effluent emanating from the knee.  We then removed the Ingress cannula, gently flexed and extended the knee to evacuate all fluid.  I closed the portals with 3-0 nylon, placed a Hemovac in the superomedial wound, non sutured in.  Placed it in a sterile dressing and an Ace bandage.  The patient was then extubated and transported to the recovery room in satisfactory condition.  The patient tolerated the procedure well.  No complications.  Minimal blood loss.     Susa Day, M.D.     Geralynn Rile  D:  03/30/2016  T:  03/30/2016  Job:  [id number]

## 2016-03-31 ENCOUNTER — Encounter (HOSPITAL_COMMUNITY): Payer: Self-pay | Admitting: Specialist

## 2016-03-31 DIAGNOSIS — I82402 Acute embolism and thrombosis of unspecified deep veins of left lower extremity: Secondary | ICD-10-CM | POA: Diagnosis present

## 2016-03-31 DIAGNOSIS — I824Z2 Acute embolism and thrombosis of unspecified deep veins of left distal lower extremity: Secondary | ICD-10-CM

## 2016-03-31 LAB — AEROBIC/ANAEROBIC CULTURE W GRAM STAIN (SURGICAL/DEEP WOUND): Culture: NO GROWTH

## 2016-03-31 LAB — HEPARIN LEVEL (UNFRACTIONATED)
HEPARIN UNFRACTIONATED: 0.53 [IU]/mL (ref 0.30–0.70)
HEPARIN UNFRACTIONATED: 0.51 [IU]/mL (ref 0.30–0.70)
HEPARIN UNFRACTIONATED: 0.24 [IU]/mL — AB (ref 0.30–0.70)

## 2016-03-31 LAB — GLUCOSE, CAPILLARY
GLUCOSE-CAPILLARY: 220 mg/dL — AB (ref 65–99)
Glucose-Capillary: 224 mg/dL — ABNORMAL HIGH (ref 65–99)
Glucose-Capillary: 293 mg/dL — ABNORMAL HIGH (ref 65–99)

## 2016-03-31 LAB — BASIC METABOLIC PANEL
Anion gap: 7 (ref 5–15)
BUN: 28 mg/dL — ABNORMAL HIGH (ref 6–20)
CO2: 22 mmol/L (ref 22–32)
CREATININE: 0.97 mg/dL (ref 0.61–1.24)
Calcium: 8.3 mg/dL — ABNORMAL LOW (ref 8.9–10.3)
Chloride: 107 mmol/L (ref 101–111)
GFR calc Af Amer: >60 mL/min (ref >60)
Glucose, Bld: 278 mg/dL — ABNORMAL HIGH (ref 65–99)
Potassium: 4.4 mmol/L (ref 3.5–5.1)
SODIUM: 136 mmol/L (ref 135–145)

## 2016-03-31 LAB — CBC
HCT: 30.1 % — ABNORMAL LOW (ref 39.0–52.0)
Hemoglobin: 10.5 g/dL — ABNORMAL LOW (ref 13.0–17.0)
MCH: 31.9 pg (ref 26.0–34.0)
MCHC: 34.9 g/dL (ref 30.0–36.0)
MCV: 91.5 fL (ref 78.0–100.0)
PLATELETS: 206 10*3/uL (ref 150–400)
RBC: 3.29 MIL/uL — ABNORMAL LOW (ref 4.22–5.81)
RDW: 14 % (ref 11.5–15.5)
WBC: 11.2 10*3/uL — AB (ref 4.0–10.5)

## 2016-03-31 LAB — PATHOLOGIST SMEAR REVIEW

## 2016-03-31 MED ORDER — HYDROCHLOROTHIAZIDE 25 MG PO TABS
25.0000 mg | ORAL_TABLET | Freq: Every day | ORAL | Status: DC
  Administered 2016-03-31 – 2016-04-01 (×2): 25 mg via ORAL
  Filled 2016-03-31 (×2): qty 1

## 2016-03-31 MED ORDER — HEPARIN (PORCINE) IN NACL 100-0.45 UNIT/ML-% IJ SOLN
1900.0000 [IU]/h | INTRAMUSCULAR | Status: DC
  Administered 2016-03-31 (×2): 1900 [IU]/h via INTRAVENOUS
  Filled 2016-03-31 (×2): qty 250

## 2016-03-31 MED ORDER — IRBESARTAN 300 MG PO TABS
300.0000 mg | ORAL_TABLET | Freq: Every day | ORAL | Status: DC
  Administered 2016-03-31 – 2016-04-01 (×2): 300 mg via ORAL
  Filled 2016-03-31: qty 2
  Filled 2016-03-31 (×2): qty 1

## 2016-03-31 MED ORDER — INSULIN ASPART 100 UNIT/ML ~~LOC~~ SOLN
0.0000 [IU] | Freq: Three times a day (TID) | SUBCUTANEOUS | Status: DC
  Administered 2016-03-31 (×2): 3 [IU] via SUBCUTANEOUS
  Administered 2016-04-01 (×2): 5 [IU] via SUBCUTANEOUS

## 2016-03-31 NOTE — Progress Notes (Signed)
Patient ID: Zachary Mcdonald, male   DOB: 08/11/1940, 76 y.o.   MRN: [id number]         [facility]  Date of Admission:  03/29/2016    Total days of antibiotics 8                Principal Problem:   Acute gout Active Problems:   Septic arthritis of knee, left (HCC)   Streptococcal bacteremia   Acute deep vein thrombosis (DVT) of left lower extremity (HCC)   Hypertension   Bladder outflow obstruction   . allopurinol  300 mg Oral Daily  . atorvastatin  20 mg Oral q1800  . bisacodyl  10 mg Rectal Once  . cefTRIAXone (ROCEPHIN)  IV  2 g Intravenous QHS  . ferrous sulfate  325 mg Oral TID PC  . gabapentin  300 mg Oral TID  . hydrochlorothiazide  25 mg Oral Daily  . irbesartan  300 mg Oral Daily  . levothyroxine  50 mcg Oral QAC breakfast  . omega-3 acid ethyl esters  1 g Oral Daily  . polyethylene glycol  17 g Oral BID  . predniSONE  40 mg Oral Q breakfast  . senna  1 tablet Oral BID  . tamsulosin  0.4 mg Oral Daily    SUBJECTIVE: Zachary Mcdonald is feeling better today. His severe left knee pain has resolved. In addition to having septic arthritis of his left knee and an acute gouty flare he was also diagnosed with DVT in his peroneal veins of his left leg yesterday. He is now on heparin. He and his wife are very concerned about being discharged home to soon. He is scheduled to be seen back at [facility] on Wednesday, 04/02/2016 at 8 AM to have his Foley catheter removed. They are concerned that it would be very difficult for them to get there by 8 AM from their home in [place]. His wife is also very concerned that he may not be strong enough to go home yet.  Review of Systems: Review of Systems  Constitutional: Positive for chills and fever. Negative for diaphoresis.  Gastrointestinal: Negative for abdominal pain, diarrhea, nausea and vomiting.  Musculoskeletal: Negative for joint pain.    Past Medical History:  Diagnosis Date  . Arthritis      spine and hands  . Borderline diabetes   . Colon polyps    Tubular Adenoma   . GERD (gastroesophageal reflux disease)   . Gout   . Helicobacter pylori gastritis   . Hyperlipidemia   . Hypertension   . Hyperthyroidism     Social History  Substance Use Topics  . Smoking status: Never Smoker  . Smokeless tobacco: Never Used  . Alcohol use No    Family History  Problem Relation Age of Onset  . Diabetes Mother   . Colon cancer Brother     50's  . Diabetes Brother    Allergies  Allergen Reactions  . Oxycontin [Oxycodone Hcl] Nausea And Vomiting and Other (See Comments)    Dizziness   . Valsartan-Hydrochlorothiazide Diarrhea    OBJECTIVE: Vitals:   03/31/16 0500 03/31/16 0600 03/31/16 0700 03/31/16 0800  BP: (!) 147/62 (!) 129/49 (!) 142/47   Pulse: 80 78 75   Resp: 20 17 18    Temp:    98 F (36.7 C)  TempSrc:    Oral  SpO2: 95% 95% 92%   Weight:      Height:  Body mass index is 29.54 kg/m.  Physical Exam  Constitutional: He is oriented to person, place, and time.  He is much more comfortable today. His wife is present. He is reading the paper in bed.  Musculoskeletal:  He has a little bit of bloody drainage from his left knee on his gauze dressing.  Neurological: He is alert and oriented to person, place, and time.  Skin: No rash noted.  Psychiatric: Mood and affect normal.    Lab Results Lab Results  Component Value Date   WBC 11.2 (H) 03/31/2016   HGB 10.5 (L) 03/31/2016   HCT 30.1 (L) 03/31/2016   MCV 91.5 03/31/2016   PLT 206 03/31/2016    Lab Results  Component Value Date   CREATININE 0.97 03/31/2016   BUN 28 (H) 03/31/2016   NA 136 03/31/2016   K 4.4 03/31/2016   CL 107 03/31/2016   CO2 22 03/31/2016    Lab Results  Component Value Date   ALT 56 03/29/2016   AST 66 (H) 03/29/2016   ALKPHOS 87 03/29/2016   BILITOT 1.1 03/29/2016    No results found for: ESRSEDRATE, CRP   Microbiology: Recent Results (from the past 240  hour(s))  Blood culture (routine x 2)     Status: Abnormal   Collection Time: 03/24/16  6:38 AM  Result Value Ref Range Status   Specimen Description BLOOD RIGHT HAND  Final   Special Requests BOTTLES DRAWN AEROBIC AND ANAEROBIC 5ML  Final   Culture  Setup Time   Final    GRAM POSITIVE COCCI IN CHAINS IN PAIRS ANAEROBIC BOTTLE ONLY CRITICAL RESULT CALLED TO, READ BACK BY AND VERIFIED WITH: BETH GREEN, PHARMD @0050  03/25/16 MKELLY,MLT Performed at [facility], [address], [place], [place] [address]    Culture STREPTOCOCCUS GROUP G (A)  Final   Report Status 03/26/2016 FINAL  Final   Organism ID, Bacteria STREPTOCOCCUS GROUP G  Final      Susceptibility   Streptococcus group g - MIC*    CLINDAMYCIN >=1 RESISTANT Resistant     AMPICILLIN <=0.25 SENSITIVE Sensitive     ERYTHROMYCIN >=8 RESISTANT Resistant     VANCOMYCIN 0.5 SENSITIVE Sensitive     CEFTRIAXONE <=0.12 SENSITIVE Sensitive     LEVOFLOXACIN 0.5 SENSITIVE Sensitive     * STREPTOCOCCUS GROUP G  Blood Culture ID Panel (Reflexed)     Status: Abnormal   Collection Time: 03/24/16  6:38 AM  Result Value Ref Range Status   Enterococcus species NOT DETECTED NOT DETECTED Final   Listeria monocytogenes NOT DETECTED NOT DETECTED Final   Staphylococcus species NOT DETECTED NOT DETECTED Final   Staphylococcus aureus NOT DETECTED NOT DETECTED Final   Streptococcus species DETECTED (A) NOT DETECTED Final    Comment: Not Enterococcus species, Streptococcus agalactiae, Streptococcus pyogenes, or Streptococcus pneumoniae. CRITICAL RESULT CALLED TO, READ BACK BY AND VERIFIED WITH: BETH GREEN, PHARMD @0050  03/25/16 MKELLY,MLT    Streptococcus agalactiae NOT DETECTED NOT DETECTED Final   Streptococcus pneumoniae NOT DETECTED NOT DETECTED Final   Streptococcus pyogenes NOT DETECTED NOT DETECTED Final   Acinetobacter baumannii NOT DETECTED NOT DETECTED Final   Enterobacteriaceae species NOT DETECTED NOT DETECTED Final    Enterobacter cloacae complex NOT DETECTED NOT DETECTED Final   Escherichia coli NOT DETECTED NOT DETECTED Final   Klebsiella oxytoca NOT DETECTED NOT DETECTED Final   Klebsiella pneumoniae NOT DETECTED NOT DETECTED Final   Proteus species NOT DETECTED NOT DETECTED Final   Serratia marcescens  NOT DETECTED NOT DETECTED Final   Haemophilus influenzae NOT DETECTED NOT DETECTED Final   Neisseria meningitidis NOT DETECTED NOT DETECTED Final   Pseudomonas aeruginosa NOT DETECTED NOT DETECTED Final   Candida albicans NOT DETECTED NOT DETECTED Final   Candida glabrata NOT DETECTED NOT DETECTED Final   Candida krusei NOT DETECTED NOT DETECTED Final   Candida parapsilosis NOT DETECTED NOT DETECTED Final   Candida tropicalis NOT DETECTED NOT DETECTED Final    Comment: Performed at [facility], [place] [address], [place], [place] [address]  Urine culture     Status: Abnormal   Collection Time: 03/24/16  6:48 AM  Result Value Ref Range Status   Specimen Description URINE, CLEAN CATCH  Final   Special Requests NONE  Final   Culture MULTIPLE SPECIES PRESENT, SUGGEST RECOLLECTION (A)  Final   Report Status 03/25/2016 FINAL  Final  Blood culture (routine x 2)     Status: None   Collection Time: 03/24/16  6:59 AM  Result Value Ref Range Status   Specimen Description BLOOD LEFT FOREARM  Final   Special Requests BOTTLES DRAWN AEROBIC AND ANAEROBIC 5ML  Final   Culture   Final    NO GROWTH 5 DAYS Performed at [facility], [place] [address] [place], [place] [address]    Report Status 03/29/2016 FINAL  Final  Anaerobic culture     Status: None   Collection Time: 03/24/16  7:20 AM  Result Value Ref Range Status   Specimen Description KNEE LEFT  Final   Special Requests NONE  Final   Culture   Final    NO ANAEROBES ISOLATED Performed at [facility], [place] [address], [place],  [address]    Report Status 03/29/2016 FINAL  Final  Body fluid culture     Status: None   Collection  Time: 03/24/16  7:30 AM  Result Value Ref Range Status   Specimen Description KNEE LEFT  Final   Special Requests NONE  Final   Gram Stain   Final    WBC PRESENT,BOTH PMN AND MONONUCLEAR NO ORGANISMS SEEN CYTOSPIN SMEAR Gram Stain Report Called to,Read Back By and Verified With: ROSSER,M. RN @0859  ON 2.5.18 BY [facility]    Culture   Final    RARE STREPTOCOCCUS GROUP G Results Called to: RN Lollie Marrow [id number] MLM Performed at [facility], [place] [address], [place],  [address]    Report Status 03/28/2016 FINAL  Final   Organism ID, Bacteria STREPTOCOCCUS GROUP G  Final      Susceptibility   Streptococcus group g - MIC*    CLINDAMYCIN >=1 RESISTANT Resistant     AMPICILLIN <=0.25 SENSITIVE Sensitive     ERYTHROMYCIN >=8 RESISTANT Resistant     VANCOMYCIN 0.5 SENSITIVE Sensitive     CEFTRIAXONE <=0.12 SENSITIVE Sensitive     LEVOFLOXACIN 0.5 SENSITIVE Sensitive     * RARE STREPTOCOCCUS GROUP G  Surgical pcr screen     Status: Abnormal   Collection Time: 03/24/16  2:32 PM  Result Value Ref Range Status   MRSA, PCR POSITIVE (A) NEGATIVE Final    Comment: RESULT CALLED TO, READ BACK BY AND VERIFIED WITH: L.HUDSON RN AT 1950 ON 03/24/16 BY S.VANHOORNE    Staphylococcus aureus POSITIVE (A) NEGATIVE Final    Comment:        The Xpert SA Assay (FDA approved for NASAL specimens in patients over 26 years of age), is one component of  a comprehensive surveillance program.  Test performance has been validated by [facility] for patients greater than or equal to 72 year old. It is not intended to diagnose infection nor to guide or monitor treatment.   Aerobic/Anaerobic Culture (surgical/deep wound)     Status: None   Collection Time: 03/25/16  4:33 PM  Result Value Ref Range Status   Specimen Description WOUND LEFT KNEE  Final   Special Requests NONE  Final   Gram Stain   Final    RARE WBC PRESENT, PREDOMINANTLY PMN NO ORGANISMS SEEN    Culture   Final    NO GROWTH  5 DAYS Performed at [facility], [place] [address], [place], [place] [address]    Report Status 03/30/2016 FINAL  Final  Body fluid culture     Status: None (Preliminary result)   Collection Time: 03/29/16  9:58 PM  Result Value Ref Range Status   Specimen Description KNEE LEFT  Final   Special Requests Normal  Final   Gram Stain   Final    WBC PRESENT,BOTH PMN AND MONONUCLEAR NO ORGANISMS SEEN Gram Stain Report Called to,Read Back By and Verified With: B.JESSEE AT 2309 BY W.SHEA ON 03/29/16    Culture   Final    NO GROWTH 1 DAY Performed at [facility], [place] [address] [place], [place] [address]    Report Status PENDING  Incomplete     ASSESSMENT: He has group G streptococcal arthritis of his left knee. He underwent repeat surgery yesterday. His operative Gram stain did not reveal any organism and cultures are pending. I still plan on converting him to oral levofloxacin on discharge and continuing him at least until he sees me in clinic on 04/16/2016.   He has an acute flare of gout that is improving after washout of his knee and starting prednisone.  He has an acute DVT. If he is transitioned to Coumadin we will need to account for the drug drug interaction with levofloxacin.  PLAN: 1. Continue ceftriaxone for now and transition to oral levofloxacin 500 mg daily on discharge 2. I will arrange follow-up in my clinic on 04/16/2016  Michel Bickers, MD [facility] [phone] pager   [phone] cell 03/31/2016, 10:21 AM

## 2016-03-31 NOTE — Progress Notes (Signed)
PT Cancellation Note  Patient Details Name: Zachary Mcdonald MRN: [id number] DOB: 12/14/1940   Cancelled Treatment:    Reason Eval/Treat Not Completed: Medical issues which prohibited therapy (New DVT dx, started heparin. will check back tomorrow. )   Claretha Cooper 03/31/2016, 12:02 PM Tresa Endo PT [phone]

## 2016-03-31 NOTE — Progress Notes (Signed)
[facility] for Heparin Indication: DVT  Allergies  Allergen Reactions  . Oxycontin [Oxycodone Hcl] Nausea And Vomiting and Other (See Comments)    Dizziness   . Valsartan-Hydrochlorothiazide Diarrhea    Patient Measurements: Height: 6' (182.9 cm) Weight: 217 lb 13 oz (98.8 kg) IBW/kg (Calculated) : 77.6 Heparin Dosing Weight: 97.5  Vital Signs: Temp: 98 F (36.7 C) (02/12 0800) Temp Source: Oral (02/12 0800) BP: 122/65 (02/12 1000) Pulse Rate: 75 (02/12 0800)  Labs:  Recent Labs  03/29/16 2022 03/30/16 0338 03/31/16 0048 03/31/16 1147  HGB 10.7* 10.0* 10.5*  --   HCT 31.5* 29.6* 30.1*  --   PLT 186 161 206  --   HEPARINUNFRC  --   --  0.24* 0.53  CREATININE 1.39* 1.17 0.97  --     Estimated Creatinine Clearance: 80.1 mL/min (by C-G formula based on SCr of 0.97 mg/dL).   Medical History: Past Medical History:  Diagnosis Date  . Arthritis    spine and hands  . Borderline diabetes   . Colon polyps    Tubular Adenoma   . GERD (gastroesophageal reflux disease)   . Gout   . Helicobacter pylori gastritis   . Hyperlipidemia   . Hypertension   . Hyperthyroidism     Medications:  Scheduled:  . allopurinol  300 mg Oral Daily  . atorvastatin  20 mg Oral q1800  . bisacodyl  10 mg Rectal Once  . cefTRIAXone (ROCEPHIN)  IV  2 g Intravenous QHS  . ferrous sulfate  325 mg Oral TID PC  . gabapentin  300 mg Oral TID  . hydrochlorothiazide  25 mg Oral Daily  . insulin aspart  0-9 Units Subcutaneous TID WC  . irbesartan  300 mg Oral Daily  . levothyroxine  50 mcg Oral QAC breakfast  . omega-3 acid ethyl esters  1 g Oral Daily  . polyethylene glycol  17 g Oral BID  . predniSONE  40 mg Oral Q breakfast  . senna  1 tablet Oral BID  . tamsulosin  0.4 mg Oral Daily   Infusions:  . heparin 1,900 Units/hr (03/31/16 1000)    Assessment: 39 yoM readmitted on 2/10 with acute gout flare superimposed on recent septic arthritis.  He  was discharge on 2/9 after admission significant for septic arthritis of knee (s/p aspiration and washout), Streptococcal bacteremia (treated with Ceftriaxone IV then discharged on Levaquin PO), and AKI due to bladder outlet obstruction which resolved after Foley catheter placement.  Now positive for acute DVT in LLE - pharmacy is consulted to dose Heparin IV.   No PTA anticoagulants.  While inpatient on previous admission, he received Heparin SQ for VTE prophylaxis from 2/5-2/6, then Lovenox (dose adjusted for renal function) on 2/7-2/8.  This admission, he last received Heparin SQ for VTE prophylaxis on 2/11 at 12:00  Today, 03/31/2016  Heparin level therapeutic (0.53) on 1900 units/hr   Hgb low but stable, Plts wnl  No bleeding or complications with infusion reported   Goal of Therapy:  Heparin level 0.3-0.7 units/ml Monitor platelets by anticoagulation protocol: Yes   Plan:   Continue heparin drip at 1900 units/hr  Recheck heparin level in 8 hours after to verify therapeutic  Daily heparin level and CBC  Continue to monitor for s/s bleeding, thrombosis.  Follow up plans for further procedures or change to Lovenox or DOAC per MD notes.  Peggyann Juba, PharmD, BCPS Pager: [phone] 03/31/2016,12:27 PM

## 2016-03-31 NOTE — Progress Notes (Signed)
Subjective: 1 Day Post-Op Procedure(s) (LRB): ARTHROSCOPY KNEE (Left) Patient reports pain as 2 on 0-10 scale. Hemovac DCd and knee looks fine. Afebrile and WBC is slightly elevated but drain was in. Will repeat in A.M.    Objective: Vital signs in last 24 hours: Temp:  [97.3 F (36.3 C)-98.6 F (37 C)] 97.3 F (36.3 C) (02/12 0323) Pulse Rate:  [56-85] 75 (02/12 0700) Resp:  [12-24] 18 (02/12 0700) BP: (102-166)/(47-88) 142/47 (02/12 0700) SpO2:  [89 %-99 %] 92 % (02/12 0700)  Intake/Output from previous day: 02/11 0701 - 02/12 0700 In: 2045.9 [P.O.:120; I.V.:1875.9; IV Piggyback:50] Out: 650 [Urine:650] Intake/Output this shift: No intake/output data recorded.   Recent Labs  03/29/16 2022 03/30/16 0338 03/31/16 0048  HGB 10.7* 10.0* 10.5*    Recent Labs  03/30/16 0338 03/31/16 0048  WBC 10.0 11.2*  RBC 3.17* 3.29*  HCT 29.6* 30.1*  PLT 161 206    Recent Labs  03/30/16 0338 03/31/16 0048  NA 142 136  K 4.0 4.4  CL 110 107  CO2 23 22  BUN 27* 28*  CREATININE 1.17 0.97  GLUCOSE 135* 278*  CALCIUM 8.2* 8.3*   No results for input(s): LABPT, INR in the last 72 hours.  No cellulitis present Compartment soft  Assessment/Plan: 1 Day Post-Op Procedure(s) (LRB): ARTHROSCOPY KNEE (Left) Up with therapy  Glendell Schlottman A 03/31/2016, 7:20 AM

## 2016-03-31 NOTE — Progress Notes (Addendum)
[facility] for Heparin Indication: DVT  Allergies  Allergen Reactions  . Oxycontin [Oxycodone Hcl] Nausea And Vomiting and Other (See Comments)    Dizziness   . Valsartan-Hydrochlorothiazide Diarrhea    Patient Measurements: Height: 6' (182.9 cm) Weight: 217 lb 13 oz (98.8 kg) IBW/kg (Calculated) : 77.6 Heparin Dosing Weight: 97.5  Vital Signs: Temp: 98.2 F (36.8 C) (02/12 1232) Temp Source: Oral (02/12 1232) BP: 130/62 (02/12 1232) Pulse Rate: 53 (02/12 1232)  Labs:  Recent Labs  03/29/16 2022 03/30/16 0338 03/31/16 0048 03/31/16 1147 03/31/16 2004  HGB 10.7* 10.0* 10.5*  --   --   HCT 31.5* 29.6* 30.1*  --   --   PLT 186 161 206  --   --   HEPARINUNFRC  --   --  0.24* 0.53 0.51  CREATININE 1.39* 1.17 0.97  --   --     Estimated Creatinine Clearance: 80.1 mL/min (by C-G formula based on SCr of 0.97 mg/dL).   Medical History: Past Medical History:  Diagnosis Date  . Arthritis    spine and hands  . Borderline diabetes   . Colon polyps    Tubular Adenoma   . GERD (gastroesophageal reflux disease)   . Gout   . Helicobacter pylori gastritis   . Hyperlipidemia   . Hypertension   . Hyperthyroidism     Medications:  Scheduled:  . allopurinol  300 mg Oral Daily  . atorvastatin  20 mg Oral q1800  . bisacodyl  10 mg Rectal Once  . cefTRIAXone (ROCEPHIN)  IV  2 g Intravenous QHS  . ferrous sulfate  325 mg Oral TID PC  . gabapentin  300 mg Oral TID  . hydrochlorothiazide  25 mg Oral Daily  . insulin aspart  0-9 Units Subcutaneous TID WC  . irbesartan  300 mg Oral Daily  . levothyroxine  50 mcg Oral QAC breakfast  . omega-3 acid ethyl esters  1 g Oral Daily  . polyethylene glycol  17 g Oral BID  . predniSONE  40 mg Oral Q breakfast  . senna  1 tablet Oral BID  . tamsulosin  0.4 mg Oral Daily   Infusions:  . heparin 1,900 Units/hr (03/31/16 1200)    Assessment: 53 yoM readmitted on 2/10 with acute gout flare  superimposed on recent septic arthritis of knee. Now positive for acute DVT in LLE - pharmacy is consulted to dose Heparin IV.   No PTA anticoagulants.  This admission, he last received Heparin SQ for VTE prophylaxis on 2/11 at 12:00  Today, 03/31/2016  Heparin level at 2004 = 0.51 units/mL, therapeutic on heparin infusion at 1900 units/hr   Hgb low but stable, Pltc WNL  No bleeding or complications with infusion reported by nursing  Goal of Therapy:  Heparin level 0.3-0.7 units/ml Monitor platelets by anticoagulation protocol: Yes   Plan:   Continue heparin infusion at 1900 units/hr.  Daily heparin level and CBC while on heparin infusion.  Continue to monitor for s/sx bleeding, thrombosis.  Noted plans to transition to [place] tomorrow.   Lindell Spar, PharmD, BCPS Pager: [phone] 03/31/2016 8:48 PM

## 2016-03-31 NOTE — Progress Notes (Signed)
Transferred to room 1441 via bed .

## 2016-03-31 NOTE — Progress Notes (Signed)
PROGRESS NOTE  Zachary Mcdonald  [id number] DOB: 1940-09-02 DOA: 03/29/2016 PCP: Tivis Ringer, MD  Brief Narrative:   The patient is a 76 year old male with history of hypertension, hyperlipidemia, hypothyroidism, gout, borderline diabetes who was recently admitted for septic arthritis of the left knee with bacteremia due to group G strep.    Assessment & Plan:   Principal Problem:   Acute gout Active Problems:   Hypertension   Septic arthritis of knee, left (HCC)   Streptococcal bacteremia   Bladder outflow obstruction   Acute deep vein thrombosis (DVT) of left lower extremity (HCC)  Septic arthritis of the left knee due to group G strep with group G strep bacteremia diagnosed on 2/5 with superimposed gout flare -  Continue ceftriaxone -  Appreciate orthopedics assistance with washout on 2/11 -  Culture:  No growth, no organisms on gram stain -  Drain removed on [id number] without complication -  Appreciate infectious disease assistance -  Continue allopurinol -  Prednisone 40 mg by mouth daily until pain has improved, then taper slowly over a week -  PT evaluation  Left lower extremity DVT, calf -  Heparin gtt today and plan to start apixaban in AM  Bladder outflow obstruction, stable, continue Foley -  Continue flomax  Hypertension, blood pressures have risen -  Resume ARB and HCTZ  Bigeminy and trigeminy with bradycardia in the 30s-40s, asymptomatic -  No atrial fibrillation on telemetry -  Continue telemetry  -  Not on AV nodal blocking medications  Constipation -  Continue miralax and senna  Hyperglycemia from steroids -  Check A1c -  Start low dose SSI  DVT prophylaxis:  Heparin for now in case he needs additional procedures, but will switch to Lovenox tomorrow if no further washouts are necessary Code Status:  Full code Family Communication:  Patient, his wife is at bedside Disposition Plan:  Transfer to telemetry   Consultants:   Orthopedic  surgery, Dr. Susa Day  Procedures:  Left knee arthroscopically on 2/11  Antimicrobials:  Anti-infectives    Start     Dose/Rate Route Frequency Ordered Stop   03/30/16 1000  levofloxacin (LEVAQUIN) tablet 500 mg  Status:  Discontinued     500 mg Oral Daily 03/29/16 2132 03/29/16 2143   03/29/16 2230  cefTRIAXone (ROCEPHIN) 2 g in dextrose 5 % 50 mL IVPB     2 g 100 mL/hr over 30 Minutes Intravenous Daily at bedtime 03/29/16 2214     03/29/16 2200  cefTRIAXone (ROCEPHIN) 2 g in dextrose 5 % 50 mL IVPB  Status:  Discontinued     2 g 100 mL/hr over 30 Minutes Intravenous Every 24 hours 03/29/16 2153 03/29/16 2207   03/29/16 1945  levofloxacin (LEVAQUIN) IVPB 500 mg     500 mg 100 mL/hr over 60 Minutes Intravenous  Once 03/29/16 1941 03/29/16 2119       Subjective:  Left knee pain improved.  Drain pulled earlier and feels some pain where drain was removed.  Persistent swelling of the bilateral lower extremities   Objective: Vitals:   03/31/16 0500 03/31/16 0600 03/31/16 0700 03/31/16 0800  BP: (!) 147/62 (!) 129/49 (!) 142/47 (!) 141/43  Pulse: 80 78 75 75  Resp: 20 17 18  (!) 21  Temp:    98 F (36.7 C)  TempSrc:    Oral  SpO2: 95% 95% 92% 94%  Weight:      Height:        Intake/Output Summary (  Last 24 hours) at 03/31/16 1050 Last data filed at 03/31/16 1000  Gross per 24 hour  Intake          1393.85 ml  Output             1050 ml  Net           343.85 ml   Filed Weights   03/29/16 1837 03/29/16 2330  Weight: 95.3 kg (210 lb) 98.8 kg (217 lb 13 oz)    Examination:  General exam:  Adult Male.  No acute distress.  HEENT:  NCAT, MMM Respiratory system: Clear to auscultation bilaterally Cardiovascular system: IRRR, normal S1/S2. No murmurs, rubs, gallops or clicks.  Warm extremities Gastrointestinal system: Normal active bowel sounds, soft, nondistended, nontender. MSK:  Normal tone and bulk, 1+ pitting bilateral lower extremity pitting edema.  Bandages  around left knee. 2+ pedal pulse left foot, < 2 sec CR,   Neuro:  Right foot drop, able to wiggle toes on left foot     Data Reviewed: I have personally reviewed following labs and imaging studies  CBC:  Recent Labs Lab 03/25/16 0528  03/26/16 0553 03/26/16 1721 03/29/16 2022 03/30/16 0338 03/31/16 0048  WBC 15.8*  < > 11.2* 9.3 12.3* 10.0 11.2*  NEUTROABS 12.9*  --   --  6.7 9.7*  --   --   HGB 12.5*  < > 11.6* 11.7* 10.7* 10.0* 10.5*  HCT 35.0*  < > 34.1* 33.9* 31.5* 29.6* 30.1*  MCV 91.1  < > 91.4 92.4 91.6 93.4 91.5  PLT 139*  < > 128* 122* 186 161 206  < > = values in this interval not displayed. Basic Metabolic Panel:  Recent Labs Lab 03/27/16 0541 03/28/16 0533 03/29/16 2022 03/30/16 0338 03/31/16 0048  NA 141 140 140 142 136  K 4.3 4.1 4.0 4.0 4.4  CL 113* 112* 109 110 107  CO2 21* 22 24 23 22   GLUCOSE 124* 146* 145* 135* 278*  BUN 42* 30* 30* 27* 28*  CREATININE 1.97* 1.23 1.39* 1.17 0.97  CALCIUM 8.4* 8.5* 8.7* 8.2* 8.3*   GFR: Estimated Creatinine Clearance: 80.1 mL/min (by C-G formula based on SCr of 0.97 mg/dL). Liver Function Tests:  Recent Labs Lab 03/25/16 0528 03/25/16 1900 03/27/16 0541 03/28/16 0533 03/29/16 2022  AST 52* 47* 29 31 66*  ALT 27 28 23  32 56  ALKPHOS 37* 40 38 54 87  BILITOT 1.6* 1.0 1.1 0.8 1.1  PROT 5.3* 5.5* 5.2* 5.5* 6.4*  ALBUMIN 3.0* 2.9* 2.5* 2.5* 2.8*   No results for input(s): LIPASE, AMYLASE in the last 168 hours. No results for input(s): AMMONIA in the last 168 hours. Coagulation Profile:  Recent Labs Lab 03/25/16 0528  INR 1.36   Cardiac Enzymes: No results for input(s): CKTOTAL, CKMB, CKMBINDEX, TROPONINI in the last 168 hours. BNP (last 3 results) No results for input(s): PROBNP in the last 8760 hours. HbA1C: No results for input(s): HGBA1C in the last 72 hours. CBG:  Recent Labs Lab 03/24/16 2107 03/29/16 2204  GLUCAP 131* 129*   Lipid Profile: No results for input(s): CHOL, HDL,  LDLCALC, TRIG, CHOLHDL, LDLDIRECT in the last 72 hours. Thyroid Function Tests: No results for input(s): TSH, T4TOTAL, FREET4, T3FREE, THYROIDAB in the last 72 hours. Anemia Panel: No results for input(s): VITAMINB12, FOLATE, FERRITIN, TIBC, IRON, RETICCTPCT in the last 72 hours. Urine analysis:    Component Value Date/Time   COLORURINE AMBER (A) 03/29/2016 2219   APPEARANCEUR HAZY (A)  03/29/2016 2219   LABSPEC 1.019 03/29/2016 2219   PHURINE 5.0 03/29/2016 2219   GLUCOSEU NEGATIVE 03/29/2016 2219   HGBUR MODERATE (A) 03/29/2016 2219   [place] NEGATIVE 03/29/2016 2219   [place] 03/29/2016 2219   PROTEINUR 30 (A) 03/29/2016 2219   NITRITE NEGATIVE 03/29/2016 2219   LEUKOCYTESUR SMALL (A) 03/29/2016 2219   Sepsis Labs: @LABRCNTIP (procalcitonin:4,lacticidven:4)  ) Recent Results (from the past 240 hour(s))  Blood culture (routine x 2)     Status: Abnormal   Collection Time: 03/24/16  6:38 AM  Result Value Ref Range Status   Specimen Description BLOOD RIGHT HAND  Final   Special Requests BOTTLES DRAWN AEROBIC AND ANAEROBIC 5ML  Final   Culture  Setup Time   Final    GRAM POSITIVE COCCI IN CHAINS IN PAIRS ANAEROBIC BOTTLE ONLY CRITICAL RESULT CALLED TO, READ BACK BY AND VERIFIED WITH: BETH GREEN, PHARMD @0050  03/25/16 MKELLY,MLT Performed at [facility], [place] [address] [place], [place] [address]    Culture STREPTOCOCCUS GROUP G (A)  Final   Report Status 03/26/2016 FINAL  Final   Organism ID, Bacteria STREPTOCOCCUS GROUP G  Final      Susceptibility   Streptococcus group g - MIC*    CLINDAMYCIN >=1 RESISTANT Resistant     AMPICILLIN <=0.25 SENSITIVE Sensitive     ERYTHROMYCIN >=8 RESISTANT Resistant     VANCOMYCIN 0.5 SENSITIVE Sensitive     CEFTRIAXONE <=0.12 SENSITIVE Sensitive     LEVOFLOXACIN 0.5 SENSITIVE Sensitive     * STREPTOCOCCUS GROUP G  Blood Culture ID Panel (Reflexed)     Status: Abnormal   Collection Time: 03/24/16  6:38 AM  Result  Value Ref Range Status   Enterococcus species NOT DETECTED NOT DETECTED Final   Listeria monocytogenes NOT DETECTED NOT DETECTED Final   Staphylococcus species NOT DETECTED NOT DETECTED Final   Staphylococcus aureus NOT DETECTED NOT DETECTED Final   Streptococcus species DETECTED (A) NOT DETECTED Final    Comment: Not Enterococcus species, Streptococcus agalactiae, Streptococcus pyogenes, or Streptococcus pneumoniae. CRITICAL RESULT CALLED TO, READ BACK BY AND VERIFIED WITH: BETH GREEN, PHARMD @0050  03/25/16 MKELLY,MLT    Streptococcus agalactiae NOT DETECTED NOT DETECTED Final   Streptococcus pneumoniae NOT DETECTED NOT DETECTED Final   Streptococcus pyogenes NOT DETECTED NOT DETECTED Final   Acinetobacter baumannii NOT DETECTED NOT DETECTED Final   Enterobacteriaceae species NOT DETECTED NOT DETECTED Final   Enterobacter cloacae complex NOT DETECTED NOT DETECTED Final   Escherichia coli NOT DETECTED NOT DETECTED Final   Klebsiella oxytoca NOT DETECTED NOT DETECTED Final   Klebsiella pneumoniae NOT DETECTED NOT DETECTED Final   Proteus species NOT DETECTED NOT DETECTED Final   Serratia marcescens NOT DETECTED NOT DETECTED Final   Haemophilus influenzae NOT DETECTED NOT DETECTED Final   Neisseria meningitidis NOT DETECTED NOT DETECTED Final   Pseudomonas aeruginosa NOT DETECTED NOT DETECTED Final   Candida albicans NOT DETECTED NOT DETECTED Final   Candida glabrata NOT DETECTED NOT DETECTED Final   Candida krusei NOT DETECTED NOT DETECTED Final   Candida parapsilosis NOT DETECTED NOT DETECTED Final   Candida tropicalis NOT DETECTED NOT DETECTED Final    Comment: Performed at [facility], [place] [address] [place], [place] [address]  Urine culture     Status: Abnormal   Collection Time: 03/24/16  6:48 AM  Result Value Ref Range Status   Specimen Description URINE, CLEAN CATCH  Final   Special Requests NONE  Final  Culture MULTIPLE SPECIES PRESENT, SUGGEST RECOLLECTION (A)   Final   Report Status 03/25/2016 FINAL  Final  Blood culture (routine x 2)     Status: None   Collection Time: 03/24/16  6:59 AM  Result Value Ref Range Status   Specimen Description BLOOD LEFT FOREARM  Final   Special Requests BOTTLES DRAWN AEROBIC AND ANAEROBIC 5ML  Final   Culture   Final    NO GROWTH 5 DAYS Performed at [facility], [place] [address], [place], [place] [address]    Report Status 03/29/2016 FINAL  Final  Anaerobic culture     Status: None   Collection Time: 03/24/16  7:20 AM  Result Value Ref Range Status   Specimen Description KNEE LEFT  Final   Special Requests NONE  Final   Culture   Final    NO ANAEROBES ISOLATED Performed at [facility], [place] [address], [place], [place] [address]    Report Status 03/29/2016 FINAL  Final  Body fluid culture     Status: None   Collection Time: 03/24/16  7:30 AM  Result Value Ref Range Status   Specimen Description KNEE LEFT  Final   Special Requests NONE  Final   Gram Stain   Final    WBC PRESENT,BOTH PMN AND MONONUCLEAR NO ORGANISMS SEEN CYTOSPIN SMEAR Gram Stain Report Called to,Read Back By and Verified With: ROSSER,M. RN @0859  ON 2.5.18 BY [facility]    Culture   Final    RARE STREPTOCOCCUS GROUP G Results Called to: RN Lollie Marrow [id number] MLM Performed at [facility], [place] [address] [place], [place] [address]    Report Status 03/28/2016 FINAL  Final   Organism ID, Bacteria STREPTOCOCCUS GROUP G  Final      Susceptibility   Streptococcus group g - MIC*    CLINDAMYCIN >=1 RESISTANT Resistant     AMPICILLIN <=0.25 SENSITIVE Sensitive     ERYTHROMYCIN >=8 RESISTANT Resistant     VANCOMYCIN 0.5 SENSITIVE Sensitive     CEFTRIAXONE <=0.12 SENSITIVE Sensitive     LEVOFLOXACIN 0.5 SENSITIVE Sensitive     * RARE STREPTOCOCCUS GROUP G  Surgical pcr screen     Status: Abnormal   Collection Time: 03/24/16  2:32 PM  Result Value Ref Range Status   MRSA, PCR POSITIVE (A) NEGATIVE Final    Comment:  RESULT CALLED TO, READ BACK BY AND VERIFIED WITH: L.HUDSON RN AT 1950 ON 03/24/16 BY S.VANHOORNE    Staphylococcus aureus POSITIVE (A) NEGATIVE Final    Comment:        The Xpert SA Assay (FDA approved for NASAL specimens in patients over 26 years of age), is one component of a comprehensive surveillance program.  Test performance has been validated by [facility] for patients greater than or equal to 94 year old. It is not intended to diagnose infection nor to guide or monitor treatment.   Aerobic/Anaerobic Culture (surgical/deep wound)     Status: None   Collection Time: 03/25/16  4:33 PM  Result Value Ref Range Status   Specimen Description WOUND LEFT KNEE  Final   Special Requests NONE  Final   Gram Stain   Final    RARE WBC PRESENT, PREDOMINANTLY PMN NO ORGANISMS SEEN    Culture   Final    NO GROWTH 5 DAYS Performed at [facility], [place] [address] [place], [place] [address]    Report Status 03/30/2016 FINAL  Final  Body  fluid culture     Status: None (Preliminary result)   Collection Time: 03/29/16  9:58 PM  Result Value Ref Range Status   Specimen Description KNEE LEFT  Final   Special Requests Normal  Final   Gram Stain   Final    WBC PRESENT,BOTH PMN AND MONONUCLEAR NO ORGANISMS SEEN Gram Stain Report Called to,Read Back By and Verified With: B.JESSEE AT 2309 BY W.SHEA ON 03/29/16    Culture   Final    NO GROWTH 1 DAY Performed at [facility], [place] [address] [place], [place] [address]    Report Status PENDING  Incomplete      Radiology Studies: Dg Knee 2 Views Left  Result Date: 03/29/2016 CLINICAL DATA:  Left knee pain, warmth, and swelling, fever, status post recent left knee arthroscopy EXAM: LEFT KNEE - 1-2 VIEW COMPARISON:  03/24/2016 FINDINGS: No fracture or dislocation is seen. The joint spaces are preserved.  Tiny patellofemoral osteophytes. Moderate suprapatellar knee joint effusion, unchanged. IMPRESSION: Moderate suprapatellar knee  joint effusion, unchanged. Electronically Signed   By: Julian Hy M.D.   On: 03/29/2016 23:08     Scheduled Meds: . allopurinol  300 mg Oral Daily  . atorvastatin  20 mg Oral q1800  . bisacodyl  10 mg Rectal Once  . cefTRIAXone (ROCEPHIN)  IV  2 g Intravenous QHS  . ferrous sulfate  325 mg Oral TID PC  . gabapentin  300 mg Oral TID  . hydrochlorothiazide  25 mg Oral Daily  . irbesartan  300 mg Oral Daily  . levothyroxine  50 mcg Oral QAC breakfast  . omega-3 acid ethyl esters  1 g Oral Daily  . polyethylene glycol  17 g Oral BID  . predniSONE  40 mg Oral Q breakfast  . senna  1 tablet Oral BID  . tamsulosin  0.4 mg Oral Daily   Continuous Infusions: . heparin 1,900 Units/hr (03/31/16 1000)     LOS: 2 days    Time spent: 30 min    Janece Canterbury, MD [facility] Pager [phone]  If 7PM-7AM, please contact night-coverage [email or web address] Password [email or web address] 03/31/2016, 10:50 AM

## 2016-03-31 NOTE — Progress Notes (Signed)
ANTICOAGULATION CONSULT NOTE - Initial Consult  Pharmacy Consult for Heparin Indication: DVT  Allergies  Allergen Reactions  . Oxycontin [Oxycodone Hcl] Nausea And Vomiting and Other (See Comments)    Dizziness   . Valsartan-Hydrochlorothiazide Diarrhea    Patient Measurements: Height: 6' (182.9 cm) Weight: 217 lb 13 oz (98.8 kg) IBW/kg (Calculated) : 77.6 Heparin Dosing Weight: 97.5  Vital Signs: Temp: 98.1 F (36.7 C) (02/11 2352) Temp Source: Oral (02/11 2352) BP: 156/60 (02/12 0000) Pulse Rate: 81 (02/12 0000)  Labs:  Recent Labs  03/29/16 2022 03/30/16 0338 03/31/16 0048  HGB 10.7* 10.0* 10.5*  HCT 31.5* 29.6* 30.1*  PLT 186 161 206  HEPARINUNFRC  --   --  0.24*  CREATININE 1.39* 1.17 0.97    Estimated Creatinine Clearance: 80.1 mL/min (by C-G formula based on SCr of 0.97 mg/dL).   Medical History: Past Medical History:  Diagnosis Date  . Arthritis    spine and hands  . Borderline diabetes   . Colon polyps    Tubular Adenoma   . GERD (gastroesophageal reflux disease)   . Gout   . Helicobacter pylori gastritis   . Hyperlipidemia   . Hypertension   . Hyperthyroidism     Medications:  Scheduled:  . allopurinol  300 mg Oral Daily  . atorvastatin  20 mg Oral q1800  . bisacodyl  10 mg Rectal Once  . cefTRIAXone (ROCEPHIN)  IV  2 g Intravenous QHS  . ferrous sulfate  325 mg Oral TID PC  . gabapentin  300 mg Oral TID  . levothyroxine  50 mcg Oral QAC breakfast  . omega-3 acid ethyl esters  1 g Oral Daily  . polyethylene glycol  17 g Oral BID  . predniSONE  40 mg Oral Q breakfast  . senna  1 tablet Oral BID  . tamsulosin  0.4 mg Oral Daily   Infusions:  . heparin      Assessment: 88 yoM readmitted on 2/10 with acute gout flare superimposed on recent septic arthritis.  He was discharge on 2/9 after admission significant for septic arthritis of knee (s/p aspiration and washout), Streptococcal bacteremia (treated with Ceftriaxone IV then  discharged on Levaquin PO), and AKI due to bladder outlet obstruction which resolved after Foley catheter placement.  Now positive for acute DVT in LLE - pharmacy is consulted to dose Heparin IV.   No PTA anticoagulants.  While inpatient on previous admission, he received Heparin SQ for VTE prophylaxis from 2/5-2/6, then Lovenox (dose adjusted for renal function) on 2/7-2/8.  This admission, he last received Heparin SQ for VTE prophylaxis on 2/11 at 12:00 2/11 SCr 1.17 CBC: Hgb 10, Plt 161 No bleeding reported. Today, 2/12 0048 HL=0.24 below goal, no infusion or bleeding issues per RN    Goal of Therapy:  Heparin level 0.3-0.7 units/ml Monitor platelets by anticoagulation protocol: Yes   Plan:   Increase heparin drip to 1900 units/hr  Heparin level 8 hours after increasing  Daily heparin level and CBC  Continue to monitor for s/s bleeding, thrombosis.  Follow up plans for further procedures or change to Lovenox per MD notes.  Dorrene German 03/31/2016,3:00 AM

## 2016-03-31 NOTE — Progress Notes (Signed)
Pt HR looked irregular on monitor. Pt was asymptomatic, resting comfortably. Performed an EKG, awaiting results. Will continue to monitor.

## 2016-04-01 LAB — CBC
HCT: 31.1 % — ABNORMAL LOW (ref 39.0–52.0)
Hemoglobin: 10.6 g/dL — ABNORMAL LOW (ref 13.0–17.0)
MCH: 30.6 pg (ref 26.0–34.0)
MCHC: 34.1 g/dL (ref 30.0–36.0)
MCV: 89.9 fL (ref 78.0–100.0)
PLATELETS: 309 10*3/uL (ref 150–400)
RBC: 3.46 MIL/uL — ABNORMAL LOW (ref 4.22–5.81)
RDW: 13.8 % (ref 11.5–15.5)
WBC: 13.8 10*3/uL — ABNORMAL HIGH (ref 4.0–10.5)

## 2016-04-01 LAB — BASIC METABOLIC PANEL
Anion gap: 8 (ref 5–15)
BUN: 30 mg/dL — ABNORMAL HIGH (ref 6–20)
CHLORIDE: 101 mmol/L (ref 101–111)
CO2: 25 mmol/L (ref 22–32)
Calcium: 8.7 mg/dL — ABNORMAL LOW (ref 8.9–10.3)
Creatinine, Ser: 0.98 mg/dL (ref 0.61–1.24)
GFR calc non Af Amer: >60 mL/min (ref >60)
Glucose, Bld: 231 mg/dL — ABNORMAL HIGH (ref 65–99)
Potassium: 4.4 mmol/L (ref 3.5–5.1)
Sodium: 134 mmol/L — ABNORMAL LOW (ref 135–145)

## 2016-04-01 LAB — GLUCOSE, CAPILLARY
GLUCOSE-CAPILLARY: 210 mg/dL — AB (ref 65–99)
Glucose-Capillary: 265 mg/dL — ABNORMAL HIGH (ref 65–99)

## 2016-04-01 LAB — HEPARIN LEVEL (UNFRACTIONATED): HEPARIN UNFRACTIONATED: 0.68 [IU]/mL (ref 0.30–0.70)

## 2016-04-01 MED ORDER — APIXABAN 5 MG PO TABS: ORAL_TABLET | ORAL | 0 refills | Status: AC

## 2016-04-01 MED ORDER — PREDNISONE 10 MG PO TABS: ORAL_TABLET | ORAL | 0 refills | Status: DC

## 2016-04-01 MED ORDER — APIXABAN 5 MG PO TABS
10.0000 mg | ORAL_TABLET | Freq: Two times a day (BID) | ORAL | Status: DC
  Administered 2016-04-01: 10 mg via ORAL
  Filled 2016-04-01: qty 2

## 2016-04-01 MED ORDER — APIXABAN 5 MG PO TABS: 5.0000 mg | ORAL_TABLET | Freq: Two times a day (BID) | ORAL | Status: DC

## 2016-04-01 MED ORDER — PREDNISONE 5 MG PO TABS: 30.0000 mg | ORAL_TABLET | Freq: Every day | ORAL | Status: DC

## 2016-04-01 MED ORDER — POLYETHYLENE GLYCOL 3350 17 G PO PACK: 17.0000 g | PACK | Freq: Two times a day (BID) | ORAL | 0 refills | Status: DC

## 2016-04-01 MED ORDER — HEPARIN (PORCINE) IN NACL 100-0.45 UNIT/ML-% IJ SOLN
1800.0000 [IU]/h | INTRAMUSCULAR | Status: DC
  Administered 2016-04-01: 1800 [IU]/h via INTRAVENOUS
  Filled 2016-04-01: qty 250

## 2016-04-01 NOTE — Care Management Note (Signed)
Case Management Note  Patient Details  Name: ARA REAGER MRN: [id number] Date of Birth: 1940-07-26  Subjective/Objective:  Pt admitted with acute gout pain                  Action/Plan: Plan to D/C home with [facility]. MD please call medications to [facility] at [phone] or fax [phone] or send by electronic.   Expected Discharge Date:  04/02/2016               Expected Discharge Plan:  [place]  In-House Referral:  NA  Discharge planning Services  CM Consult  Post Acute Care Choice:    Choice offered to:     DME Arranged:  3-N-1, [place] (given one week also) DME Agency:  [facility]. (DME already given)  HH Arranged:  RN, PT, OT HH Agency:  [facility] (now [facility])  Status of Service:  In process, will continue to follow  If discussed at Long Length of Stay Meetings, dates discussed:    Additional CommentsPurcell Mouton, RN 04/01/2016, 2:19 PM

## 2016-04-01 NOTE — Discharge Instructions (Signed)
Information on my medicine - ELIQUIS (apixaban)  This medication education was reviewed with me or my healthcare representative as part of my discharge preparation.  The pharmacist that spoke with me during my hospital stay was:  [place] Why was Eliquis prescribed for you? Eliquis was prescribed to treat blood clots that may have been found in the veins of your legs (deep vein thrombosis) or in your lungs (pulmonary embolism) and to reduce the risk of them occurring again.  What do You need to know about Eliquis ? The starting dose is 10 mg (two 5 mg tablets) taken TWICE daily for the FIRST SEVEN (7) DAYS, then on Tuesday, February 20th the dose is reduced to ONE 5 mg tablet taken TWICE daily.  Eliquis may be taken with or without food.   Try to take the dose about the same time in the morning and in the evening. If you have difficulty swallowing the tablet whole please discuss with your pharmacist how to take the medication safely.  Take Eliquis exactly as prescribed and DO NOT stop taking Eliquis without talking to the doctor who prescribed the medication.  Stopping may increase your risk of developing a new blood clot.  Refill your prescription before you run out.  After discharge, you should have regular check-up appointments with your healthcare provider that is prescribing your Eliquis.    What do you do if you miss a dose? If a dose of ELIQUIS is not taken at the scheduled time, take it as soon as possible on the same day and twice-daily administration should be resumed. The dose should not be doubled to make up for a missed dose.  Important Safety Information A possible side effect of Eliquis is bleeding. You should call your healthcare provider right away if you experience any of the following: ? Bleeding from an injury or your nose that does not stop. ? Unusual colored urine (red or dark brown) or unusual colored stools (red or black). ? Unusual bruising for unknown  reasons. ? A serious fall or if you hit your head (even if there is no bleeding).  Some medicines may interact with Eliquis and might increase your risk of bleeding or clotting while on Eliquis. To help avoid this, consult your healthcare provider or pharmacist prior to using any new prescription or non-prescription medications, including herbals, vitamins, non-steroidal anti-inflammatory drugs (NSAIDs) and supplements.  This website has more information on Eliquis (apixaban): http://www.eliquis.com/eliquis/home

## 2016-04-01 NOTE — Progress Notes (Addendum)
Medications need to be sent to [facility] in [place], [place]. Both Pt and wife state this information was explained to MD was to call [facility] with medications needed now. Also can  fax [phone] or send by electronic to [place].

## 2016-04-01 NOTE — Progress Notes (Signed)
Physical Therapy Treatment Patient Details Name: Zachary Mcdonald MRN: [id number] DOB: 08-01-1940 Today's Date: 04/01/2016    History of Present Illness 76 y.o. male with past medical history of arthritis, gout, GERD, hypertension, chronic lower back pain, he presents to ED secondary to left knee pain, chills, patient reports is been having worsening lower back pain over last few weeks recently seen by Dr. Dellis Filbert, given steroid taper. Presented with left knee pain where he fell due to his knee giving out and was unable to get up from the floor and now s/p L knee arthroscopic irrigation debridement 03/25/16 and again 03/28/16. Returned to [facility] 2/10 from MD office.    PT Comments    The patient is encouraged to slow down, test the left knee in standing before  Walking. Instructed in gentle AAROM left knee to encourage knee extension. Plans DC today.  Follow Up Recommendations  Home health PT;Supervision for mobility/OOB     Equipment Recommendations  None recommended by PT    Recommendations for Other Services       Precautions / Restrictions Precautions Precautions: Fall Precaution Comments: Left leg DVT, on Heparin Restrictions Weight Bearing Restrictions: No Other Position/Activity Restrictions: WBAT    Mobility  Bed Mobility               General bed mobility comments: NT  Transfers Overall transfer level: Needs assistance Equipment used: Rolling walker (2 wheeled) Transfers: Sit to/from Stand Sit to Stand: Min guard;From elevated surface         General transfer comment: verbal cues for UE and LE positioning and safe technique, increased time and effort  Ambulation/Gait Ambulation/Gait assistance: Min guard Ambulation Distance (Feet): 90 Feet Assistive device: Rolling walker (2 wheeled) Gait Pattern/deviations: Step-through pattern     General Gait Details: pt had R AFO with him today so donned shoes prior to ambulation, verbal cues for sequence,  cues  for safety abnd to slow down.,RW positioning, step length,    Stairs            Wheelchair Mobility    Modified Rankin (Stroke Patients Only)       Balance                                    Cognition   Behavior During Therapy: Impulsive                        Exercises      General Comments        Pertinent Vitals/Pain Pain Assessment: No/denies pain    Home Living Family/patient expects to be discharged to:: Private residence Living Arrangements: Spouse/significant other   Type of Home: House Home Access: Ramped entrance   Home Layout: One level Home Equipment: None;Shower seat - built in      Prior Function Level of Independence: Needs assistance  Gait / Transfers Assistance Needed: ambulated with RW       PT Goals (current goals can now be found in the care plan section) Acute Rehab PT Goals Patient Stated Goal: to walk PT Goal Formulation: With patient Time For Goal Achievement: 04/08/16 Potential to Achieve Goals: Good Progress towards PT goals: Progressing toward goals    Frequency    Min 5X/week      PT Plan Current plan remains appropriate    Co-evaluation  End of Session Equipment Utilized During Treatment: Gait belt Activity Tolerance: Patient tolerated treatment well Patient left: in bed;with call bell/phone within reach;with family/visitor present     Time: 1351-1403 PT Time Calculation (min) (ACUTE ONLY): 12 min  Charges:  $Gait Training: 8-22 mins                    G Codes:      Claretha Cooper 04/01/2016, 2:09 PM Tresa Endo PT [phone]

## 2016-04-01 NOTE — Discharge Summary (Signed)
Physician Discharge Summary  Zachary Mcdonald [id number] DOB: 07-10-1940 DOA: 03/29/2016  PCP: Tivis Ringer, MD  Admit date: 03/29/2016 Discharge date: 04/01/2016  Admitted From: home  Disposition:  home  Recommendations for Outpatient Follow-up:  1. Follow up with [facility] as needed 2. Follow up with PCP in 1-2 weeks for f/u with DVT and gout flare 3. Follow up with Orthopedic surgery in 1-2 weeks for reevaluation of left knee septic arthritis 4. Follow up with Dr. Megan Salon on 04/16/2016 5. Given levofloxacin to continue for another 7 days 6. Prednisone taper for gout flare  Home Health:  PT, OT, RN Equipment/Devices:    Discharge Condition:  Stable, improved CODE STATUS:  Full code  Diet recommendation:  Healthy heart   Brief/Interim Summary:  The patient is a 76 year old male with history of hypertension, hyperlipidemia, hypothyroidism, gout, borderline diabetes who was recently admitted for septic arthritis of the left knee with bacteremia due to group G strep.  He was discharged home but his pharmacy did not receive the prescription for levofloxacin.  His knee became increasingly painful and his fevers returned.  He returned to the hospital and underwent urgent arthroscopy.  His repeat fluid demonstrated crystals.  He likely developed a gout flare secondary to his septic arthritis.  He was started on prednisone and his inflammation has improved.  He will need an additional 7 days of antibiotics and prednisone taper for his gout.    Incidentally, during his recent admission, he had urinary retention requiring catheterization.  Due to several unsuccessful attempts by nursing to place a catheter, Urology was consulted and placed a coude catheter.  He was started on flomax.  We attempted a voiding trial and he successfully urinated after catheter removal.  He should follow up with urology routinely for BPH.    Discharge Diagnoses:  Principal Problem:   Acute gout Active  Problems:   Hypertension   Septic arthritis of knee, left (HCC)   Streptococcal bacteremia   Bladder outflow obstruction   Acute deep vein thrombosis (DVT) of left lower extremity (HCC)  Septic arthritis of the left knee due to group G strep with group G strep bacteremia diagnosed on 2/5 with superimposed gout flare -  Given ceftriaxone during hospitalization -  Appreciate orthopedics assistance with washout on 2/11 -  Culture:  No growth, no organisms on gram stain -  Drain removed on [id number] without complication -  Appreciate infectious disease assistance -  Continue allopurinol -  Taper Prednisone over a week -  PT recommended home health services  Left lower extremity DVT, calf -  Started on heparin gtt post procedure -  No evidence of hemorrhage over 2 days -  Transitioned to apixaban at discharge  Bladder outflow obstruction due to BPH -  Continue flomax -  Successfully passed voiding trial on 2/13 -  F/u with urology  Hypertension, blood pressures have risen -  Resumed ARB and HCTZ  Bigeminy and trigeminy with bradycardia in the 30s-40s, asymptomatic -  No atrial fibrillation on telemetry -  Continue telemetry  -  Not on AV nodal blocking medications  Constipation -  Continue miralax and senna  Hyperglycemia from steroids -  F/u with PCP as outpatient   Discharge Instructions  Discharge Instructions    Call MD for:  difficulty breathing, headache or visual disturbances    Complete by:  As directed    Call MD for:  extreme fatigue    Complete by:  As directed  Call MD for:  hives    Complete by:  As directed    Call MD for:  persistant dizziness or light-headedness    Complete by:  As directed    Call MD for:  persistant nausea and vomiting    Complete by:  As directed    Call MD for:  redness, tenderness, or signs of infection (pain, swelling, redness, odor or green/yellow discharge around incision site)    Complete by:  As directed    Call MD for:   severe uncontrolled pain    Complete by:  As directed    Call MD for:  temperature >100.4    Complete by:  As directed    Diet - low sodium heart healthy    Complete by:  As directed    Increase activity slowly    Complete by:  As directed        Medication List    STOP taking these medications   atorvastatin 20 MG tablet Commonly known as:  LIPITOR   diltiazem 420 MG 24 hr capsule Commonly known as:  TIAZAC     TAKE these medications   acetaminophen 500 MG tablet Commonly known as:  TYLENOL Take 1,000-1,500 mg by mouth every 6 (six) hours as needed for mild pain, moderate pain or fever.   allopurinol 300 MG tablet Commonly known as:  ZYLOPRIM Take 300 mg by mouth every morning.   apixaban 5 MG Tabs tablet Commonly known as:  ELIQUIS Take two tabs in the morning and two tabs at night through 2/19, then take 1 tab morning and night thereafter for three months.   BENICAR HCT 40-25 MG tablet Generic drug:  olmesartan-hydrochlorothiazide Take 1 tablet by mouth every morning.   bisacodyl 10 MG suppository Commonly known as:  DULCOLAX Place 1 suppository (10 mg total) rectally daily as needed for moderate constipation.   bisacodyl 5 MG EC tablet Commonly known as:  DULCOLAX Take 1 tablet (5 mg total) by mouth daily as needed for moderate constipation.   bismuth subsalicylate 99991111 MG chewable tablet Commonly known as:  PEPTO BISMOL Chew 1 tablet (262 mg total) by mouth 4 (four) times daily -  before meals and at bedtime. What changed:  when to take this  reasons to take this   ferrous sulfate 325 (65 FE) MG tablet Take 1 tablet (325 mg total) by mouth 3 (three) times daily after meals.   fish oil-omega-3 fatty acids 1000 MG capsule Take 2 g by mouth every morning.   gabapentin 300 MG capsule Commonly known as:  NEURONTIN Take 300 mg by mouth 3 (three) times daily.   levofloxacin 500 MG tablet Commonly known as:  LEVAQUIN Take 1 tablet (500 mg total) by mouth  daily.   levothyroxine 50 MCG tablet Commonly known as:  SYNTHROID, LEVOTHROID Take 50 mcg by mouth daily before breakfast.   oxyCODONE 5 MG immediate release tablet Commonly known as:  Oxy IR/ROXICODONE Take 1-2 tablets (5-10 mg total) by mouth every 4 (four) hours as needed for severe pain.   polyethylene glycol packet Commonly known as:  MIRALAX / GLYCOLAX Take 17 g by mouth 2 (two) times daily. What changed:  when to take this  reasons to take this   predniSONE 10 MG tablet Commonly known as:  DELTASONE Take 3 tabs daily for two days, then 2 tabs daily for 2 days, then 1 tab daily for 2 days, then stop   senna 8.6 MG Tabs tablet Commonly known as:  SENOKOT Take 1 tablet (8.6 mg total) by mouth 2 (two) times daily.   simvastatin 40 MG tablet Commonly known as:  ZOCOR Take 40 mg by mouth every morning.   tamsulosin 0.4 MG Caps capsule Commonly known as:  FLOMAX Take 0.4 mg by mouth every morning.      Follow-up Information    Tivis Ringer, MD. Schedule an appointment as soon as possible for a visit in 2 week(s).   Specialty:  Internal Medicine Why:  follow up of DVT/anticoagulation and gout Contact information: [address] [place] [address] [phone]        GIOFFRE,RONALD A, MD. Schedule an appointment as soon as possible for a visit in 2 week(s).   Specialty:  Orthopedic Surgery Contact information: [address] [place] [address] [phone]        Michel Bickers, MD. Schedule an appointment as soon as possible for a visit on 04/16/2016.   Specialty:  Infectious Diseases Contact information: [address] [place] [address] [phone]        [facility]. Schedule an appointment as soon as possible for a visit.   Why:  as needed for difficulty urinating Contact information: [place] [phone]         Allergies   Allergen Reactions  . Oxycontin [Oxycodone Hcl] Nausea And Vomiting and Other (See Comments)    Dizziness   . Valsartan-Hydrochlorothiazide Diarrhea    Consultations:  Orthopedic surgery, Dr. Dellis Filbert Beane/Dr. Gioffre  Infectious Disease, Dr. Megan Salon   Procedures/Studies: Dg Knee 2 Views Left  Result Date: 03/29/2016 CLINICAL DATA:  Left knee pain, warmth, and swelling, fever, status post recent left knee arthroscopy EXAM: LEFT KNEE - 1-2 VIEW COMPARISON:  03/24/2016 FINDINGS: No fracture or dislocation is seen. The joint spaces are preserved.  Tiny patellofemoral osteophytes. Moderate suprapatellar knee joint effusion, unchanged. IMPRESSION: Moderate suprapatellar knee joint effusion, unchanged. Electronically Signed   By: Julian Hy M.D.   On: 03/29/2016 23:08   Mr Lumbar Spine W Wo Contrast  Result Date: 03/24/2016 CLINICAL DATA:  76 year old male with septic knee joint and suspected bacteremia. Fever and chills. Severe low back pain, maximal in the sacral/buttock area. Prior lumbar spine surgery. Initial encounter. EXAM: MRI LUMBAR SPINE WITHOUT AND WITH CONTRAST TECHNIQUE: Multiplanar and multiecho pulse sequences of the lumbar spine were obtained without and with intravenous contrast. CONTRAST:  56mL MULTIHANCE GADOBENATE DIMEGLUMINE 529 MG/ML IV SOLN COMPARISON:  CT lumbar myelogram 06/13/2013. FINDINGS: Segmentation: Normal as demonstrated on the comparison CT myelogram. There is a vestigial S1-S2 disc space, but the S1 level is fully sacralized. Alignment: Mild dextroconvex lumbar scoliosis. Overall stable vertebral height and alignment since 2015. Vertebrae: Chronic degenerative endplate changes at 624THL and L5-S1. Trace if any associated marrow edema appears degenerative in nature. No abnormal enhancement identified. No evidence of lumbar osteomyelitis. Lower field of view was utilized in order to visualize the sacrum. Additional coronal STIR imaging of the sacrum was also  obtained (series 7). Sacral bone marrow signal is normal and both SI joints are normal. Partially visible iliac wings and bilateral hip joints also appear normal. Conus medullaris: Extends to the L1-L2 level and appears normal. No abnormal intradural enhancement. Paraspinal and other soft tissues: Mild distention of the urinary bladder. Mild ectasia of the seminal vesicles. Nonspecific perinephric stranding which can be age or renal insufficiency related. The lumbar posterior paraspinal muscles and soft tissues are normal. Posterior  to the lower sacrum there is bilateral but slightly left worse than right increased STIR signal and enhancement in the medial gluteal musculature (series 7, image 12). No associated intramuscular abscess. The overlying subcutaneous soft tissues are normal except for lumbosacral junction level subcutaneous edema which is nonspecific but probably unrelated. Disc levels: Lumbar spine degeneration notable for the following. L2-L3: Severe chronic disc space loss with vacuum disc. Circumferential disc osteophyte complex with posterior element hypertrophy. Chronic but increased moderate to severe spinal and left lateral recess stenosis (series 5, image 12). Mild to moderate left L2 foraminal stenosis. L3-L4: Mild multifactorial spinal stenosis related to circumferential disc bulge and moderate posterior element hypertrophy. L5-S1: Chronic postoperative changes to the right lamina. Chronic severe disc space loss and vacuum disc. Circumferential disc osteophyte complex broad-based posterior component. Severe residual facet hypertrophy with trace facet joint fluid. No spinal stenosis following decompression. Architectural distortion at the right lateral recess. Up to mild left lateral recess stenosis. Moderate to severe bilateral L5 foraminal stenosis appears primarily related to disc and endplate spurring and unchanged since 2015. IMPRESSION: 1. Negative for lumbosacral discitis or osteomyelitis,  and normal SI joints. 2. Acute myositis of the bilateral medial gluteal muscles which could be infectious or inflammatory. No associated intramuscular abscess. 3. 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 Moderate to severe spinal and left lateral recess stenosis at L2-L3. Electronically Signed   By: Genevie Ann M.D.   On: 03/24/2016 12:29   Dg Chest Port 1 View  Result Date: 03/24/2016 CLINICAL DATA:  Fever and chills, tachycardia, septic left knee joint. EXAM: PORTABLE CHEST 1 VIEW COMPARISON:  Portable chest x-ray of June 14, 2015 FINDINGS: The lungs are adequately inflated. There is no focal infiltrate. There is no pleural effusion. The heart is top-normal in size. The pulmonary vascularity is normal. There is calcification in the wall of the aortic arch. The trachea is midline. There is no pleural effusion. The bony thorax exhibits no acute abnormality. IMPRESSION: No acute cardiopulmonary abnormality. Thoracic aortic atherosclerosis. Electronically Signed   By: David  Martinique M.D.   On: 03/24/2016 10:48   Dg Knee Complete 4 Views Left  Result Date: 03/24/2016 CLINICAL DATA:  Patient fell. Popliteal pain. Unable to bear weight. LEFT leg gave out. EXAM: LEFT KNEE - COMPLETE 4+ VIEW COMPARISON:  None. FINDINGS: No fracture is seen. There is a moderate suprapatellar bursa effusion. Patellar spurs are noted. IMPRESSION: Moderate effusion.  No fracture. Electronically Signed   By: Staci Righter M.D.   On: 03/24/2016 06:37   Subjective: Feeling well.  Was able to ambulate in the halls yesterday with minimal pain in the left knee.  Would like to try a voiding trial because he does not want to return for his Urology appointment tomorrow.  No obvious bleeding.    Discharge Exam: Vitals:   03/31/16 2122 04/01/16 0511  BP: 126/87 137/77  Pulse: 65 67  Resp: 20 20  Temp: 98.3 F (36.8 C) 98 F (36.7 C)   Vitals:   03/31/16 1000 03/31/16 1232 03/31/16  2122 04/01/16 0511  BP: 122/65 130/62 126/87 137/77  Pulse:  (!) 53 65 67  Resp: 18 20 20 20   Temp:  98.2 F (36.8 C) 98.3 F (36.8 C) 98 F (36.7 C)  TempSrc:  Oral Oral Oral  SpO2: 94% 98% 98% 98%  Weight:      Height:        General exam:  Adult Male.  No  acute distress.  HEENT:  NCAT, MMM Respiratory system: Clear to auscultation bilaterally Cardiovascular system: IRRR, normal S1/S2. No murmurs, rubs, gallops or clicks.  Warm extremities Gastrointestinal system: Normal active bowel sounds, soft, nondistended, nontender. MSK:  Normal tone and bulk, 1+ pitting bilateral lower extremity pitting edema.  moderate left knee effusion. 2+ pedal pulse left foot, < 2 sec CR,   Neuro:  Right foot drop, able to wiggle toes on left foot    The results of significant diagnostics from this hospitalization (including imaging, microbiology, ancillary and laboratory) are listed below for reference.     Microbiology: Recent Results (from the past 240 hour(s))  Blood culture (routine x 2)     Status: Abnormal   Collection Time: 03/24/16  6:38 AM  Result Value Ref Range Status   Specimen Description BLOOD RIGHT HAND  Final   Special Requests BOTTLES DRAWN AEROBIC AND ANAEROBIC 5ML  Final   Culture  Setup Time   Final    GRAM POSITIVE COCCI IN CHAINS IN PAIRS ANAEROBIC BOTTLE ONLY CRITICAL RESULT CALLED TO, READ BACK BY AND VERIFIED WITH: BETH GREEN, PHARMD @0050  03/25/16 MKELLY,MLT Performed at [facility], [address] [place], [place] [address]    Culture STREPTOCOCCUS GROUP G (A)  Final   Report Status 03/26/2016 FINAL  Final   Organism ID, Bacteria STREPTOCOCCUS GROUP G  Final      Susceptibility   Streptococcus group g - MIC*    CLINDAMYCIN >=1 RESISTANT Resistant     AMPICILLIN <=0.25 SENSITIVE Sensitive     ERYTHROMYCIN >=8 RESISTANT Resistant     VANCOMYCIN 0.5 SENSITIVE Sensitive     CEFTRIAXONE <=0.12 SENSITIVE Sensitive     LEVOFLOXACIN 0.5 SENSITIVE Sensitive      * STREPTOCOCCUS GROUP G  Blood Culture ID Panel (Reflexed)     Status: Abnormal   Collection Time: 03/24/16  6:38 AM  Result Value Ref Range Status   Enterococcus species NOT DETECTED NOT DETECTED Final   Listeria monocytogenes NOT DETECTED NOT DETECTED Final   Staphylococcus species NOT DETECTED NOT DETECTED Final   Staphylococcus aureus NOT DETECTED NOT DETECTED Final   Streptococcus species DETECTED (A) NOT DETECTED Final    Comment: Not Enterococcus species, Streptococcus agalactiae, Streptococcus pyogenes, or Streptococcus pneumoniae. CRITICAL RESULT CALLED TO, READ BACK BY AND VERIFIED WITH: BETH GREEN, PHARMD @0050  03/25/16 MKELLY,MLT    Streptococcus agalactiae NOT DETECTED NOT DETECTED Final   Streptococcus pneumoniae NOT DETECTED NOT DETECTED Final   Streptococcus pyogenes NOT DETECTED NOT DETECTED Final   Acinetobacter baumannii NOT DETECTED NOT DETECTED Final   Enterobacteriaceae species NOT DETECTED NOT DETECTED Final   Enterobacter cloacae complex NOT DETECTED NOT DETECTED Final   Escherichia coli NOT DETECTED NOT DETECTED Final   Klebsiella oxytoca NOT DETECTED NOT DETECTED Final   Klebsiella pneumoniae NOT DETECTED NOT DETECTED Final   Proteus species NOT DETECTED NOT DETECTED Final   Serratia marcescens NOT DETECTED NOT DETECTED Final   Haemophilus influenzae NOT DETECTED NOT DETECTED Final   Neisseria meningitidis NOT DETECTED NOT DETECTED Final   Pseudomonas aeruginosa NOT DETECTED NOT DETECTED Final   Candida albicans NOT DETECTED NOT DETECTED Final   Candida glabrata NOT DETECTED NOT DETECTED Final   Candida krusei NOT DETECTED NOT DETECTED Final   Candida parapsilosis NOT DETECTED NOT DETECTED Final   Candida tropicalis NOT DETECTED NOT DETECTED Final    Comment: Performed at [facility], [place] [address] [place], [place] [address]  Urine culture  Status: Abnormal   Collection Time: 03/24/16  6:48 AM  Result Value Ref Range Status   Specimen  Description URINE, CLEAN CATCH  Final   Special Requests NONE  Final   Culture MULTIPLE SPECIES PRESENT, SUGGEST RECOLLECTION (A)  Final   Report Status 03/25/2016 FINAL  Final  Blood culture (routine x 2)     Status: None   Collection Time: 03/24/16  6:59 AM  Result Value Ref Range Status   Specimen Description BLOOD LEFT FOREARM  Final   Special Requests BOTTLES DRAWN AEROBIC AND ANAEROBIC 5ML  Final   Culture   Final    NO GROWTH 5 DAYS Performed at [facility], [place] [address] [place], [place] [address]    Report Status 03/29/2016 FINAL  Final  Anaerobic culture     Status: None   Collection Time: 03/24/16  7:20 AM  Result Value Ref Range Status   Specimen Description KNEE LEFT  Final   Special Requests NONE  Final   Culture   Final    NO ANAEROBES ISOLATED Performed at [facility], [place] [address], [place], [place] [address]    Report Status 03/29/2016 FINAL  Final  Body fluid culture     Status: None   Collection Time: 03/24/16  7:30 AM  Result Value Ref Range Status   Specimen Description KNEE LEFT  Final   Special Requests NONE  Final   Gram Stain   Final    WBC PRESENT,BOTH PMN AND MONONUCLEAR NO ORGANISMS SEEN CYTOSPIN SMEAR Gram Stain Report Called to,Read Back By and Verified With: ROSSER,M. RN @0859  ON 2.5.18 BY [facility]    Culture   Final    RARE STREPTOCOCCUS GROUP G Results Called to: RN Lollie Marrow [id number] MLM Performed at [facility], [place] [address], [place], [place] [address]    Report Status 03/28/2016 FINAL  Final   Organism ID, Bacteria STREPTOCOCCUS GROUP G  Final      Susceptibility   Streptococcus group g - MIC*    CLINDAMYCIN >=1 RESISTANT Resistant     AMPICILLIN <=0.25 SENSITIVE Sensitive     ERYTHROMYCIN >=8 RESISTANT Resistant     VANCOMYCIN 0.5 SENSITIVE Sensitive     CEFTRIAXONE <=0.12 SENSITIVE Sensitive     LEVOFLOXACIN 0.5 SENSITIVE Sensitive     * RARE STREPTOCOCCUS GROUP G  Surgical pcr screen     Status:  Abnormal   Collection Time: 03/24/16  2:32 PM  Result Value Ref Range Status   MRSA, PCR POSITIVE (A) NEGATIVE Final    Comment: RESULT CALLED TO, READ BACK BY AND VERIFIED WITH: L.HUDSON RN AT 1950 ON 03/24/16 BY S.VANHOORNE    Staphylococcus aureus POSITIVE (A) NEGATIVE Final    Comment:        The Xpert SA Assay (FDA approved for NASAL specimens in patients over 60 years of age), is one component of a comprehensive surveillance program.  Test performance has been validated by [facility] for patients greater than or equal to 98 year old. It is not intended to diagnose infection nor to guide or monitor treatment.   Aerobic/Anaerobic Culture (surgical/deep wound)     Status: None   Collection Time: 03/25/16  4:33 PM  Result Value Ref Range Status   Specimen Description WOUND LEFT KNEE  Final   Special Requests NONE  Final   Gram Stain   Final    RARE WBC PRESENT, PREDOMINANTLY PMN NO ORGANISMS SEEN    Culture  Final    No growth aerobically or anaerobically. Performed at [facility], [place] [address], [place], [place] [address]    Report Status 03/31/2016 FINAL  Final  Culture, blood (routine x 2)     Status: None (Preliminary result)   Collection Time: 03/29/16  9:53 PM  Result Value Ref Range Status   Specimen Description BLOOD LEFT ARM  Final   Special Requests BOTTLES DRAWN AEROBIC AND ANAEROBIC 5CC EACH  Final   Culture   Final    NO GROWTH 2 DAYS Performed at [facility], [place] [address] [place], [place] [address]    Report Status PENDING  Incomplete  Culture, blood (routine x 2)     Status: None (Preliminary result)   Collection Time: 03/29/16  9:53 PM  Result Value Ref Range Status   Specimen Description BLOOD RIGHT ARM  Final   Special Requests BOTTLES DRAWN AEROBIC AND ANAEROBIC 5CC EACH  Final   Culture   Final    NO GROWTH 2 DAYS Performed at [facility], [place] [address] [place], [place] [address]    Report Status PENDING   Incomplete  Body fluid culture     Status: None (Preliminary result)   Collection Time: 03/29/16  9:58 PM  Result Value Ref Range Status   Specimen Description KNEE LEFT  Final   Special Requests Normal  Final   Gram Stain   Final    WBC PRESENT,BOTH PMN AND MONONUCLEAR NO ORGANISMS SEEN Gram Stain Report Called to,Read Back By and Verified With: B.JESSEE AT 2309 BY W.SHEA ON 03/29/16    Culture   Final    NO GROWTH 2 DAYS Performed at [facility], [place] [address], [place], [place] [address]    Report Status PENDING  Incomplete     Labs: BNP (last 3 results) No results for input(s): BNP in the last 8760 hours. Basic Metabolic Panel:  Recent Labs Lab 03/28/16 0533 03/29/16 2022 03/30/16 0338 03/31/16 0048 04/01/16 0515  NA 140 140 142 136 134*  K 4.1 4.0 4.0 4.4 4.4  CL 112* 109 110 107 101  CO2 22 24 23 22 25   GLUCOSE 146* 145* 135* 278* 231*  BUN 30* 30* 27* 28* 30*  CREATININE 1.23 1.39* 1.17 0.97 0.98  CALCIUM 8.5* 8.7* 8.2* 8.3* 8.7*   Liver Function Tests:  Recent Labs Lab 03/25/16 1900 03/27/16 0541 03/28/16 0533 03/29/16 2022  AST 47* 29 31 66*  ALT 28 23 32 56  ALKPHOS 40 38 54 87  BILITOT 1.0 1.1 0.8 1.1  PROT 5.5* 5.2* 5.5* 6.4*  ALBUMIN 2.9* 2.5* 2.5* 2.8*   No results for input(s): LIPASE, AMYLASE in the last 168 hours. No results for input(s): AMMONIA in the last 168 hours. CBC:  Recent Labs Lab 03/26/16 1721 03/29/16 2022 03/30/16 0338 03/31/16 0048 04/01/16 0515  WBC 9.3 12.3* 10.0 11.2* 13.8*  NEUTROABS 6.7 9.7*  --   --   --   HGB 11.7* 10.7* 10.0* 10.5* 10.6*  HCT 33.9* 31.5* 29.6* 30.1* 31.1*  MCV 92.4 91.6 93.4 91.5 89.9  PLT 122* 186 161 206 309   Cardiac Enzymes: No results for input(s): CKTOTAL, CKMB, CKMBINDEX, TROPONINI in the last 168 hours. BNP: Invalid input(s): POCBNP CBG:  Recent Labs Lab 03/31/16 1241 03/31/16 1731 03/31/16 2116 04/01/16 0808 04/01/16 1158  GLUCAP 220* 224* 293* 210* 265*    D-Dimer No results for input(s): DDIMER in the last 72 hours. Hgb  A1c No results for input(s): HGBA1C in the last 72 hours. Lipid Profile No results for input(s): CHOL, HDL, LDLCALC, TRIG, CHOLHDL, LDLDIRECT in the last 72 hours. Thyroid function studies No results for input(s): TSH, T4TOTAL, T3FREE, THYROIDAB in the last 72 hours.  Invalid input(s): FREET3 Anemia work up No results for input(s): VITAMINB12, FOLATE, FERRITIN, TIBC, IRON, RETICCTPCT in the last 72 hours. Urinalysis    Component Value Date/Time   COLORURINE AMBER (A) 03/29/2016 2219   APPEARANCEUR HAZY (A) 03/29/2016 2219   LABSPEC 1.019 03/29/2016 2219   PHURINE 5.0 03/29/2016 2219   GLUCOSEU NEGATIVE 03/29/2016 2219   HGBUR MODERATE (A) 03/29/2016 2219   [place] NEGATIVE 03/29/2016 2219   [place] 03/29/2016 2219   PROTEINUR 30 (A) 03/29/2016 2219   NITRITE NEGATIVE 03/29/2016 2219   LEUKOCYTESUR SMALL (A) 03/29/2016 2219   Sepsis Labs Invalid input(s): PROCALCITONIN,  WBC,  LACTICIDVEN   Time coordinating discharge: Over 30 minutes  SIGNED:   Janece Canterbury, MD  [facility] 04/01/2016, 4:26 PM Pager   If 7PM-7AM, please contact night-coverage [email or web address] Password [email or web address]

## 2016-04-01 NOTE — Progress Notes (Signed)
Subjective: 2 Days Post-Op Procedure(s) (LRB): ARTHROSCOPY KNEE (Left) Patient reports pain as 1 on 0-10 scale. Dressing changed and knee looks better. No Growth in regards to last set of cultures. He is afebrile and WBC is elevated but he has a Foley in and is on Prednisone.He will need to go on Lovenox. Dr. Megan Salon made the point that Coumadin interacts with Levaquin. Will get up with PT. Case discussed with his wife. Will call Urology to have [facility] removed and then observe for urinary retention.  Objective: Vital signs in last 24 hours: Temp:  [98 F (36.7 C)-98.3 F (36.8 C)] 98 F (36.7 C) (02/13 0511) Pulse Rate:  [53-75] 67 (02/13 0511) Resp:  [18-21] 20 (02/13 0511) BP: (122-141)/(43-87) 137/77 (02/13 0511) SpO2:  [94 %-98 %] 98 % (02/13 0511)  Intake/Output from previous day: 02/12 0701 - 02/13 0700 In: 2006 [P.O.:1500; I.V.:456; IV Piggyback:50] Out: 2450 [Urine:2450] Intake/Output this shift: No intake/output data recorded.   Recent Labs  03/29/16 2022 03/30/16 0338 03/31/16 0048 04/01/16 0515  HGB 10.7* 10.0* 10.5* 10.6*    Recent Labs  03/31/16 0048 04/01/16 0515  WBC 11.2* 13.8*  RBC 3.29* 3.46*  HCT 30.1* 31.1*  PLT 206 309    Recent Labs  03/31/16 0048 04/01/16 0515  NA 136 134*  K 4.4 4.4  CL 107 101  CO2 22 25  BUN 28* 30*  CREATININE 0.97 0.98  GLUCOSE 278* 231*  CALCIUM 8.3* 8.7*   No results for input(s): LABPT, INR in the last 72 hours.  Neurologically intact Dorsiflexion/Plantar flexion intact No cellulitis present  Assessment/Plan: 2 Days Post-Op Procedure(s) (LRB): ARTHROSCOPY KNEE (Left) Up with therapy  Mylan Schwarz A 04/01/2016, 7:23 AM

## 2016-04-01 NOTE — Evaluation (Signed)
Physical Therapy Evaluation Patient Details Name: Zachary Mcdonald MRN: [id number] DOB: [birth year] Today's Date: 04/01/2016   History of Present Illness  76 y.o. male with past medical history of arthritis, gout, GERD, hypertension, chronic lower back pain, he presents to ED secondary to left knee pain, chills, patient reports is been having worsening lower back pain over last few weeks recently seen by Dr. Dellis Filbert, given steroid taper. Presented with left knee pain where he fell due to his knee giving out and was unable to get up from the floor and now s/p L knee arthroscopic irrigation debridement 03/25/16 and again 03/28/16. Returned to [facility] 2/10 from MD office.  Clinical Impression  The  Patient is very pleased with being able to ambulate today.  Pt admitted with above diagnosis. Pt currently with functional limitations due to the deficits listed below (see PT Problem List).  Pt will benefit from skilled PT to increase their independence and safety with mobility to allow discharge to the venue listed below.       Follow Up Recommendations Home health PT;Supervision for mobility/OOB    Equipment Recommendations  None recommended by PT    Recommendations for Other Services       Precautions / Restrictions Precautions Precautions: Fall Precaution Comments: Left leg DVT, on Heparin Restrictions Weight Bearing Restrictions: No Other Position/Activity Restrictions: WBAT      Mobility  Bed Mobility Overal bed mobility: Needs Assistance Bed Mobility: Supine to Sit     Supine to sit: HOB elevated;Min assist     General bed mobility comments: support to  assist L LE over EOB, extra effort to scoot to bed edge.  Transfers Overall transfer level: Needs assistance Equipment used: Rolling walker (2 wheeled) Transfers: Sit to/from Stand Sit to Stand: Min assist         General transfer comment: verbal cues for UE and LE positioning and safe technique, increased time and  effort  Ambulation/Gait Ambulation/Gait assistance: Min guard Ambulation Distance (Feet): 120 Feet Assistive device: Rolling walker (2 wheeled) Gait Pattern/deviations: Step-to pattern;Step-through pattern     General Gait Details: pt had R AFO with him today so donned shoes prior to ambulation, verbal cues for sequence, RW positioning, step length,   Stairs            Wheelchair Mobility    Modified Rankin (Stroke Patients Only)       Balance                                             Pertinent Vitals/Pain Pain Assessment: No/denies pain Pain Location: L knee     Home Living Family/patient expects to be discharged to:: Private residence Living Arrangements: Spouse/significant other   Type of Home: House Home Access: Ramped entrance     Home Layout: One level Home Equipment: None;Shower seat - built in      Prior Function Level of Independence: Needs assistance   Gait / Transfers Assistance Needed: ambulated with RW           Hand Dominance        Extremity/Trunk Assessment   Upper Extremity Assessment Upper Extremity Assessment: Overall WFL for tasks assessed    Lower Extremity Assessment Lower Extremity Assessment: RLE deficits/detail RLE Deficits / Details: able  to extend knee in sitting  to about - 30 extension LLE Deficits / Details: unable to perform  SLR, functionally observed at least 70* active knee flexion upon sitting EOB    Cervical / Trunk Assessment Cervical / Trunk Assessment: Normal  Communication   Communication: No difficulties  Cognition Arousal/Alertness: Awake/alert Behavior During Therapy: Impulsive;WFL for tasks assessed/performed Overall Cognitive Status: Within Functional Limits for tasks assessed                      General Comments      Exercises     Assessment/Plan    PT Assessment    PT Problem List            PT Treatment Interventions      PT Goals (Current goals  can be found in the Care Plan section)  Acute Rehab PT Goals Patient Stated Goal: to walk PT Goal Formulation: With patient Time For Goal Achievement: 04/08/16 Potential to Achieve Goals: Good    Frequency Min 5X/week   Barriers to discharge        Co-evaluation               End of Session Equipment Utilized During Treatment: Gait belt Activity Tolerance: Patient tolerated treatment well Patient left: in chair;with call bell/phone within reach Nurse Communication: Mobility status         Time: [id number] PT Time Calculation (min) (ACUTE ONLY): 28 min   Charges:   PT Evaluation $PT Eval Low Complexity: 1 Procedure PT Treatments $Gait Training: 8-22 mins   PT G Codes:        Claretha Cooper 04/01/2016, 1:00 PM Tresa Endo PT [phone]

## 2016-04-01 NOTE — Consult Note (Signed)
   [facility] CM Inpatient Consult   04/01/2016  Zachary Mcdonald 04/08/1940 [id number]    Patient screened for [place] Management services. Went to bedside to offer and explain [facility] Care Management program with [name] and wife. [name] pleasantly declined [place] Management follow up. He states his Primary Care office calls post hospital discharges. Accepted [facility] Care Management brochure with contact information to call in future if changes mind. Will make inpatient RNCM aware that patient declined [place] Management program services.  Marthenia Rolling, MSN-Ed, RN,BSN [place] Liaison [phone]

## 2016-04-01 NOTE — Progress Notes (Addendum)
[facility] for Heparin to apixaban Indication: DVT  Allergies  Allergen Reactions  . Oxycontin [Oxycodone Hcl] Nausea And Vomiting and Other (See Comments)    Dizziness   . Valsartan-Hydrochlorothiazide Diarrhea    Patient Measurements: Height: 6' (182.9 cm) Weight: 217 lb 13 oz (98.8 kg) IBW/kg (Calculated) : 77.6 Heparin Dosing Weight: 97.5  Vital Signs: Temp: 98 F (36.7 C) (02/13 0511) Temp Source: Oral (02/13 0511) BP: 137/77 (02/13 0511) Pulse Rate: 67 (02/13 0511)  Labs:  Recent Labs  03/30/16 0338  03/31/16 0048 03/31/16 1147 03/31/16 2004 04/01/16 0515  HGB 10.0*  --  10.5*  --   --  10.6*  HCT 29.6*  --  30.1*  --   --  31.1*  PLT 161  --  206  --   --  309  HEPARINUNFRC  --   < > 0.24* 0.53 0.51 0.68  CREATININE 1.17  --  0.97  --   --  0.98  < > = values in this interval not displayed.  Estimated Creatinine Clearance: 79.3 mL/min (by C-G formula based on SCr of 0.98 mg/dL).   Assessment: 60 yoM readmitted on 2/10 with acute gout flare superimposed on recent septic arthritis of knee. Now positive for acute DVT in LLE - pharmacy is consulted to dose Heparin IV.  On 2/13, orders to change to apixaban per pharmacy dosing.    No PTA anticoagulants.  This admission, he last received Heparin SQ for VTE prophylaxis on 2/11 at 12:00  Today, 04/01/2016  Heparin level this morning at upper end of therapeutic range (= 0.68 units/mL) on heparin infusion at 1900 units/hr   Hgb low but stable, Pltc WNL  No bleeding or complications with infusion reported by nursing  Goal of Therapy:  Heparin level 0.3-0.7 units/ml Monitor platelets by anticoagulation protocol: Yes   Plan:   Heparin level trended up to upper end goal, make small decreased in heparin rate, reduce to 1800 units/hr.  Daily heparin level and CBC while on heparin infusion.  Continue to monitor for s/sx bleeding, thrombosis.  Noted plans to transition to  [place], PharmD, BCPS.   Pager: [phone] 04/01/2016 7:12 AM  Addendum:  Heparin to apixaban  Plan:  Apixaban 10mg  PO BID x 7 days then 5mg  PO BID  Pharmacist to provide medication education and discount coupon card prior to discharge  Monitor for bleeding  No interactions with current medications (no interaction with levofloxacin)  Doreene Eland, PharmD, BCPS.   Pager: [phone] 04/01/2016 11:11 AM

## 2016-04-01 NOTE — Progress Notes (Signed)
Pt have insurance for his medications,Tricare and Medicare. CM unable to assist with medications. Medications need to be sent to Pharmacy.

## 2016-04-01 NOTE — Progress Notes (Signed)
Assumed care of patient. Received report from [employer]. Agree with previous assessment. Patient currently asleep. Will continue to monitor.

## 2016-04-01 NOTE — Progress Notes (Deleted)
Physical Therapy Treatment Patient Details Name: LEANARD ISHEE MRN: [id number] DOB: 11-12-1940 Today's Date: 04/01/2016    History of Present Illness 76 y.o. male with past medical history of arthritis, gout, GERD, hypertension, chronic lower back pain, he presents to ED secondary to left knee pain, chills, patient reports is been having worsening lower back pain over last few weeks recently seen by Dr. Dellis Filbert, given steroid taper. Presented with left knee pain where he fell due to his knee giving out and was unable to get up from the floor and now s/p L knee arthroscopic irrigation debridement 03/25/16 and again 03/28/16    PT Comments    The Patient is very motivated to progress ambulation today. The left knee was stable for weight bearing. Continue PT.  Follow Up Recommendations  Home health PT;Supervision for mobility/OOB     Equipment Recommendations  Wheelchair cushion (measurements PT);3in1 (PT)    Recommendations for Other Services       Precautions / Restrictions Precautions Precautions: Fall Precaution Comments: Left leg DVT, on Heparin Restrictions Weight Bearing Restrictions: No Other Position/Activity Restrictions: WBAT    Mobility  Bed Mobility Overal bed mobility: Needs Assistance Bed Mobility: Supine to Sit     Supine to sit: HOB elevated;Min assist     General bed mobility comments: support to  assist L LE over EOB, extra effort to scoot to bed edge.  Transfers Overall transfer level: Needs assistance Equipment used: Rolling walker (2 wheeled) Transfers: Sit to/from Stand Sit to Stand: Min assist         General transfer comment: verbal cues for UE and LE positioning and safe technique, increased time and effort  Ambulation/Gait Ambulation/Gait assistance: Min guard Ambulation Distance (Feet): 120 Feet Assistive device: Rolling walker (2 wheeled) Gait Pattern/deviations: Step-to pattern;Step-through pattern     General Gait Details: pt had  R AFO with him today so donned shoes prior to ambulation, verbal cues for sequence, RW positioning, step length,    Stairs            Wheelchair Mobility    Modified Rankin (Stroke Patients Only)       Balance                                    Cognition Arousal/Alertness: Awake/alert Behavior During Therapy: Impulsive;WFL for tasks assessed/performed Overall Cognitive Status: Within Functional Limits for tasks assessed                      Exercises      General Comments        Pertinent Vitals/Pain Pain Assessment: No/denies pain Pain Location: L knee     Home Living                      Prior Function            PT Goals (current goals can now be found in the care plan section) Progress towards PT goals: Progressing toward goals    Frequency    Min 5X/week      PT Plan Current plan remains appropriate    Co-evaluation             End of Session Equipment Utilized During Treatment: Gait belt Activity Tolerance: Patient tolerated treatment well Patient left: in chair;with call bell/phone within reach     Time: [id number] PT Time Calculation (min) (ACUTE ONLY):  28 min  Charges:  $Gait Training: 23-37 mins                    G Codes:      Claretha Cooper 04/01/2016, 11:36 AM Tresa Endo PT [phone]

## 2016-04-01 NOTE — Progress Notes (Signed)
Patient states that he has an appointment with [facility] today.  RN tried to cancel the appointment but recording stated to call back during business hours.

## 2016-04-02 LAB — BODY FLUID CULTURE
CULTURE: NO GROWTH
SPECIAL REQUESTS: NORMAL

## 2016-04-02 LAB — HEMOGLOBIN A1C
Hgb A1c MFr Bld: 6.8 % — ABNORMAL HIGH (ref 4.8–5.6)
Mean Plasma Glucose: 148 mg/dL

## 2016-04-03 DIAGNOSIS — M109 Gout, unspecified: Secondary | ICD-10-CM | POA: Diagnosis not present

## 2016-04-03 DIAGNOSIS — M545 Low back pain: Secondary | ICD-10-CM | POA: Diagnosis not present

## 2016-04-03 DIAGNOSIS — N139 Obstructive and reflux uropathy, unspecified: Secondary | ICD-10-CM | POA: Diagnosis not present

## 2016-04-03 DIAGNOSIS — M1991 Primary osteoarthritis, unspecified site: Secondary | ICD-10-CM | POA: Diagnosis not present

## 2016-04-03 DIAGNOSIS — M00862 Arthritis due to other bacteria, left knee: Secondary | ICD-10-CM | POA: Diagnosis not present

## 2016-04-03 DIAGNOSIS — B954 Other streptococcus as the cause of diseases classified elsewhere: Secondary | ICD-10-CM | POA: Diagnosis not present

## 2016-04-04 LAB — CULTURE, BLOOD (ROUTINE X 2)
Culture: NO GROWTH
Culture: NO GROWTH

## 2016-04-05 DIAGNOSIS — M545 Low back pain: Secondary | ICD-10-CM | POA: Diagnosis not present

## 2016-04-05 DIAGNOSIS — M00862 Arthritis due to other bacteria, left knee: Secondary | ICD-10-CM | POA: Diagnosis not present

## 2016-04-05 DIAGNOSIS — B954 Other streptococcus as the cause of diseases classified elsewhere: Secondary | ICD-10-CM | POA: Diagnosis not present

## 2016-04-05 DIAGNOSIS — M109 Gout, unspecified: Secondary | ICD-10-CM | POA: Diagnosis not present

## 2016-04-05 DIAGNOSIS — M1991 Primary osteoarthritis, unspecified site: Secondary | ICD-10-CM | POA: Diagnosis not present

## 2016-04-05 DIAGNOSIS — N139 Obstructive and reflux uropathy, unspecified: Secondary | ICD-10-CM | POA: Diagnosis not present

## 2016-04-07 DIAGNOSIS — L819 Disorder of pigmentation, unspecified: Secondary | ICD-10-CM | POA: Diagnosis not present

## 2016-04-07 DIAGNOSIS — I10 Essential (primary) hypertension: Secondary | ICD-10-CM | POA: Diagnosis not present

## 2016-04-07 DIAGNOSIS — R001 Bradycardia, unspecified: Secondary | ICD-10-CM | POA: Diagnosis not present

## 2016-04-07 DIAGNOSIS — E119 Type 2 diabetes mellitus without complications: Secondary | ICD-10-CM | POA: Diagnosis not present

## 2016-04-07 DIAGNOSIS — M109 Gout, unspecified: Secondary | ICD-10-CM | POA: Diagnosis not present

## 2016-04-07 DIAGNOSIS — R339 Retention of urine, unspecified: Secondary | ICD-10-CM | POA: Diagnosis not present

## 2016-04-07 DIAGNOSIS — R238 Other skin changes: Secondary | ICD-10-CM | POA: Diagnosis not present

## 2016-04-07 DIAGNOSIS — I82402 Acute embolism and thrombosis of unspecified deep veins of left lower extremity: Secondary | ICD-10-CM | POA: Diagnosis not present

## 2016-04-07 DIAGNOSIS — E784 Other hyperlipidemia: Secondary | ICD-10-CM | POA: Diagnosis not present

## 2016-04-07 DIAGNOSIS — M00869 Arthritis due to other bacteria, unspecified knee: Secondary | ICD-10-CM | POA: Diagnosis not present

## 2016-04-08 DIAGNOSIS — M00862 Arthritis due to other bacteria, left knee: Secondary | ICD-10-CM | POA: Diagnosis not present

## 2016-04-08 DIAGNOSIS — M545 Low back pain: Secondary | ICD-10-CM | POA: Diagnosis not present

## 2016-04-08 DIAGNOSIS — N139 Obstructive and reflux uropathy, unspecified: Secondary | ICD-10-CM | POA: Diagnosis not present

## 2016-04-08 DIAGNOSIS — M109 Gout, unspecified: Secondary | ICD-10-CM | POA: Diagnosis not present

## 2016-04-08 DIAGNOSIS — B954 Other streptococcus as the cause of diseases classified elsewhere: Secondary | ICD-10-CM | POA: Diagnosis not present

## 2016-04-08 DIAGNOSIS — M1991 Primary osteoarthritis, unspecified site: Secondary | ICD-10-CM | POA: Diagnosis not present

## 2016-04-10 DIAGNOSIS — M545 Low back pain: Secondary | ICD-10-CM | POA: Diagnosis not present

## 2016-04-10 DIAGNOSIS — B954 Other streptococcus as the cause of diseases classified elsewhere: Secondary | ICD-10-CM | POA: Diagnosis not present

## 2016-04-10 DIAGNOSIS — N139 Obstructive and reflux uropathy, unspecified: Secondary | ICD-10-CM | POA: Diagnosis not present

## 2016-04-10 DIAGNOSIS — M1991 Primary osteoarthritis, unspecified site: Secondary | ICD-10-CM | POA: Diagnosis not present

## 2016-04-10 DIAGNOSIS — M00862 Arthritis due to other bacteria, left knee: Secondary | ICD-10-CM | POA: Diagnosis not present

## 2016-04-10 DIAGNOSIS — M109 Gout, unspecified: Secondary | ICD-10-CM | POA: Diagnosis not present

## 2016-04-11 DIAGNOSIS — M545 Low back pain: Secondary | ICD-10-CM | POA: Diagnosis not present

## 2016-04-11 DIAGNOSIS — B954 Other streptococcus as the cause of diseases classified elsewhere: Secondary | ICD-10-CM | POA: Diagnosis not present

## 2016-04-11 DIAGNOSIS — N139 Obstructive and reflux uropathy, unspecified: Secondary | ICD-10-CM | POA: Diagnosis not present

## 2016-04-11 DIAGNOSIS — M00862 Arthritis due to other bacteria, left knee: Secondary | ICD-10-CM | POA: Diagnosis not present

## 2016-04-11 DIAGNOSIS — M109 Gout, unspecified: Secondary | ICD-10-CM | POA: Diagnosis not present

## 2016-04-11 DIAGNOSIS — M1991 Primary osteoarthritis, unspecified site: Secondary | ICD-10-CM | POA: Diagnosis not present

## 2016-04-14 DIAGNOSIS — L03116 Cellulitis of left lower limb: Secondary | ICD-10-CM | POA: Diagnosis not present

## 2016-04-14 DIAGNOSIS — Z4789 Encounter for other orthopedic aftercare: Secondary | ICD-10-CM | POA: Diagnosis not present

## 2016-04-14 DIAGNOSIS — M545 Low back pain: Secondary | ICD-10-CM | POA: Diagnosis not present

## 2016-04-16 ENCOUNTER — Encounter: Payer: Self-pay | Admitting: Internal Medicine

## 2016-04-16 ENCOUNTER — Ambulatory Visit (INDEPENDENT_AMBULATORY_CARE_PROVIDER_SITE_OTHER): Payer: Medicare Other | Admitting: Internal Medicine

## 2016-04-16 DIAGNOSIS — I82402 Acute embolism and thrombosis of unspecified deep veins of left lower extremity: Secondary | ICD-10-CM | POA: Diagnosis not present

## 2016-04-16 DIAGNOSIS — M00262 Other streptococcal arthritis, left knee: Secondary | ICD-10-CM

## 2016-04-16 DIAGNOSIS — M10062 Idiopathic gout, left knee: Secondary | ICD-10-CM

## 2016-04-16 MED ORDER — PREDNISONE 10 MG PO TABS: ORAL_TABLET | ORAL | 0 refills | Status: DC

## 2016-04-16 MED ORDER — LEVOFLOXACIN 500 MG PO TABS: 500.0000 mg | ORAL_TABLET | Freq: Every day | ORAL | 0 refills | Status: DC

## 2016-04-16 NOTE — Assessment & Plan Note (Signed)
Acute DVT was noted that time of his recent hospitalization for gout. He was started on Eliquis which he continues to take.

## 2016-04-16 NOTE — Assessment & Plan Note (Signed)
I suspect that his current problem with pain and swelling of his left knee and persistent fever is due to a persistent flare of gout rather than infection. His recent prednisone taper was very short. I discussed the situation with his primary care provider, Dr. Berneta Sages today. I will start him on prednisone 40 mg daily and Dr. Dagmar Hait will arrange for him to be seen in his office within the next week.

## 2016-04-16 NOTE — Assessment & Plan Note (Signed)
I suspect that his streptococcal septic arthritis and bacteremia have been treated adequately but since he is persistently symptomatic and repeat cultures are not final I will extend his levofloxacin therapy for 2 more weeks.

## 2016-04-16 NOTE — Progress Notes (Signed)
[facility]  Patient Active Problem List   Diagnosis Date Noted  . Acute deep vein thrombosis (DVT) of left lower extremity ([place]) 03/31/2016    Priority: High  . Acute gout 03/30/2016    Priority: High  . Streptococcal bacteremia 03/26/2016    Priority: High  . Septic arthritis of knee, left ([place]) 03/24/2016    Priority: High  . 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 Normocytic anemia 03/26/2016  . Thrombocytopenia ([place]) 03/26/2016  . Multilevel degenerative disc disease 03/26/2016  . GERD (gastroesophageal reflux disease) 03/26/2016  . Dyslipidemia 03/26/2016  . Hypertension 03/24/2016  . Hypothyroidism 03/24/2016    Patient's Medications  New Prescriptions   No medications on file  Previous Medications   ACETAMINOPHEN (TYLENOL) 500 MG TABLET    Take 1,000-1,500 mg by mouth every 6 (six) hours as needed for mild pain, moderate pain or fever.    ALLOPURINOL (ZYLOPRIM) 300 MG TABLET    Take 300 mg by mouth every morning.    APIXABAN (ELIQUIS) 5 MG TABS TABLET    Take two tabs in the morning and two tabs at night through 2/19, then take 1 tab morning and night thereafter for three months.   BENICAR HCT 40-25 MG TABLET    Take 1 tablet by mouth every morning.    BISACODYL (DULCOLAX) 10 MG SUPPOSITORY    Place 1 suppository (10 mg total) rectally daily as needed for moderate constipation.   BISACODYL (DULCOLAX) 5 MG EC TABLET    Take 1 tablet (5 mg total) by mouth daily as needed for moderate constipation.   BISMUTH SUBSALICYLATE (PEPTO BISMOL) 262 MG CHEWABLE TABLET    Chew 1 tablet (262 mg total) by mouth 4 (four) times daily -  before meals and at bedtime.   FERROUS SULFATE 325 (65 FE) MG TABLET    Take 1 tablet (325 mg total) by mouth 3 (three) times daily after meals.   FISH OIL-OMEGA-3 FATTY ACIDS 1000 MG CAPSULE    Take 2 g by mouth every morning.    GABAPENTIN (NEURONTIN) 300 MG CAPSULE    Take 300 mg by mouth 3 (three) times daily.     LEVOTHYROXINE (SYNTHROID, LEVOTHROID) 50 MCG TABLET    Take 50 mcg by mouth daily before breakfast.   OXYCODONE (OXY IR/ROXICODONE) 5 MG IMMEDIATE RELEASE TABLET    Take 1-2 tablets (5-10 mg total) by mouth every 4 (four) hours as needed for severe pain.   POLYETHYLENE GLYCOL (MIRALAX / GLYCOLAX) PACKET    Take 17 g by mouth 2 (two) times daily.   SENNA (SENOKOT) 8.6 MG TABS TABLET    Take 1 tablet (8.6 mg total) by mouth 2 (two) times daily.   SIMVASTATIN (ZOCOR) 40 MG TABLET    Take 40 mg by mouth every morning.   TAMSULOSIN (FLOMAX) 0.4 MG CAPS CAPSULE    Take 0.4 mg by mouth every morning.   Modified Medications   Modified Medication Previous Medication   LEVOFLOXACIN (LEVAQUIN) 500 MG TABLET levofloxacin (LEVAQUIN) 500 MG tablet      Take 1 tablet (500 mg total) by mouth daily.    Take 1 tablet (500 mg total) by mouth daily.   PREDNISONE (DELTASONE) 10 MG TABLET predniSONE (DELTASONE) 10 MG tablet      Take 4 tabs daily until you are seen back in Dr. Danna Hefty office within the next week.    Take 3 tabs daily for two days,  then 2 tabs daily for 2 days, then 1 tab daily for 2 days, then stop  Discontinued Medications   No medications on file    Subjective: Zachary Mcdonald is in for his hospital follow-up visit. He was hospitalized about 4 weeks ago with fever and acute left knee pain. He was found to have septic arthritis and bacteremia with group G Streptococcus. He underwent operative washout of his knee and was converted to oral levofloxacin upon discharge. He was readmitted 2 days later with recurrent fever and swelling. He underwent a second surgery. Synovial fluid Gram stain and cultures were negative at that time but showed intracellular gout crystals. He was started on prednisone and discharged with a very brief taper over 3 days. He and his wife do not recall noting significant improvement. He is continued to be bothered by left knee swelling, pain and fever since that time. He saw his  orthopedic surgeon, Dr. Lindwood Qua 2 days ago and underwent arthrocentesis. I do not have all of those results but did call the laboratory and learned that no organisms were seen on Gram stain and culture is negative so far. Intracellular gout crystals were seen again.  Review of Systems: Review of Systems  Constitutional: Positive for chills, fever and malaise/fatigue. Negative for diaphoresis and weight loss.  HENT: Negative for sore throat.   Eyes: Positive for blurred vision and double vision.       He has noted some visual changes since his recent hospitalizations.  Respiratory: Positive for cough. Negative for sputum production and shortness of breath.   Cardiovascular: Negative for chest pain.  Gastrointestinal: Negative for abdominal pain, diarrhea, heartburn, nausea and vomiting.  Genitourinary: Positive for urgency. Negative for dysuria and frequency.       He has had some urinary incontinence since his Foley catheter was removed.  Musculoskeletal: Positive for joint pain. Negative for myalgias.  Skin: Negative for rash.  Neurological: Negative for dizziness and headaches.    Past Medical History:  Diagnosis Date  . Arthritis    spine and hands  . Borderline diabetes   . Colon polyps    Tubular Adenoma   . GERD (gastroesophageal reflux disease)   . Gout   . Helicobacter pylori gastritis   . Hyperlipidemia   . Hypertension   . Hyperthyroidism     Social History  Substance Use Topics  . Smoking status: Never Smoker  . Smokeless tobacco: Never Used  . Alcohol use No    Family History  Problem Relation Age of Onset  . Diabetes Mother   . Colon cancer Brother     16's  . Diabetes Brother     Allergies  Allergen Reactions  . Oxycontin [Oxycodone Hcl] Nausea And Vomiting and Other (See Comments)    Dizziness   . Valsartan-Hydrochlorothiazide Diarrhea    Objective: Vitals:   04/16/16 1002  BP: 128/68  Pulse: 87  Temp: 98.4 F (36.9 C)  TempSrc: Oral    Weight: 210 lb 8 oz (95.5 kg)  Height: 6' (1.829 m)   Body mass index is 28.55 kg/m.  Physical Exam  Constitutional: He is oriented to person, place, and time.  He appears uncomfortable due to knee pain. He is accompanied by his wife.  HENT:  Mouth/Throat: No oropharyngeal exudate.  Eyes: Conjunctivae are normal.  Cardiovascular: Normal rate and regular rhythm.   No murmur heard. Pulmonary/Chest: Effort normal and breath sounds normal. He has no wheezes. He has no rales.  Abdominal: Soft. There  is no tenderness.  Musculoskeletal: He exhibits edema and tenderness.  He has healing incisions over his left knee and a Band-Aid over the lateral site where the arthrocentesis was done 2 days ago. He has diffuse swelling with slight warmth and tenderness to palpation. He has pitting edema to the level of the knee.  Neurological: He is alert and oriented to person, place, and time.  Skin: No rash noted.  Psychiatric: Mood and affect normal.    Lab Results    Problem List Items Addressed This Visit      High   Acute deep vein thrombosis (DVT) of left lower extremity (HCC)    Acute DVT was noted that time of his recent hospitalization for gout. He was started on Eliquis which he continues to take.      Acute gout    I suspect that his current problem with pain and swelling of his left knee and persistent fever is due to a persistent flare of gout rather than infection. His recent prednisone taper was very short. I discussed the situation with his primary care provider, Dr. Berneta Sages today. I will start him on prednisone 40 mg daily and Dr. Dagmar Hait will arrange for him to be seen in his office within the next week.      Septic arthritis of knee, left (HCC)    I suspect that his streptococcal septic arthritis and bacteremia have been treated adequately but since he is persistently symptomatic and repeat cultures are not final I will extend his levofloxacin therapy for 2 more weeks.       Relevant Medications   predniSONE (DELTASONE) 10 MG tablet       Zachary Bickers, MD [facility] [place] [facility] [phone] pager   [phone] cell 04/16/2016, 11:58 AM

## 2016-04-18 DIAGNOSIS — L03116 Cellulitis of left lower limb: Secondary | ICD-10-CM | POA: Diagnosis not present

## 2016-04-18 DIAGNOSIS — M10462 Other secondary gout, left knee: Secondary | ICD-10-CM | POA: Diagnosis not present

## 2016-04-18 DIAGNOSIS — Z4789 Encounter for other orthopedic aftercare: Secondary | ICD-10-CM | POA: Diagnosis not present

## 2016-04-22 DIAGNOSIS — M109 Gout, unspecified: Secondary | ICD-10-CM | POA: Diagnosis not present

## 2016-04-22 DIAGNOSIS — N139 Obstructive and reflux uropathy, unspecified: Secondary | ICD-10-CM | POA: Diagnosis not present

## 2016-04-22 DIAGNOSIS — B954 Other streptococcus as the cause of diseases classified elsewhere: Secondary | ICD-10-CM | POA: Diagnosis not present

## 2016-04-22 DIAGNOSIS — M545 Low back pain: Secondary | ICD-10-CM | POA: Diagnosis not present

## 2016-04-22 DIAGNOSIS — M1991 Primary osteoarthritis, unspecified site: Secondary | ICD-10-CM | POA: Diagnosis not present

## 2016-04-22 DIAGNOSIS — M00862 Arthritis due to other bacteria, left knee: Secondary | ICD-10-CM | POA: Diagnosis not present

## 2016-04-24 DIAGNOSIS — M109 Gout, unspecified: Secondary | ICD-10-CM | POA: Diagnosis not present

## 2016-04-24 DIAGNOSIS — I82402 Acute embolism and thrombosis of unspecified deep veins of left lower extremity: Secondary | ICD-10-CM | POA: Diagnosis not present

## 2016-04-24 DIAGNOSIS — Z6827 Body mass index (BMI) 27.0-27.9, adult: Secondary | ICD-10-CM | POA: Diagnosis not present

## 2016-04-24 DIAGNOSIS — M10062 Idiopathic gout, left knee: Secondary | ICD-10-CM | POA: Diagnosis not present

## 2016-04-24 DIAGNOSIS — M00862 Arthritis due to other bacteria, left knee: Secondary | ICD-10-CM | POA: Diagnosis not present

## 2016-04-24 DIAGNOSIS — R238 Other skin changes: Secondary | ICD-10-CM | POA: Diagnosis not present

## 2016-04-24 DIAGNOSIS — M00869 Arthritis due to other bacteria, unspecified knee: Secondary | ICD-10-CM | POA: Diagnosis not present

## 2016-04-24 DIAGNOSIS — E119 Type 2 diabetes mellitus without complications: Secondary | ICD-10-CM | POA: Diagnosis not present

## 2016-04-24 DIAGNOSIS — M545 Low back pain: Secondary | ICD-10-CM | POA: Diagnosis not present

## 2016-04-24 DIAGNOSIS — B954 Other streptococcus as the cause of diseases classified elsewhere: Secondary | ICD-10-CM | POA: Diagnosis not present

## 2016-04-24 DIAGNOSIS — M1991 Primary osteoarthritis, unspecified site: Secondary | ICD-10-CM | POA: Diagnosis not present

## 2016-04-24 DIAGNOSIS — N139 Obstructive and reflux uropathy, unspecified: Secondary | ICD-10-CM | POA: Diagnosis not present

## 2016-05-05 DIAGNOSIS — L03116 Cellulitis of left lower limb: Secondary | ICD-10-CM | POA: Diagnosis not present

## 2016-05-05 DIAGNOSIS — M109 Gout, unspecified: Secondary | ICD-10-CM | POA: Diagnosis not present

## 2016-05-05 DIAGNOSIS — Z4789 Encounter for other orthopedic aftercare: Secondary | ICD-10-CM | POA: Diagnosis not present

## 2016-05-28 ENCOUNTER — Encounter: Payer: Self-pay | Admitting: Internal Medicine

## 2016-05-28 ENCOUNTER — Ambulatory Visit (INDEPENDENT_AMBULATORY_CARE_PROVIDER_SITE_OTHER): Payer: Medicare Other | Admitting: Internal Medicine

## 2016-05-28 DIAGNOSIS — M00262 Other streptococcal arthritis, left knee: Secondary | ICD-10-CM | POA: Diagnosis present

## 2016-05-28 NOTE — Progress Notes (Signed)
[facility]  Patient Active Problem List   Diagnosis Date Noted  . Acute deep vein thrombosis (DVT) of left lower extremity ([facility]) 03/31/2016    Priority: High  . Acute gout 03/30/2016    Priority: High  . Streptococcal bacteremia 03/26/2016    Priority: High  . Septic arthritis of knee, left ([place]) 03/24/2016    Priority: High  . 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 Normocytic anemia 03/26/2016  . Thrombocytopenia ([place]) 03/26/2016  . Multilevel degenerative disc disease 03/26/2016  . GERD (gastroesophageal reflux disease) 03/26/2016  . Dyslipidemia 03/26/2016  . Hypertension 03/24/2016  . Hypothyroidism 03/24/2016    Patient's Medications  New Prescriptions   No medications on file  Previous Medications   ACETAMINOPHEN (TYLENOL) 500 MG TABLET    Take 1,000-1,500 mg by mouth every 6 (six) hours as needed for mild pain, moderate pain or fever.    ALLOPURINOL (ZYLOPRIM) 300 MG TABLET    Take 300 mg by mouth every morning.    APIXABAN (ELIQUIS) 5 MG TABS TABLET    Take two tabs in the morning and two tabs at night through 2/19, then take 1 tab morning and night thereafter for three months.   BENICAR HCT 40-25 MG TABLET    Take 1 tablet by mouth every morning.    BISACODYL (DULCOLAX) 10 MG SUPPOSITORY    Place 1 suppository (10 mg total) rectally daily as needed for moderate constipation.   BISACODYL (DULCOLAX) 5 MG EC TABLET    Take 1 tablet (5 mg total) by mouth daily as needed for moderate constipation.   BISMUTH SUBSALICYLATE (PEPTO BISMOL) 262 MG CHEWABLE TABLET    Chew 1 tablet (262 mg total) by mouth 4 (four) times daily -  before meals and at bedtime.   FERROUS SULFATE 325 (65 FE) MG TABLET    Take 1 tablet (325 mg total) by mouth 3 (three) times daily after meals.   FISH OIL-OMEGA-3 FATTY ACIDS 1000 MG CAPSULE    Take 2 g by mouth every morning.    GABAPENTIN (NEURONTIN) 300 MG CAPSULE    Take 300 mg by mouth 3 (three) times daily.     LEVOFLOXACIN (LEVAQUIN) 500 MG TABLET    Take 1 tablet (500 mg total) by mouth daily.   LEVOTHYROXINE (SYNTHROID, LEVOTHROID) 50 MCG TABLET    Take 50 mcg by mouth daily before breakfast.   OMEPRAZOLE (PRILOSEC) 20 MG CAPSULE       OXYCODONE (OXY IR/ROXICODONE) 5 MG IMMEDIATE RELEASE TABLET    Take 1-2 tablets (5-10 mg total) by mouth every 4 (four) hours as needed for severe pain.   POLYETHYLENE GLYCOL (MIRALAX / GLYCOLAX) PACKET    Take 17 g by mouth 2 (two) times daily.   SENNA (SENOKOT) 8.6 MG TABS TABLET    Take 1 tablet (8.6 mg total) by mouth 2 (two) times daily.   SIMVASTATIN (ZOCOR) 40 MG TABLET    Take 40 mg by mouth every morning.   TAMSULOSIN (FLOMAX) 0.4 MG CAPS CAPSULE    Take 0.4 mg by mouth every morning.   Modified Medications   No medications on file  Discontinued Medications   PREDNISONE (DELTASONE) 10 MG TABLET    Take 4 tabs daily until you are seen back in Dr. Danna Hefty office within the next week.    Subjective: Zachary Mcdonald is in with his wife for his routine follow-up visit. He completed 6 weeks of oral levofloxacin  one month ago for his group G streptococcal bacteremia and septic left knee. He had no problems tolerating his levofloxacin. He is no longer having any knee pain and he is not requiring any pain medication. He got a pedicure this morning and the woman doing it noted that his left knee was more swollen but he states this is completely unchanged.  Review of Systems: Review of Systems  Constitutional: Positive for malaise/fatigue. Negative for chills, diaphoresis and fever.  Gastrointestinal: Negative for abdominal pain, diarrhea, nausea and vomiting.  Musculoskeletal: Positive for back pain. Negative for joint pain.    Past Medical History:  Diagnosis Date  . Arthritis    spine and hands  . Borderline diabetes   . Colon polyps    Tubular Adenoma   . GERD (gastroesophageal reflux disease)   . Gout   . Helicobacter pylori gastritis   . Hyperlipidemia    . Hypertension   . Hyperthyroidism     Social History  Substance Use Topics  . Smoking status: Never Smoker  . Smokeless tobacco: Never Used  . Alcohol use No    Family History  Problem Relation Age of Onset  . Diabetes Mother   . Colon cancer Brother     23's  . Diabetes Brother     Allergies  Allergen Reactions  . Oxycontin [Oxycodone Hcl] Nausea And Vomiting and Other (See Comments)    Dizziness   . Valsartan-Hydrochlorothiazide Diarrhea    Objective: Vitals:   05/28/16 1440  BP: 114/65  Pulse: 62  Temp: 97.6 F (36.4 C)  TempSrc: Oral  Weight: 197 lb (89.4 kg)  Height: 6' (1.829 m)   Body mass index is 26.72 kg/m.  Physical Exam  Constitutional: He is oriented to person, place, and time.  He is in good spirits.  Musculoskeletal: Normal range of motion. He exhibits no tenderness.  His left knee is more swollen than his right but this is unchanged from previous exams. Surgical incisions are fully healed. There is no unusual warmth or redness.  Neurological: He is alert and oriented to person, place, and time. Gait normal.  Skin: No rash noted.  Psychiatric: Mood and affect normal.    Lab Results    Problem List Items Addressed This Visit      High   Septic arthritis of knee, left (HCC)    It appears that his infection has been cured. He can follow-up here as needed.          Michel Bickers, MD [facility] [place] [facility] [phone] pager   [phone] cell 05/28/2016, 3:00 PM

## 2016-05-28 NOTE — Assessment & Plan Note (Signed)
It appears that his infection has been cured.  He can follow-up here as needed. 

## 2016-07-25 DIAGNOSIS — I82402 Acute embolism and thrombosis of unspecified deep veins of left lower extremity: Secondary | ICD-10-CM | POA: Diagnosis not present

## 2016-07-25 DIAGNOSIS — N183 Chronic kidney disease, stage 3 (moderate): Secondary | ICD-10-CM | POA: Diagnosis not present

## 2016-07-25 DIAGNOSIS — M10062 Idiopathic gout, left knee: Secondary | ICD-10-CM | POA: Diagnosis not present

## 2016-07-25 DIAGNOSIS — E119 Type 2 diabetes mellitus without complications: Secondary | ICD-10-CM | POA: Diagnosis not present

## 2016-07-25 DIAGNOSIS — Z1389 Encounter for screening for other disorder: Secondary | ICD-10-CM | POA: Diagnosis not present

## 2016-07-25 DIAGNOSIS — R339 Retention of urine, unspecified: Secondary | ICD-10-CM | POA: Diagnosis not present

## 2016-07-25 DIAGNOSIS — M00869 Arthritis due to other bacteria, unspecified knee: Secondary | ICD-10-CM | POA: Diagnosis not present

## 2016-07-25 DIAGNOSIS — Z6828 Body mass index (BMI) 28.0-28.9, adult: Secondary | ICD-10-CM | POA: Diagnosis not present

## 2016-07-25 DIAGNOSIS — I10 Essential (primary) hypertension: Secondary | ICD-10-CM | POA: Diagnosis not present

## 2016-10-10 DIAGNOSIS — D1801 Hemangioma of skin and subcutaneous tissue: Secondary | ICD-10-CM | POA: Diagnosis not present

## 2016-10-10 DIAGNOSIS — L820 Inflamed seborrheic keratosis: Secondary | ICD-10-CM | POA: Diagnosis not present

## 2016-10-10 DIAGNOSIS — Z85828 Personal history of other malignant neoplasm of skin: Secondary | ICD-10-CM | POA: Diagnosis not present

## 2016-10-10 DIAGNOSIS — L245 Irritant contact dermatitis due to other chemical products: Secondary | ICD-10-CM | POA: Diagnosis not present

## 2016-10-10 DIAGNOSIS — L570 Actinic keratosis: Secondary | ICD-10-CM | POA: Diagnosis not present

## 2016-10-10 DIAGNOSIS — L821 Other seborrheic keratosis: Secondary | ICD-10-CM | POA: Diagnosis not present

## 2016-10-10 DIAGNOSIS — L814 Other melanin hyperpigmentation: Secondary | ICD-10-CM | POA: Diagnosis not present

## 2016-10-16 ENCOUNTER — Ambulatory Visit (INDEPENDENT_AMBULATORY_CARE_PROVIDER_SITE_OTHER): Payer: Medicare Other | Admitting: Podiatry

## 2016-10-16 ENCOUNTER — Ambulatory Visit (INDEPENDENT_AMBULATORY_CARE_PROVIDER_SITE_OTHER): Payer: Medicare Other

## 2016-10-16 ENCOUNTER — Encounter: Payer: Self-pay | Admitting: Podiatry

## 2016-10-16 ENCOUNTER — Other Ambulatory Visit: Payer: Self-pay | Admitting: Podiatry

## 2016-10-16 VITALS — BP 126/68 | HR 64

## 2016-10-16 DIAGNOSIS — L600 Ingrowing nail: Secondary | ICD-10-CM

## 2016-10-16 DIAGNOSIS — M779 Enthesopathy, unspecified: Secondary | ICD-10-CM

## 2016-10-16 DIAGNOSIS — M79671 Pain in right foot: Secondary | ICD-10-CM | POA: Diagnosis not present

## 2016-10-16 NOTE — Progress Notes (Signed)
   Subjective:    Patient ID: Zachary Mcdonald, male    DOB: Aug 18, 1940, 76 y.o.   MRN: [id number]  HPI  Chief Complaint  Patient presents with  . Foot Pain       Review of Systems  Musculoskeletal: Positive for back pain.       Objective:   Physical Exam        Assessment & Plan:

## 2016-10-16 NOTE — Progress Notes (Signed)
Subjective:    Patient ID: Zachary Mcdonald, male   DOB: 76 y.o.   MRN: [id number]   HPI patient states that he's had pain in his right big toe and also thinks he may have an ingrown toenail as part of this condition    Review of Systems  All other systems reviewed and are negative.       Objective:  Physical Exam  Cardiovascular: Intact distal pulses.   Musculoskeletal: Normal range of motion.  Neurological: He is alert.  Skin: Skin is warm.  Nursing note and vitals reviewed.  neurovascular status found to be intact muscle strength was adequate range of motion within normal limits with patient found to have mild edema in the right hallux itself and an incurvated painful right hallux nail medial border that is irritated in the corner and creating distal redness. I did not note any drainage currently and patient was found to have good digital perfusion    Assessment:   Possibility for hallux trauma or other bone pathology with ingrown toenail also present      Plan:    H&P condition reviewed and were to focus on the ingrown toenail first. I infiltrated 60 mg like Marcaine mixture remove the corner exposed matrix and applied phenol 3 applications 30 seconds followed by alcohol lavaged sterile dressing. Gave instructions on soaks and reappoint  X-ray was negative for signs of bone fracture or other pathology from a structural standpoint

## 2016-10-16 NOTE — Patient Instructions (Addendum)

## 2016-11-01 DIAGNOSIS — Z23 Encounter for immunization: Secondary | ICD-10-CM | POA: Diagnosis not present

## 2016-12-04 DIAGNOSIS — M25522 Pain in left elbow: Secondary | ICD-10-CM | POA: Diagnosis not present

## 2017-02-18 DIAGNOSIS — E038 Other specified hypothyroidism: Secondary | ICD-10-CM | POA: Diagnosis not present

## 2017-02-18 DIAGNOSIS — E119 Type 2 diabetes mellitus without complications: Secondary | ICD-10-CM | POA: Diagnosis not present

## 2017-02-18 DIAGNOSIS — Z125 Encounter for screening for malignant neoplasm of prostate: Secondary | ICD-10-CM | POA: Diagnosis not present

## 2017-02-18 DIAGNOSIS — M10062 Idiopathic gout, left knee: Secondary | ICD-10-CM | POA: Diagnosis not present

## 2017-02-18 DIAGNOSIS — E7849 Other hyperlipidemia: Secondary | ICD-10-CM | POA: Diagnosis not present

## 2017-02-18 DIAGNOSIS — N183 Chronic kidney disease, stage 3 (moderate): Secondary | ICD-10-CM | POA: Diagnosis not present

## 2017-02-18 DIAGNOSIS — R82998 Other abnormal findings in urine: Secondary | ICD-10-CM | POA: Diagnosis not present

## 2017-02-24 DIAGNOSIS — Z6828 Body mass index (BMI) 28.0-28.9, adult: Secondary | ICD-10-CM | POA: Diagnosis not present

## 2017-02-24 DIAGNOSIS — E7849 Other hyperlipidemia: Secondary | ICD-10-CM | POA: Diagnosis not present

## 2017-02-24 DIAGNOSIS — I82402 Acute embolism and thrombosis of unspecified deep veins of left lower extremity: Secondary | ICD-10-CM | POA: Diagnosis not present

## 2017-02-24 DIAGNOSIS — Z0000 Encounter for general adult medical examination without abnormal findings: Secondary | ICD-10-CM | POA: Diagnosis not present

## 2017-02-24 DIAGNOSIS — N401 Enlarged prostate with lower urinary tract symptoms: Secondary | ICD-10-CM | POA: Diagnosis not present

## 2017-02-24 DIAGNOSIS — E038 Other specified hypothyroidism: Secondary | ICD-10-CM | POA: Diagnosis not present

## 2017-02-24 DIAGNOSIS — M109 Gout, unspecified: Secondary | ICD-10-CM | POA: Diagnosis not present

## 2017-02-24 DIAGNOSIS — M00869 Arthritis due to other bacteria, unspecified knee: Secondary | ICD-10-CM | POA: Diagnosis not present

## 2017-02-24 DIAGNOSIS — I10 Essential (primary) hypertension: Secondary | ICD-10-CM | POA: Diagnosis not present

## 2017-02-24 DIAGNOSIS — N183 Chronic kidney disease, stage 3 (moderate): Secondary | ICD-10-CM | POA: Diagnosis not present

## 2017-02-24 DIAGNOSIS — Z1389 Encounter for screening for other disorder: Secondary | ICD-10-CM | POA: Diagnosis not present

## 2017-02-24 DIAGNOSIS — E119 Type 2 diabetes mellitus without complications: Secondary | ICD-10-CM | POA: Diagnosis not present

## 2017-02-26 DIAGNOSIS — Z1212 Encounter for screening for malignant neoplasm of rectum: Secondary | ICD-10-CM | POA: Diagnosis not present

## 2017-07-20 DIAGNOSIS — I10 Essential (primary) hypertension: Secondary | ICD-10-CM | POA: Diagnosis not present

## 2017-07-20 DIAGNOSIS — J209 Acute bronchitis, unspecified: Secondary | ICD-10-CM | POA: Diagnosis not present

## 2017-07-20 DIAGNOSIS — E119 Type 2 diabetes mellitus without complications: Secondary | ICD-10-CM | POA: Diagnosis not present

## 2017-08-25 DIAGNOSIS — E119 Type 2 diabetes mellitus without complications: Secondary | ICD-10-CM | POA: Diagnosis not present

## 2017-08-25 DIAGNOSIS — I10 Essential (primary) hypertension: Secondary | ICD-10-CM | POA: Diagnosis not present

## 2017-08-25 DIAGNOSIS — N183 Chronic kidney disease, stage 3 (moderate): Secondary | ICD-10-CM | POA: Diagnosis not present

## 2017-08-25 DIAGNOSIS — Z6827 Body mass index (BMI) 27.0-27.9, adult: Secondary | ICD-10-CM | POA: Diagnosis not present

## 2017-08-25 DIAGNOSIS — J208 Acute bronchitis due to other specified organisms: Secondary | ICD-10-CM | POA: Diagnosis not present

## 2017-08-25 DIAGNOSIS — I82402 Acute embolism and thrombosis of unspecified deep veins of left lower extremity: Secondary | ICD-10-CM | POA: Diagnosis not present

## 2017-11-16 DIAGNOSIS — Z23 Encounter for immunization: Secondary | ICD-10-CM | POA: Diagnosis not present

## 2017-11-24 DIAGNOSIS — D225 Melanocytic nevi of trunk: Secondary | ICD-10-CM | POA: Diagnosis not present

## 2017-11-24 DIAGNOSIS — Z85828 Personal history of other malignant neoplasm of skin: Secondary | ICD-10-CM | POA: Diagnosis not present

## 2017-11-24 DIAGNOSIS — D1801 Hemangioma of skin and subcutaneous tissue: Secondary | ICD-10-CM | POA: Diagnosis not present

## 2017-11-24 DIAGNOSIS — L821 Other seborrheic keratosis: Secondary | ICD-10-CM | POA: Diagnosis not present

## 2018-03-01 IMAGING — MR MR LUMBAR SPINE WO/W CM
4 of 9 series · 17 of 48 positions shown · IV contrast (Yes)
Comparison: CT lumbar myelogram 06/13/2013.

CLINICAL DATA: 75-year-old male with septic knee joint and
suspected bacteremia. Fever and chills. Severe low back pain,
maximal in the sacral/buttock area. Prior lumbar spine surgery.
Initial encounter.

EXAM:
MRI LUMBAR SPINE WITHOUT AND WITH CONTRAST
TECHNIQUE: Multiplanar and multiecho pulse sequences of the lumbar spine were
obtained without and with intravenous contrast.
CONTRAST:  20mL MULTIHANCE GADOBENATE DIMEGLUMINE 529 MG/ML IV SOLN

[Series 3: T1 · sagittal · 4.0mm · 0.68mm/px · 3 of 15 slices shown (1 of 2)]
[im 1/15]
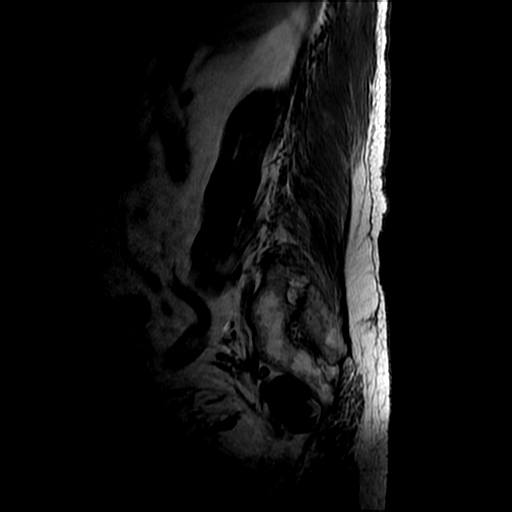
[im 10/15]
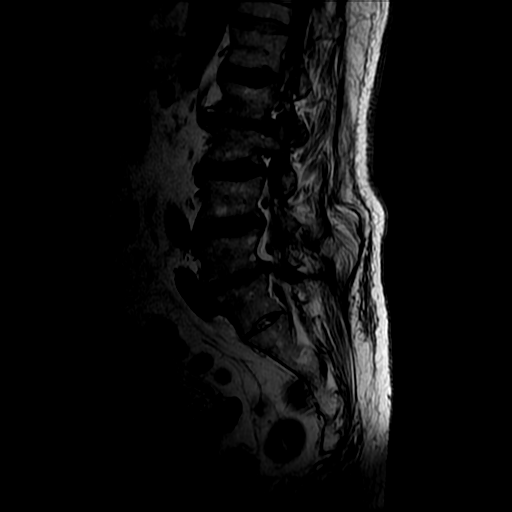
[im 15/15]
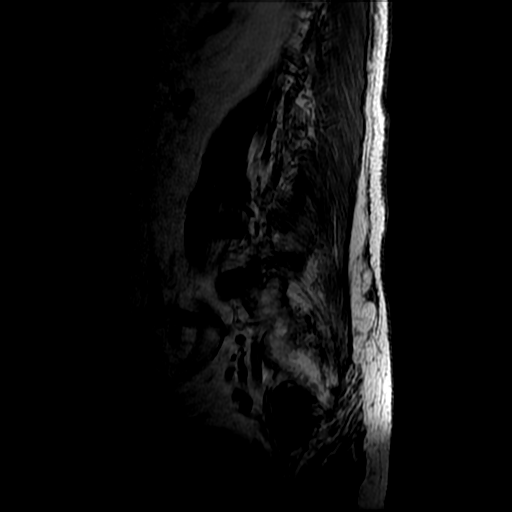

[Series 5: T2 · axial · 4.0mm · 0.39mm/px · z∈[-56,+166]mm · 8 of 39 slices shown]
[im 1/39]
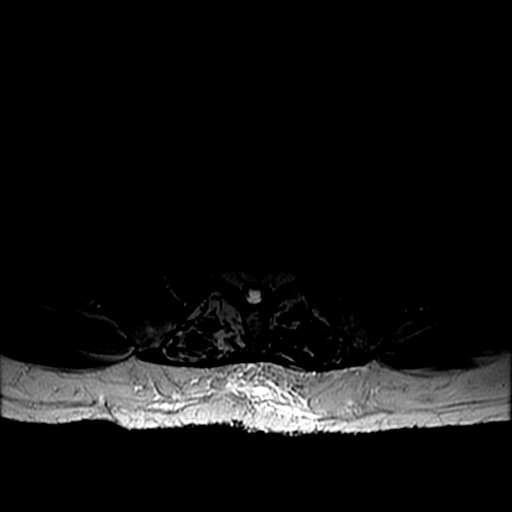
[im 6/39]
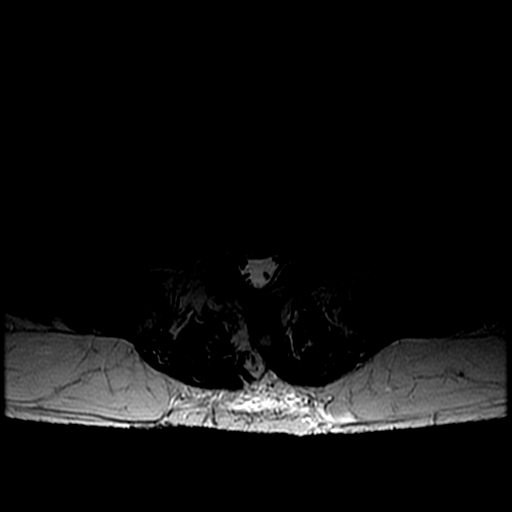
[im 11/39]
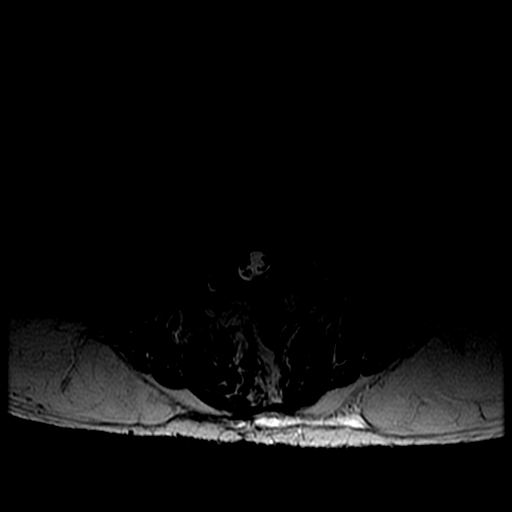
[im 17/39]
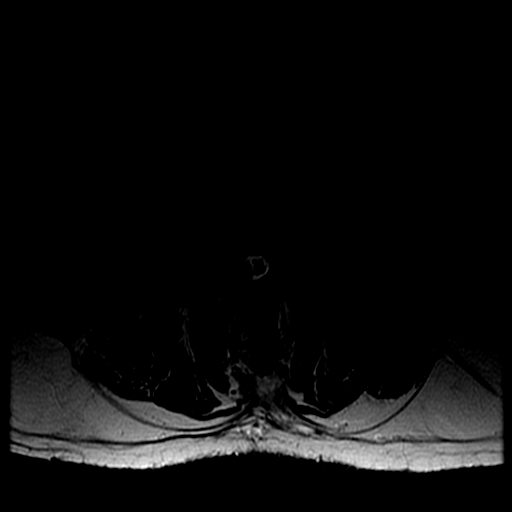
[im 22/39]
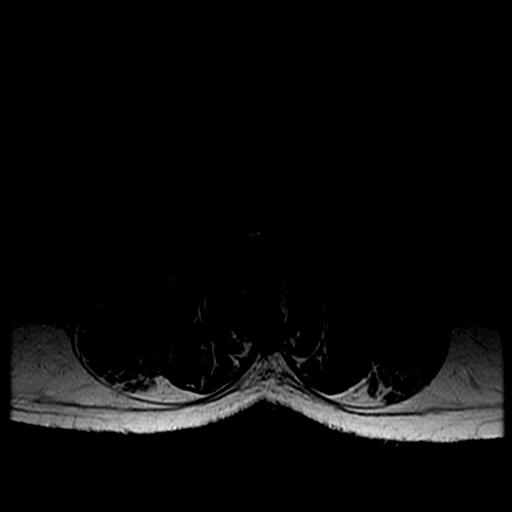
[im 28/39]
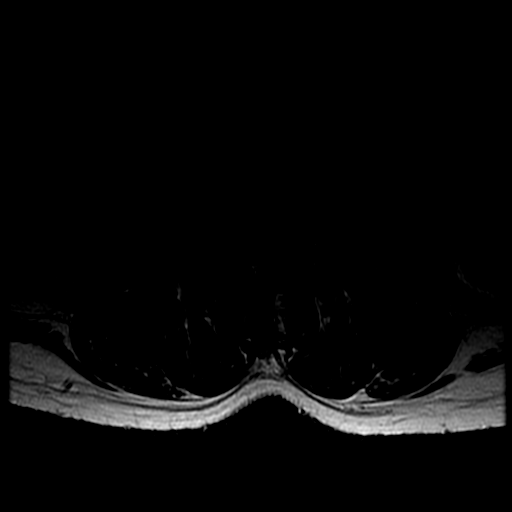
[im 33/39]
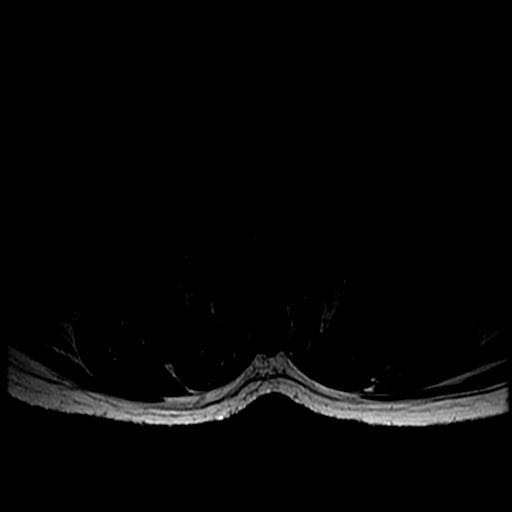
[im 39/39]
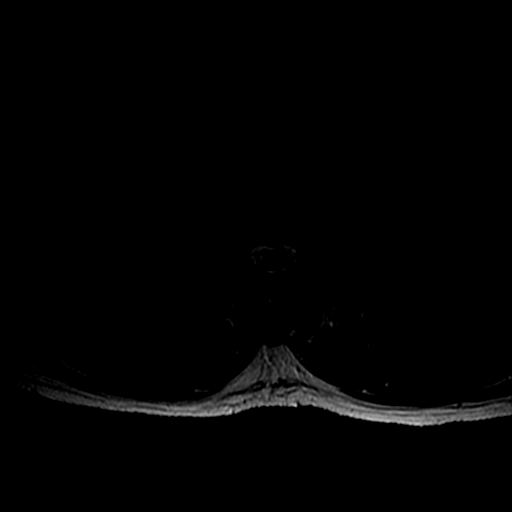

[Series 6: T1 · axial · 4.0mm · 0.39mm/px · z∈[-31,+136]mm · 3 of 39 slices shown (2 of 2)]
[im 6/39]
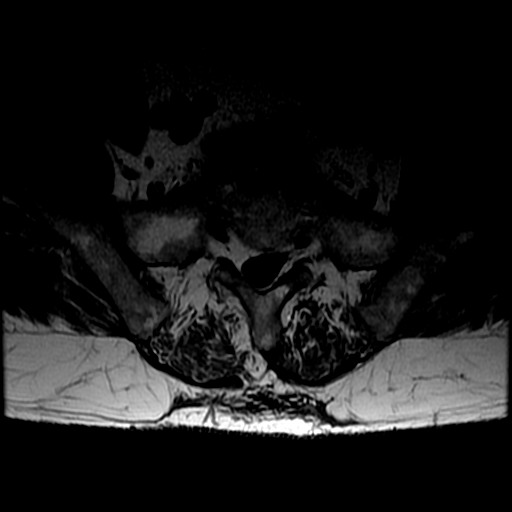
[im 22/39]
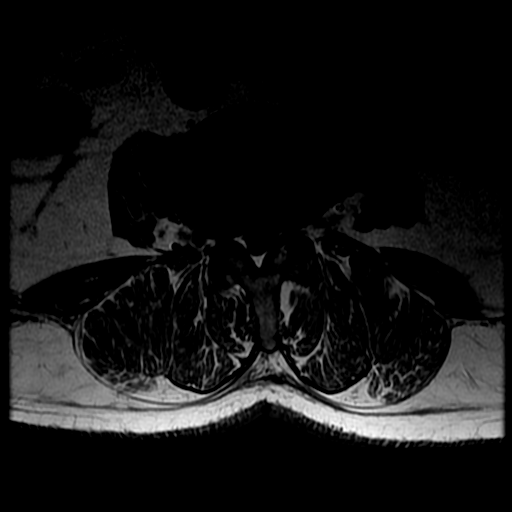
[im 33/39]
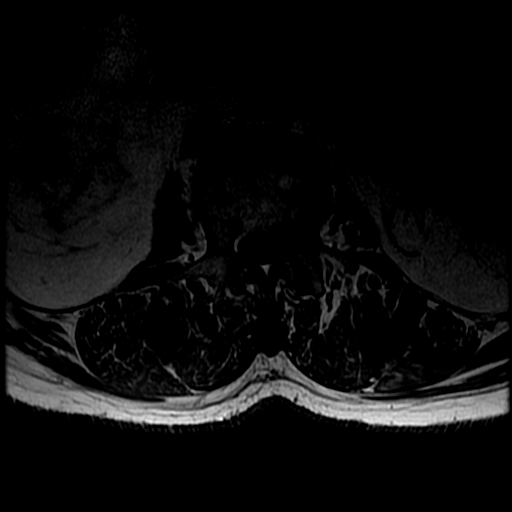

[Series 8: T2 post-contrast · sagittal · 4.0mm · 0.68mm/px · 3 of 15 slices shown]
[im 1/15]
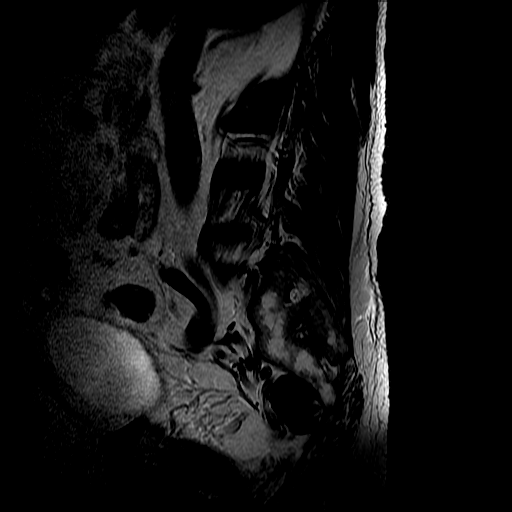
[im 8/15]
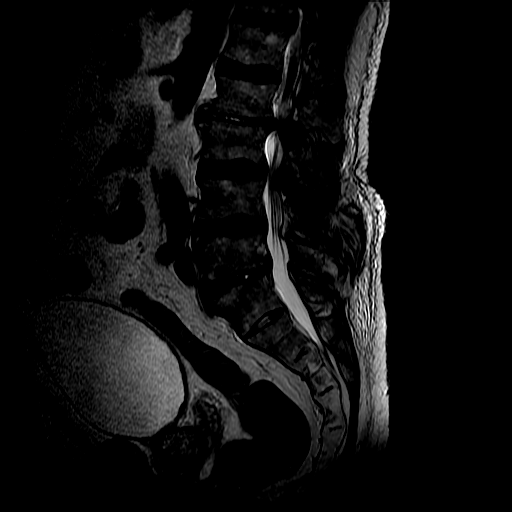
[im 15/15]
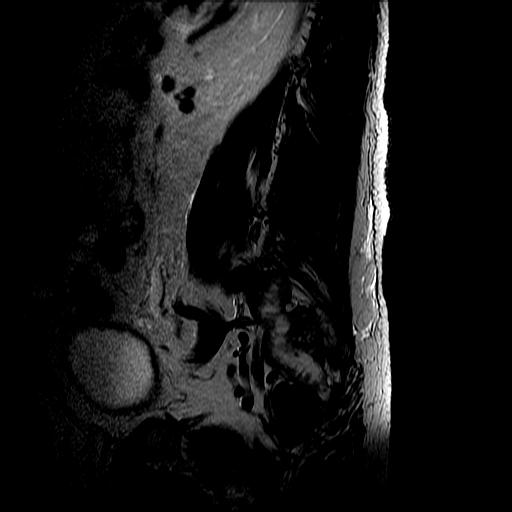

[17 of 48 positions shown; findings below may reference images not displayed]

FINDINGS: Segmentation: Normal as demonstrated on the comparison CT myelogram.
There is a vestigial S1-S2 disc space, but the S1 level is fully
sacralized.

Alignment: Mild dextroconvex lumbar scoliosis. Overall stable
vertebral height and alignment since 6095.

Vertebrae: Chronic degenerative endplate changes at L2-L3 and L5-S1.
Trace if any associated marrow edema appears degenerative in nature.
No abnormal enhancement identified. No evidence of lumbar
osteomyelitis.

Lower field of view was utilized in order to visualize the sacrum.
Additional coronal STIR imaging of the sacrum was also obtained
(series 7). Sacral bone marrow signal is normal and both SI joints
are normal. Partially visible iliac wings and bilateral hip joints
also appear normal.

Conus medullaris: Extends to the L1-L2 level and appears normal. No
abnormal intradural enhancement.

Paraspinal and other soft tissues: Mild distention of the urinary
bladder. Mild ectasia of the seminal vesicles. Nonspecific
perinephric stranding which can be age or renal insufficiency
related.

The lumbar posterior paraspinal muscles and soft tissues are normal.

Posterior to the lower sacrum there is bilateral but slightly left
worse than right increased STIR signal and enhancement in the medial
gluteal musculature (series 7, image 12). No associated
intramuscular abscess. The overlying subcutaneous soft tissues are
normal except for lumbosacral junction level subcutaneous edema
which is nonspecific but probably unrelated.

Disc levels:

Lumbar spine degeneration notable for the following.

L2-L3: Severe chronic disc space loss with vacuum disc.
Circumferential disc osteophyte complex with posterior element
hypertrophy. Chronic but increased moderate to severe spinal and
left lateral recess stenosis (series 5, image 12). Mild to moderate
left L2 foraminal stenosis.

L3-L4: Mild multifactorial spinal stenosis related to
circumferential disc bulge and moderate posterior element
hypertrophy.

L5-S1: Chronic postoperative changes to the right lamina. Chronic
severe disc space loss and vacuum disc. Circumferential disc
osteophyte complex broad-based posterior component. Severe residual
facet hypertrophy with trace facet joint fluid. No spinal stenosis
following decompression. Architectural distortion at the right
lateral recess. Up to mild left lateral recess stenosis. Moderate to
severe bilateral L5 foraminal stenosis appears primarily related to
disc and endplate spurring and unchanged since 6095.
IMPRESSION: 1. Negative for lumbosacral discitis or osteomyelitis, and normal SI
joints.
2. Acute myositis of the bilateral medial gluteal muscles which
could be infectious or inflammatory. No associated intramuscular
abscess.
3. Chronic lumbar spine degeneration and postoperative changes with
mild progression since 6095. Moderate to severe spinal and left
lateral recess stenosis at L2-L3.

## 2018-03-01 IMAGING — CR DG KNEE COMPLETE 4+V*L*
4 series · 4 of 4 positions shown · non-contrast
Comparison: None.

CLINICAL DATA: Patient fell. Popliteal pain. Unable to bear weight.
LEFT leg gave out.

EXAM:
LEFT KNEE - COMPLETE 4+ VIEW

[x knee ap left (1 of 3)]
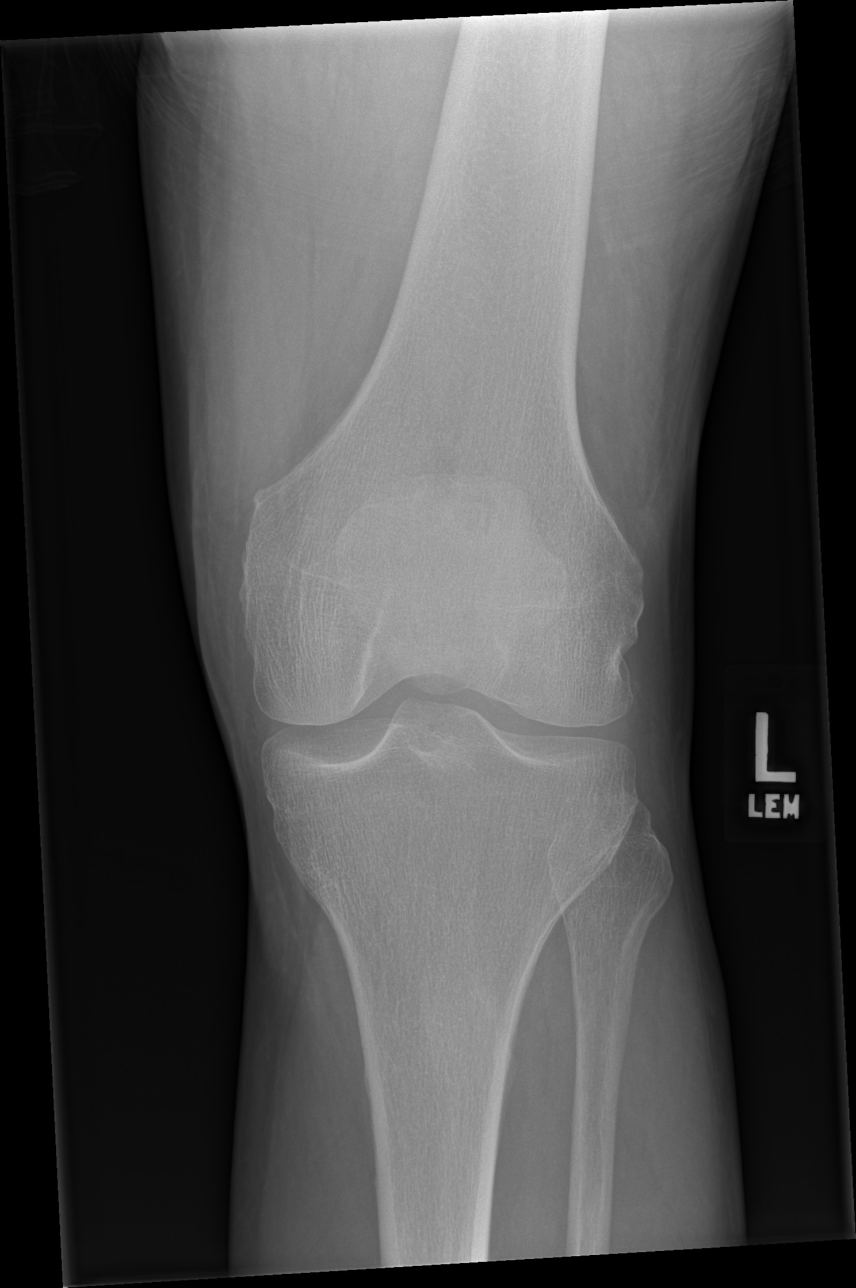

[x knee ap left (2 of 3)]
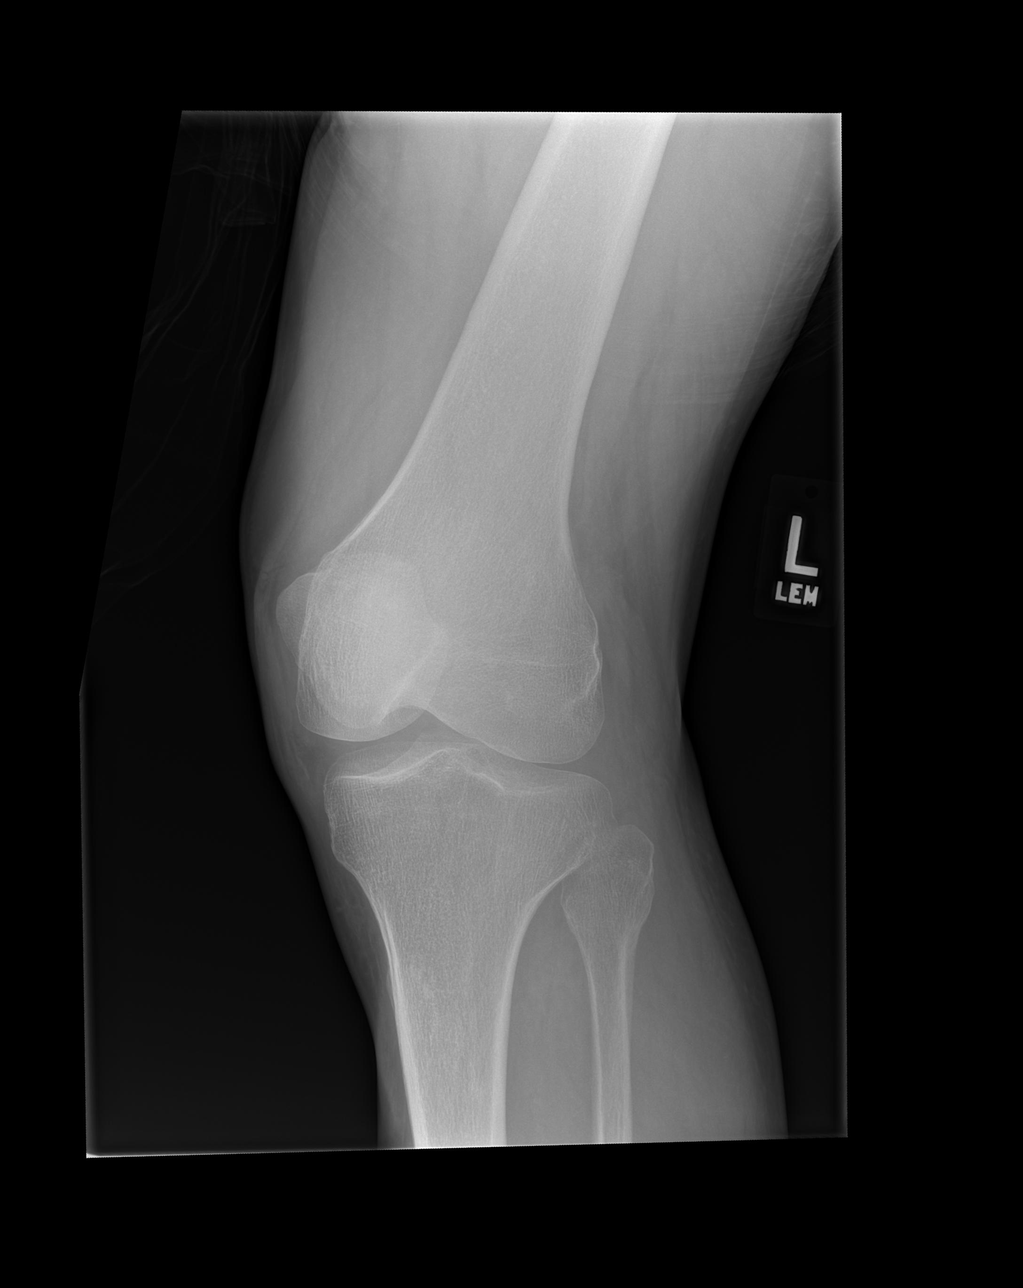

[x knee ap left (3 of 3)]
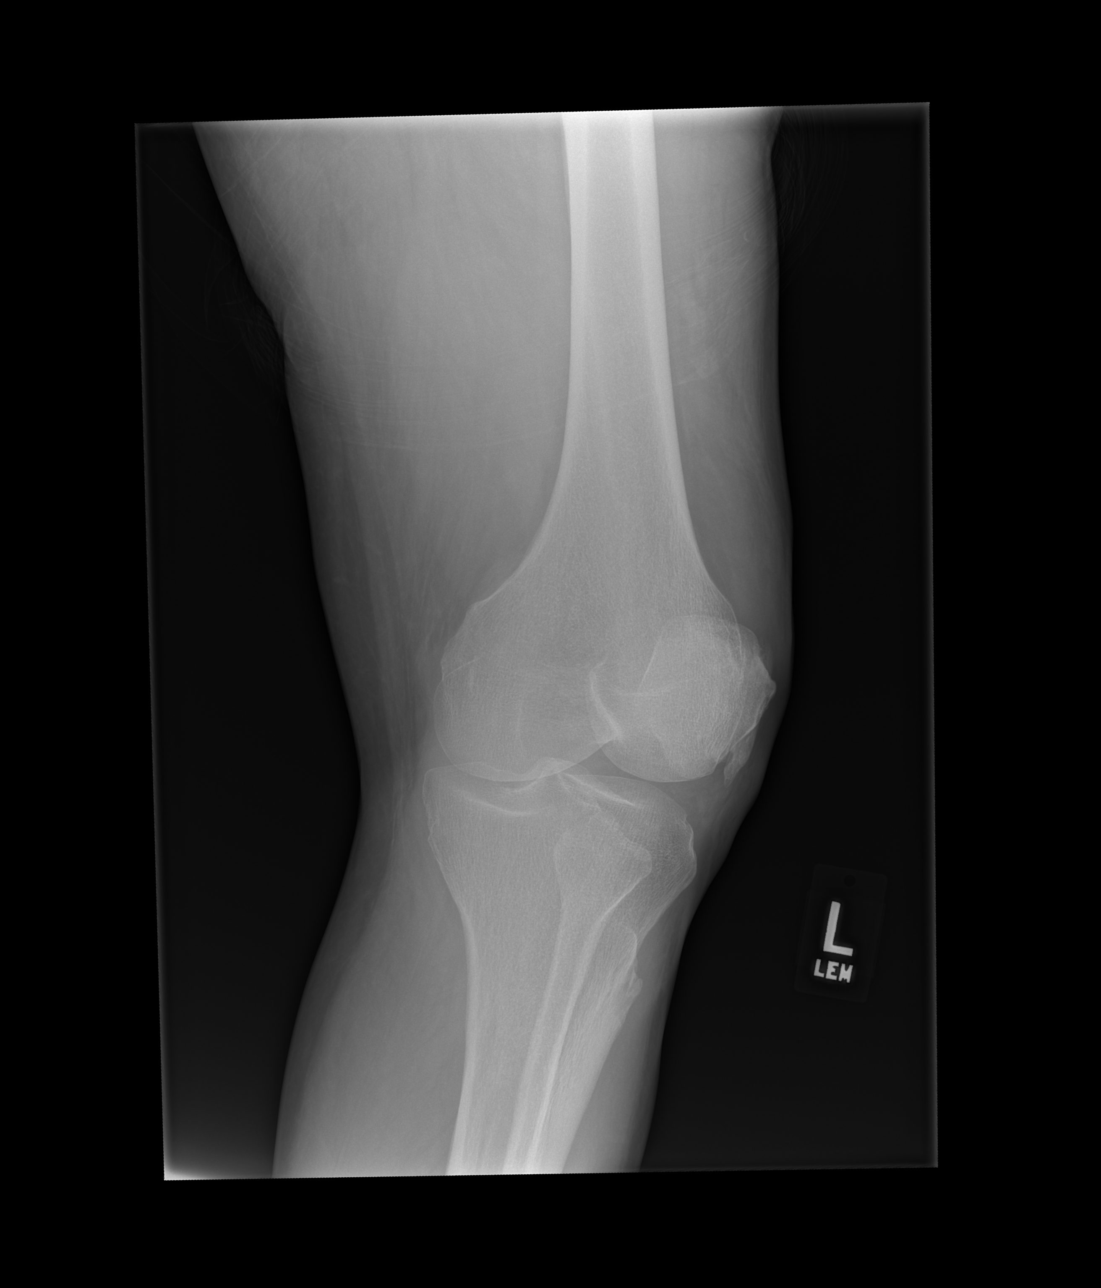

[x knee lat left]
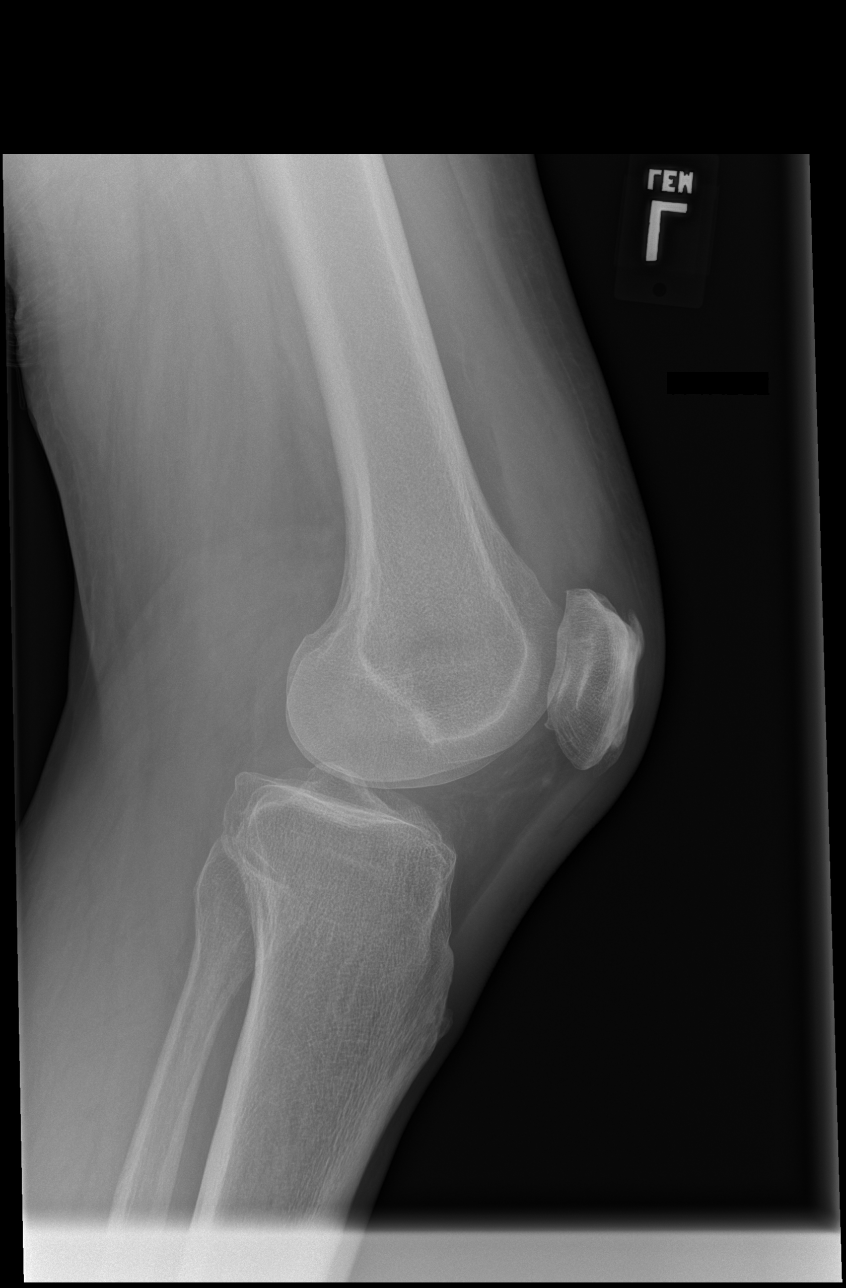

[4 of 4 positions shown; findings below may reference images not displayed]

FINDINGS: No fracture is seen. There is a moderate suprapatellar bursa
effusion. Patellar spurs are noted.
IMPRESSION: Moderate effusion.  No fracture.

## 2018-03-16 DIAGNOSIS — Z125 Encounter for screening for malignant neoplasm of prostate: Secondary | ICD-10-CM | POA: Diagnosis not present

## 2018-03-16 DIAGNOSIS — M109 Gout, unspecified: Secondary | ICD-10-CM | POA: Diagnosis not present

## 2018-03-16 DIAGNOSIS — E038 Other specified hypothyroidism: Secondary | ICD-10-CM | POA: Diagnosis not present

## 2018-03-16 DIAGNOSIS — E7849 Other hyperlipidemia: Secondary | ICD-10-CM | POA: Diagnosis not present

## 2018-03-16 DIAGNOSIS — E119 Type 2 diabetes mellitus without complications: Secondary | ICD-10-CM | POA: Diagnosis not present

## 2018-03-16 DIAGNOSIS — N183 Chronic kidney disease, stage 3 (moderate): Secondary | ICD-10-CM | POA: Diagnosis not present

## 2018-03-16 DIAGNOSIS — R82998 Other abnormal findings in urine: Secondary | ICD-10-CM | POA: Diagnosis not present

## 2018-03-23 DIAGNOSIS — Z6826 Body mass index (BMI) 26.0-26.9, adult: Secondary | ICD-10-CM | POA: Diagnosis not present

## 2018-03-23 DIAGNOSIS — N401 Enlarged prostate with lower urinary tract symptoms: Secondary | ICD-10-CM | POA: Diagnosis not present

## 2018-03-23 DIAGNOSIS — Z0000 Encounter for general adult medical examination without abnormal findings: Secondary | ICD-10-CM | POA: Diagnosis not present

## 2018-03-23 DIAGNOSIS — M109 Gout, unspecified: Secondary | ICD-10-CM | POA: Diagnosis not present

## 2018-03-23 DIAGNOSIS — E038 Other specified hypothyroidism: Secondary | ICD-10-CM | POA: Diagnosis not present

## 2018-03-23 DIAGNOSIS — N183 Chronic kidney disease, stage 3 (moderate): Secondary | ICD-10-CM | POA: Diagnosis not present

## 2018-03-23 DIAGNOSIS — E119 Type 2 diabetes mellitus without complications: Secondary | ICD-10-CM | POA: Diagnosis not present

## 2018-03-23 DIAGNOSIS — I10 Essential (primary) hypertension: Secondary | ICD-10-CM | POA: Diagnosis not present

## 2018-03-23 DIAGNOSIS — E7849 Other hyperlipidemia: Secondary | ICD-10-CM | POA: Diagnosis not present

## 2018-03-23 DIAGNOSIS — Z1331 Encounter for screening for depression: Secondary | ICD-10-CM | POA: Diagnosis not present

## 2018-09-22 DIAGNOSIS — M109 Gout, unspecified: Secondary | ICD-10-CM | POA: Diagnosis not present

## 2018-09-22 DIAGNOSIS — I129 Hypertensive chronic kidney disease with stage 1 through stage 4 chronic kidney disease, or unspecified chronic kidney disease: Secondary | ICD-10-CM | POA: Diagnosis not present

## 2018-09-22 DIAGNOSIS — N529 Male erectile dysfunction, unspecified: Secondary | ICD-10-CM | POA: Diagnosis not present

## 2018-09-22 DIAGNOSIS — E785 Hyperlipidemia, unspecified: Secondary | ICD-10-CM | POA: Diagnosis not present

## 2018-09-22 DIAGNOSIS — N183 Chronic kidney disease, stage 3 (moderate): Secondary | ICD-10-CM | POA: Diagnosis not present

## 2018-09-22 DIAGNOSIS — E119 Type 2 diabetes mellitus without complications: Secondary | ICD-10-CM | POA: Diagnosis not present

## 2018-09-22 DIAGNOSIS — E039 Hypothyroidism, unspecified: Secondary | ICD-10-CM | POA: Diagnosis not present

## 2018-09-24 DIAGNOSIS — E119 Type 2 diabetes mellitus without complications: Secondary | ICD-10-CM | POA: Diagnosis not present

## 2018-12-10 DIAGNOSIS — Z23 Encounter for immunization: Secondary | ICD-10-CM | POA: Diagnosis not present

## 2019-03-02 ENCOUNTER — Ambulatory Visit: Payer: Medicare Other

## 2019-03-25 DIAGNOSIS — E119 Type 2 diabetes mellitus without complications: Secondary | ICD-10-CM | POA: Diagnosis not present

## 2019-03-25 DIAGNOSIS — E039 Hypothyroidism, unspecified: Secondary | ICD-10-CM | POA: Diagnosis not present

## 2019-03-25 DIAGNOSIS — Z125 Encounter for screening for malignant neoplasm of prostate: Secondary | ICD-10-CM | POA: Diagnosis not present

## 2019-03-25 DIAGNOSIS — M109 Gout, unspecified: Secondary | ICD-10-CM | POA: Diagnosis not present

## 2019-03-25 DIAGNOSIS — E7849 Other hyperlipidemia: Secondary | ICD-10-CM | POA: Diagnosis not present

## 2019-03-28 DIAGNOSIS — R82998 Other abnormal findings in urine: Secondary | ICD-10-CM | POA: Diagnosis not present

## 2019-03-30 DIAGNOSIS — N183 Chronic kidney disease, stage 3 unspecified: Secondary | ICD-10-CM | POA: Diagnosis not present

## 2019-03-30 DIAGNOSIS — E785 Hyperlipidemia, unspecified: Secondary | ICD-10-CM | POA: Diagnosis not present

## 2019-03-30 DIAGNOSIS — E039 Hypothyroidism, unspecified: Secondary | ICD-10-CM | POA: Diagnosis not present

## 2019-03-30 DIAGNOSIS — Z0000 Encounter for general adult medical examination without abnormal findings: Secondary | ICD-10-CM | POA: Diagnosis not present

## 2019-03-30 DIAGNOSIS — E119 Type 2 diabetes mellitus without complications: Secondary | ICD-10-CM | POA: Diagnosis not present

## 2019-03-30 DIAGNOSIS — I10 Essential (primary) hypertension: Secondary | ICD-10-CM | POA: Diagnosis not present

## 2019-03-30 DIAGNOSIS — M109 Gout, unspecified: Secondary | ICD-10-CM | POA: Diagnosis not present

## 2019-03-30 DIAGNOSIS — M48061 Spinal stenosis, lumbar region without neurogenic claudication: Secondary | ICD-10-CM | POA: Diagnosis not present

## 2019-03-30 DIAGNOSIS — N401 Enlarged prostate with lower urinary tract symptoms: Secondary | ICD-10-CM | POA: Diagnosis not present

## 2019-03-30 DIAGNOSIS — Z1339 Encounter for screening examination for other mental health and behavioral disorders: Secondary | ICD-10-CM | POA: Diagnosis not present

## 2019-03-30 DIAGNOSIS — Z1331 Encounter for screening for depression: Secondary | ICD-10-CM | POA: Diagnosis not present

## 2019-04-06 DIAGNOSIS — Z1212 Encounter for screening for malignant neoplasm of rectum: Secondary | ICD-10-CM | POA: Diagnosis not present

## 2019-08-30 DIAGNOSIS — Z85828 Personal history of other malignant neoplasm of skin: Secondary | ICD-10-CM | POA: Diagnosis not present

## 2019-08-30 DIAGNOSIS — L821 Other seborrheic keratosis: Secondary | ICD-10-CM | POA: Diagnosis not present

## 2019-08-30 DIAGNOSIS — L570 Actinic keratosis: Secondary | ICD-10-CM | POA: Diagnosis not present

## 2019-10-28 DIAGNOSIS — I10 Essential (primary) hypertension: Secondary | ICD-10-CM | POA: Diagnosis not present

## 2019-10-28 DIAGNOSIS — N401 Enlarged prostate with lower urinary tract symptoms: Secondary | ICD-10-CM | POA: Diagnosis not present

## 2019-10-28 DIAGNOSIS — E039 Hypothyroidism, unspecified: Secondary | ICD-10-CM | POA: Diagnosis not present

## 2019-10-28 DIAGNOSIS — Z23 Encounter for immunization: Secondary | ICD-10-CM | POA: Diagnosis not present

## 2019-10-28 DIAGNOSIS — E119 Type 2 diabetes mellitus without complications: Secondary | ICD-10-CM | POA: Diagnosis not present

## 2019-10-28 DIAGNOSIS — M48061 Spinal stenosis, lumbar region without neurogenic claudication: Secondary | ICD-10-CM | POA: Diagnosis not present

## 2019-10-28 DIAGNOSIS — M109 Gout, unspecified: Secondary | ICD-10-CM | POA: Diagnosis not present

## 2019-10-28 DIAGNOSIS — E785 Hyperlipidemia, unspecified: Secondary | ICD-10-CM | POA: Diagnosis not present

## 2020-01-05 DIAGNOSIS — Z23 Encounter for immunization: Secondary | ICD-10-CM | POA: Diagnosis not present

## 2020-02-21 DIAGNOSIS — Z1152 Encounter for screening for COVID-19: Secondary | ICD-10-CM | POA: Diagnosis not present

## 2020-02-21 DIAGNOSIS — J189 Pneumonia, unspecified organism: Secondary | ICD-10-CM | POA: Diagnosis not present

## 2020-02-21 DIAGNOSIS — I10 Essential (primary) hypertension: Secondary | ICD-10-CM | POA: Diagnosis not present

## 2020-02-21 DIAGNOSIS — E119 Type 2 diabetes mellitus without complications: Secondary | ICD-10-CM | POA: Diagnosis not present

## 2020-02-21 DIAGNOSIS — R059 Cough, unspecified: Secondary | ICD-10-CM | POA: Diagnosis not present

## 2020-02-21 DIAGNOSIS — U071 COVID-19: Secondary | ICD-10-CM | POA: Diagnosis not present

## 2020-02-21 DIAGNOSIS — E785 Hyperlipidemia, unspecified: Secondary | ICD-10-CM | POA: Diagnosis not present

## 2020-03-12 DIAGNOSIS — L821 Other seborrheic keratosis: Secondary | ICD-10-CM | POA: Diagnosis not present

## 2020-03-12 DIAGNOSIS — Z85828 Personal history of other malignant neoplasm of skin: Secondary | ICD-10-CM | POA: Diagnosis not present

## 2020-03-12 DIAGNOSIS — D1801 Hemangioma of skin and subcutaneous tissue: Secondary | ICD-10-CM | POA: Diagnosis not present

## 2020-03-12 DIAGNOSIS — L570 Actinic keratosis: Secondary | ICD-10-CM | POA: Diagnosis not present

## 2020-03-30 DIAGNOSIS — E785 Hyperlipidemia, unspecified: Secondary | ICD-10-CM | POA: Diagnosis not present

## 2020-03-30 DIAGNOSIS — Z125 Encounter for screening for malignant neoplasm of prostate: Secondary | ICD-10-CM | POA: Diagnosis not present

## 2020-03-30 DIAGNOSIS — E039 Hypothyroidism, unspecified: Secondary | ICD-10-CM | POA: Diagnosis not present

## 2020-03-30 DIAGNOSIS — M109 Gout, unspecified: Secondary | ICD-10-CM | POA: Diagnosis not present

## 2020-03-30 DIAGNOSIS — E119 Type 2 diabetes mellitus without complications: Secondary | ICD-10-CM | POA: Diagnosis not present

## 2020-04-06 DIAGNOSIS — J189 Pneumonia, unspecified organism: Secondary | ICD-10-CM | POA: Diagnosis not present

## 2020-04-06 DIAGNOSIS — K5790 Diverticulosis of intestine, part unspecified, without perforation or abscess without bleeding: Secondary | ICD-10-CM | POA: Diagnosis not present

## 2020-04-06 DIAGNOSIS — E785 Hyperlipidemia, unspecified: Secondary | ICD-10-CM | POA: Diagnosis not present

## 2020-04-06 DIAGNOSIS — U071 COVID-19: Secondary | ICD-10-CM | POA: Diagnosis not present

## 2020-04-06 DIAGNOSIS — E119 Type 2 diabetes mellitus without complications: Secondary | ICD-10-CM | POA: Diagnosis not present

## 2020-04-06 DIAGNOSIS — N401 Enlarged prostate with lower urinary tract symptoms: Secondary | ICD-10-CM | POA: Diagnosis not present

## 2020-04-06 DIAGNOSIS — I10 Essential (primary) hypertension: Secondary | ICD-10-CM | POA: Diagnosis not present

## 2020-04-06 DIAGNOSIS — R82998 Other abnormal findings in urine: Secondary | ICD-10-CM | POA: Diagnosis not present

## 2020-04-06 DIAGNOSIS — Z1212 Encounter for screening for malignant neoplasm of rectum: Secondary | ICD-10-CM | POA: Diagnosis not present

## 2020-04-06 DIAGNOSIS — Z0000 Encounter for general adult medical examination without abnormal findings: Secondary | ICD-10-CM | POA: Diagnosis not present

## 2020-04-06 DIAGNOSIS — M48061 Spinal stenosis, lumbar region without neurogenic claudication: Secondary | ICD-10-CM | POA: Diagnosis not present

## 2020-04-06 DIAGNOSIS — M109 Gout, unspecified: Secondary | ICD-10-CM | POA: Diagnosis not present

## 2020-04-06 DIAGNOSIS — E039 Hypothyroidism, unspecified: Secondary | ICD-10-CM | POA: Diagnosis not present

## 2020-08-21 DIAGNOSIS — M545 Low back pain, unspecified: Secondary | ICD-10-CM | POA: Diagnosis not present

## 2020-10-04 DIAGNOSIS — E119 Type 2 diabetes mellitus without complications: Secondary | ICD-10-CM | POA: Diagnosis not present

## 2020-10-04 DIAGNOSIS — I10 Essential (primary) hypertension: Secondary | ICD-10-CM | POA: Diagnosis not present

## 2020-10-04 DIAGNOSIS — M109 Gout, unspecified: Secondary | ICD-10-CM | POA: Diagnosis not present

## 2020-10-04 DIAGNOSIS — E785 Hyperlipidemia, unspecified: Secondary | ICD-10-CM | POA: Diagnosis not present

## 2020-10-04 DIAGNOSIS — K5790 Diverticulosis of intestine, part unspecified, without perforation or abscess without bleeding: Secondary | ICD-10-CM | POA: Diagnosis not present

## 2020-10-04 DIAGNOSIS — M48061 Spinal stenosis, lumbar region without neurogenic claudication: Secondary | ICD-10-CM | POA: Diagnosis not present

## 2020-10-04 DIAGNOSIS — N401 Enlarged prostate with lower urinary tract symptoms: Secondary | ICD-10-CM | POA: Diagnosis not present

## 2020-10-04 DIAGNOSIS — E039 Hypothyroidism, unspecified: Secondary | ICD-10-CM | POA: Diagnosis not present

## 2020-11-19 DIAGNOSIS — Z23 Encounter for immunization: Secondary | ICD-10-CM | POA: Diagnosis not present

## 2020-12-10 DIAGNOSIS — Z23 Encounter for immunization: Secondary | ICD-10-CM | POA: Diagnosis not present

## 2021-02-01 DIAGNOSIS — M545 Low back pain, unspecified: Secondary | ICD-10-CM | POA: Diagnosis not present

## 2021-03-12 DIAGNOSIS — E785 Hyperlipidemia, unspecified: Secondary | ICD-10-CM | POA: Diagnosis not present

## 2021-03-12 DIAGNOSIS — E039 Hypothyroidism, unspecified: Secondary | ICD-10-CM | POA: Diagnosis not present

## 2021-03-12 DIAGNOSIS — N401 Enlarged prostate with lower urinary tract symptoms: Secondary | ICD-10-CM | POA: Diagnosis not present

## 2021-03-12 DIAGNOSIS — M48061 Spinal stenosis, lumbar region without neurogenic claudication: Secondary | ICD-10-CM | POA: Diagnosis not present

## 2021-03-12 DIAGNOSIS — M109 Gout, unspecified: Secondary | ICD-10-CM | POA: Diagnosis not present

## 2021-03-12 DIAGNOSIS — K5790 Diverticulosis of intestine, part unspecified, without perforation or abscess without bleeding: Secondary | ICD-10-CM | POA: Diagnosis not present

## 2021-03-12 DIAGNOSIS — E119 Type 2 diabetes mellitus without complications: Secondary | ICD-10-CM | POA: Diagnosis not present

## 2021-03-12 DIAGNOSIS — I10 Essential (primary) hypertension: Secondary | ICD-10-CM | POA: Diagnosis not present

## 2021-04-01 DIAGNOSIS — D1801 Hemangioma of skin and subcutaneous tissue: Secondary | ICD-10-CM | POA: Diagnosis not present

## 2021-04-01 DIAGNOSIS — L298 Other pruritus: Secondary | ICD-10-CM | POA: Diagnosis not present

## 2021-04-01 DIAGNOSIS — L821 Other seborrheic keratosis: Secondary | ICD-10-CM | POA: Diagnosis not present

## 2021-04-01 DIAGNOSIS — Z85828 Personal history of other malignant neoplasm of skin: Secondary | ICD-10-CM | POA: Diagnosis not present

## 2021-04-25 DIAGNOSIS — I10 Essential (primary) hypertension: Secondary | ICD-10-CM | POA: Diagnosis not present

## 2021-04-25 DIAGNOSIS — M109 Gout, unspecified: Secondary | ICD-10-CM | POA: Diagnosis not present

## 2021-04-25 DIAGNOSIS — E785 Hyperlipidemia, unspecified: Secondary | ICD-10-CM | POA: Diagnosis not present

## 2021-04-25 DIAGNOSIS — Z125 Encounter for screening for malignant neoplasm of prostate: Secondary | ICD-10-CM | POA: Diagnosis not present

## 2021-04-25 DIAGNOSIS — E039 Hypothyroidism, unspecified: Secondary | ICD-10-CM | POA: Diagnosis not present

## 2021-04-25 DIAGNOSIS — E119 Type 2 diabetes mellitus without complications: Secondary | ICD-10-CM | POA: Diagnosis not present

## 2021-05-02 DIAGNOSIS — K5790 Diverticulosis of intestine, part unspecified, without perforation or abscess without bleeding: Secondary | ICD-10-CM | POA: Diagnosis not present

## 2021-05-02 DIAGNOSIS — N401 Enlarged prostate with lower urinary tract symptoms: Secondary | ICD-10-CM | POA: Diagnosis not present

## 2021-05-02 DIAGNOSIS — M48061 Spinal stenosis, lumbar region without neurogenic claudication: Secondary | ICD-10-CM | POA: Diagnosis not present

## 2021-05-02 DIAGNOSIS — E039 Hypothyroidism, unspecified: Secondary | ICD-10-CM | POA: Diagnosis not present

## 2021-05-02 DIAGNOSIS — E785 Hyperlipidemia, unspecified: Secondary | ICD-10-CM | POA: Diagnosis not present

## 2021-05-02 DIAGNOSIS — E119 Type 2 diabetes mellitus without complications: Secondary | ICD-10-CM | POA: Diagnosis not present

## 2021-05-02 DIAGNOSIS — M109 Gout, unspecified: Secondary | ICD-10-CM | POA: Diagnosis not present

## 2021-05-02 DIAGNOSIS — Z0000 Encounter for general adult medical examination without abnormal findings: Secondary | ICD-10-CM | POA: Diagnosis not present

## 2021-05-02 DIAGNOSIS — I10 Essential (primary) hypertension: Secondary | ICD-10-CM | POA: Diagnosis not present

## 2021-05-08 DIAGNOSIS — E119 Type 2 diabetes mellitus without complications: Secondary | ICD-10-CM | POA: Diagnosis not present

## 2021-05-08 DIAGNOSIS — I10 Essential (primary) hypertension: Secondary | ICD-10-CM | POA: Diagnosis not present

## 2021-05-08 DIAGNOSIS — R82998 Other abnormal findings in urine: Secondary | ICD-10-CM | POA: Diagnosis not present

## 2021-06-03 DIAGNOSIS — E119 Type 2 diabetes mellitus without complications: Secondary | ICD-10-CM | POA: Diagnosis not present

## 2021-09-03 DIAGNOSIS — E785 Hyperlipidemia, unspecified: Secondary | ICD-10-CM | POA: Diagnosis not present

## 2021-09-03 DIAGNOSIS — M48061 Spinal stenosis, lumbar region without neurogenic claudication: Secondary | ICD-10-CM | POA: Diagnosis not present

## 2021-09-03 DIAGNOSIS — E119 Type 2 diabetes mellitus without complications: Secondary | ICD-10-CM | POA: Diagnosis not present

## 2021-09-03 DIAGNOSIS — I10 Essential (primary) hypertension: Secondary | ICD-10-CM | POA: Diagnosis not present

## 2021-09-03 DIAGNOSIS — K5790 Diverticulosis of intestine, part unspecified, without perforation or abscess without bleeding: Secondary | ICD-10-CM | POA: Diagnosis not present

## 2021-09-03 DIAGNOSIS — E039 Hypothyroidism, unspecified: Secondary | ICD-10-CM | POA: Diagnosis not present

## 2021-09-03 DIAGNOSIS — M109 Gout, unspecified: Secondary | ICD-10-CM | POA: Diagnosis not present

## 2021-09-03 DIAGNOSIS — N401 Enlarged prostate with lower urinary tract symptoms: Secondary | ICD-10-CM | POA: Diagnosis not present

## 2021-11-20 DIAGNOSIS — Z23 Encounter for immunization: Secondary | ICD-10-CM | POA: Diagnosis not present

## 2022-01-20 DIAGNOSIS — M109 Gout, unspecified: Secondary | ICD-10-CM | POA: Diagnosis not present

## 2022-01-20 DIAGNOSIS — E119 Type 2 diabetes mellitus without complications: Secondary | ICD-10-CM | POA: Diagnosis not present

## 2022-01-20 DIAGNOSIS — M48061 Spinal stenosis, lumbar region without neurogenic claudication: Secondary | ICD-10-CM | POA: Diagnosis not present

## 2022-01-20 DIAGNOSIS — K5790 Diverticulosis of intestine, part unspecified, without perforation or abscess without bleeding: Secondary | ICD-10-CM | POA: Diagnosis not present

## 2022-01-20 DIAGNOSIS — I10 Essential (primary) hypertension: Secondary | ICD-10-CM | POA: Diagnosis not present

## 2022-01-20 DIAGNOSIS — N401 Enlarged prostate with lower urinary tract symptoms: Secondary | ICD-10-CM | POA: Diagnosis not present

## 2022-01-20 DIAGNOSIS — E785 Hyperlipidemia, unspecified: Secondary | ICD-10-CM | POA: Diagnosis not present

## 2022-01-20 DIAGNOSIS — E039 Hypothyroidism, unspecified: Secondary | ICD-10-CM | POA: Diagnosis not present

## 2022-05-14 DIAGNOSIS — M109 Gout, unspecified: Secondary | ICD-10-CM | POA: Diagnosis not present

## 2022-05-14 DIAGNOSIS — E039 Hypothyroidism, unspecified: Secondary | ICD-10-CM | POA: Diagnosis not present

## 2022-05-14 DIAGNOSIS — I10 Essential (primary) hypertension: Secondary | ICD-10-CM | POA: Diagnosis not present

## 2022-05-14 DIAGNOSIS — E119 Type 2 diabetes mellitus without complications: Secondary | ICD-10-CM | POA: Diagnosis not present

## 2022-05-14 DIAGNOSIS — Z125 Encounter for screening for malignant neoplasm of prostate: Secondary | ICD-10-CM | POA: Diagnosis not present

## 2022-05-14 DIAGNOSIS — E785 Hyperlipidemia, unspecified: Secondary | ICD-10-CM | POA: Diagnosis not present

## 2022-05-21 DIAGNOSIS — E039 Hypothyroidism, unspecified: Secondary | ICD-10-CM | POA: Diagnosis not present

## 2022-05-21 DIAGNOSIS — M48061 Spinal stenosis, lumbar region without neurogenic claudication: Secondary | ICD-10-CM | POA: Diagnosis not present

## 2022-05-21 DIAGNOSIS — N390 Urinary tract infection, site not specified: Secondary | ICD-10-CM | POA: Diagnosis not present

## 2022-05-21 DIAGNOSIS — Z23 Encounter for immunization: Secondary | ICD-10-CM | POA: Diagnosis not present

## 2022-05-21 DIAGNOSIS — Z1339 Encounter for screening examination for other mental health and behavioral disorders: Secondary | ICD-10-CM | POA: Diagnosis not present

## 2022-05-21 DIAGNOSIS — E119 Type 2 diabetes mellitus without complications: Secondary | ICD-10-CM | POA: Diagnosis not present

## 2022-05-21 DIAGNOSIS — M109 Gout, unspecified: Secondary | ICD-10-CM | POA: Diagnosis not present

## 2022-05-21 DIAGNOSIS — N401 Enlarged prostate with lower urinary tract symptoms: Secondary | ICD-10-CM | POA: Diagnosis not present

## 2022-05-21 DIAGNOSIS — I10 Essential (primary) hypertension: Secondary | ICD-10-CM | POA: Diagnosis not present

## 2022-05-21 DIAGNOSIS — E785 Hyperlipidemia, unspecified: Secondary | ICD-10-CM | POA: Diagnosis not present

## 2022-05-21 DIAGNOSIS — Z1331 Encounter for screening for depression: Secondary | ICD-10-CM | POA: Diagnosis not present

## 2022-05-21 DIAGNOSIS — R82998 Other abnormal findings in urine: Secondary | ICD-10-CM | POA: Diagnosis not present

## 2022-05-21 DIAGNOSIS — Z0000 Encounter for general adult medical examination without abnormal findings: Secondary | ICD-10-CM | POA: Diagnosis not present

## 2022-05-21 DIAGNOSIS — K5790 Diverticulosis of intestine, part unspecified, without perforation or abscess without bleeding: Secondary | ICD-10-CM | POA: Diagnosis not present

## 2022-05-22 DIAGNOSIS — D1801 Hemangioma of skin and subcutaneous tissue: Secondary | ICD-10-CM | POA: Diagnosis not present

## 2022-05-22 DIAGNOSIS — L821 Other seborrheic keratosis: Secondary | ICD-10-CM | POA: Diagnosis not present

## 2022-05-22 DIAGNOSIS — L570 Actinic keratosis: Secondary | ICD-10-CM | POA: Diagnosis not present

## 2022-05-22 DIAGNOSIS — Z85828 Personal history of other malignant neoplasm of skin: Secondary | ICD-10-CM | POA: Diagnosis not present

## 2022-10-03 DIAGNOSIS — E119 Type 2 diabetes mellitus without complications: Secondary | ICD-10-CM | POA: Diagnosis not present

## 2022-10-03 DIAGNOSIS — K5790 Diverticulosis of intestine, part unspecified, without perforation or abscess without bleeding: Secondary | ICD-10-CM | POA: Diagnosis not present

## 2022-10-03 DIAGNOSIS — E039 Hypothyroidism, unspecified: Secondary | ICD-10-CM | POA: Diagnosis not present

## 2022-10-03 DIAGNOSIS — M48061 Spinal stenosis, lumbar region without neurogenic claudication: Secondary | ICD-10-CM | POA: Diagnosis not present

## 2022-10-03 DIAGNOSIS — M109 Gout, unspecified: Secondary | ICD-10-CM | POA: Diagnosis not present

## 2022-10-03 DIAGNOSIS — I10 Essential (primary) hypertension: Secondary | ICD-10-CM | POA: Diagnosis not present

## 2022-10-03 DIAGNOSIS — N401 Enlarged prostate with lower urinary tract symptoms: Secondary | ICD-10-CM | POA: Diagnosis not present

## 2022-10-03 DIAGNOSIS — E785 Hyperlipidemia, unspecified: Secondary | ICD-10-CM | POA: Diagnosis not present

## 2022-10-03 DIAGNOSIS — R2681 Unsteadiness on feet: Secondary | ICD-10-CM | POA: Diagnosis not present

## 2022-10-08 DIAGNOSIS — Z1152 Encounter for screening for COVID-19: Secondary | ICD-10-CM | POA: Diagnosis not present

## 2022-10-08 DIAGNOSIS — B349 Viral infection, unspecified: Secondary | ICD-10-CM | POA: Diagnosis not present

## 2022-10-08 DIAGNOSIS — R52 Pain, unspecified: Secondary | ICD-10-CM | POA: Diagnosis not present

## 2022-10-08 DIAGNOSIS — R5383 Other fatigue: Secondary | ICD-10-CM | POA: Diagnosis not present

## 2022-10-08 DIAGNOSIS — R051 Acute cough: Secondary | ICD-10-CM | POA: Diagnosis not present

## 2022-10-08 DIAGNOSIS — R0981 Nasal congestion: Secondary | ICD-10-CM | POA: Diagnosis not present

## 2022-10-08 DIAGNOSIS — I10 Essential (primary) hypertension: Secondary | ICD-10-CM | POA: Diagnosis not present

## 2023-02-02 DIAGNOSIS — K5790 Diverticulosis of intestine, part unspecified, without perforation or abscess without bleeding: Secondary | ICD-10-CM | POA: Diagnosis not present

## 2023-02-02 DIAGNOSIS — Z23 Encounter for immunization: Secondary | ICD-10-CM | POA: Diagnosis not present

## 2023-02-02 DIAGNOSIS — K5900 Constipation, unspecified: Secondary | ICD-10-CM | POA: Diagnosis not present

## 2023-02-02 DIAGNOSIS — M48061 Spinal stenosis, lumbar region without neurogenic claudication: Secondary | ICD-10-CM | POA: Diagnosis not present

## 2023-02-02 DIAGNOSIS — M109 Gout, unspecified: Secondary | ICD-10-CM | POA: Diagnosis not present

## 2023-02-02 DIAGNOSIS — R2681 Unsteadiness on feet: Secondary | ICD-10-CM | POA: Diagnosis not present

## 2023-02-02 DIAGNOSIS — E785 Hyperlipidemia, unspecified: Secondary | ICD-10-CM | POA: Diagnosis not present

## 2023-02-02 DIAGNOSIS — I10 Essential (primary) hypertension: Secondary | ICD-10-CM | POA: Diagnosis not present

## 2023-02-02 DIAGNOSIS — N401 Enlarged prostate with lower urinary tract symptoms: Secondary | ICD-10-CM | POA: Diagnosis not present

## 2023-02-02 DIAGNOSIS — E039 Hypothyroidism, unspecified: Secondary | ICD-10-CM | POA: Diagnosis not present

## 2023-02-02 DIAGNOSIS — E119 Type 2 diabetes mellitus without complications: Secondary | ICD-10-CM | POA: Diagnosis not present

## 2023-04-17 DIAGNOSIS — M545 Low back pain, unspecified: Secondary | ICD-10-CM | POA: Diagnosis not present

## 2023-04-17 DIAGNOSIS — M791 Myalgia, unspecified site: Secondary | ICD-10-CM | POA: Diagnosis not present

## 2023-05-22 DIAGNOSIS — I10 Essential (primary) hypertension: Secondary | ICD-10-CM | POA: Diagnosis not present

## 2023-05-22 DIAGNOSIS — M109 Gout, unspecified: Secondary | ICD-10-CM | POA: Diagnosis not present

## 2023-05-22 DIAGNOSIS — N401 Enlarged prostate with lower urinary tract symptoms: Secondary | ICD-10-CM | POA: Diagnosis not present

## 2023-05-22 DIAGNOSIS — E785 Hyperlipidemia, unspecified: Secondary | ICD-10-CM | POA: Diagnosis not present

## 2023-05-22 DIAGNOSIS — E119 Type 2 diabetes mellitus without complications: Secondary | ICD-10-CM | POA: Diagnosis not present

## 2023-05-22 DIAGNOSIS — E039 Hypothyroidism, unspecified: Secondary | ICD-10-CM | POA: Diagnosis not present

## 2023-05-29 DIAGNOSIS — R2681 Unsteadiness on feet: Secondary | ICD-10-CM | POA: Diagnosis not present

## 2023-05-29 DIAGNOSIS — Z0000 Encounter for general adult medical examination without abnormal findings: Secondary | ICD-10-CM | POA: Diagnosis not present

## 2023-05-29 DIAGNOSIS — Z1331 Encounter for screening for depression: Secondary | ICD-10-CM | POA: Diagnosis not present

## 2023-05-29 DIAGNOSIS — I10 Essential (primary) hypertension: Secondary | ICD-10-CM | POA: Diagnosis not present

## 2023-05-29 DIAGNOSIS — M48061 Spinal stenosis, lumbar region without neurogenic claudication: Secondary | ICD-10-CM | POA: Diagnosis not present

## 2023-05-29 DIAGNOSIS — Z1339 Encounter for screening examination for other mental health and behavioral disorders: Secondary | ICD-10-CM | POA: Diagnosis not present

## 2023-05-29 DIAGNOSIS — R82998 Other abnormal findings in urine: Secondary | ICD-10-CM | POA: Diagnosis not present

## 2023-05-29 DIAGNOSIS — K5790 Diverticulosis of intestine, part unspecified, without perforation or abscess without bleeding: Secondary | ICD-10-CM | POA: Diagnosis not present

## 2023-05-29 DIAGNOSIS — N401 Enlarged prostate with lower urinary tract symptoms: Secondary | ICD-10-CM | POA: Diagnosis not present

## 2023-05-29 DIAGNOSIS — E785 Hyperlipidemia, unspecified: Secondary | ICD-10-CM | POA: Diagnosis not present

## 2023-05-29 DIAGNOSIS — E039 Hypothyroidism, unspecified: Secondary | ICD-10-CM | POA: Diagnosis not present

## 2023-05-29 DIAGNOSIS — E119 Type 2 diabetes mellitus without complications: Secondary | ICD-10-CM | POA: Diagnosis not present

## 2023-05-29 DIAGNOSIS — M109 Gout, unspecified: Secondary | ICD-10-CM | POA: Diagnosis not present

## 2023-06-02 ENCOUNTER — Encounter: Payer: Self-pay | Admitting: Neurology

## 2023-07-27 NOTE — Progress Notes (Signed)
 Initial neurology clinic note  Reason for Evaluation: Consultation requested by Avva, Ravisankar, MD for an opinion regarding unsteady gait. My final recommendations will be communicated back to the requesting physician by way of shared medical record or letter to requesting physician via [place]  mail.  HPI: This is [name], a 83 y.o. 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 The patient is accompanied by wife.  Patient mentions he became inactive during COVID (2020). He has a history of lumbar radiculopathy with foot drop and a brace at least 10 years ago. He has worn an AFO since back surgery (with most recent one about 83 years old). About 1 year ago, patient started slowing down. If he tried to walk fast, he would stumble. Prior to that he walked fast. He started to have low back pain and hip pain as well. He tried a new bed, but that did not help. Heating pain seems to help the most. He denies any numbness or tingling in his legs.  He was seen at [facility] (Dr. Assunta Lax). He was given prednisone  for 10 days. Per patient, Dr. Assunta Lax thinks it could be due to his leg or back. He also got an injection. This helped for 6 weeks and patient states he was walking well. He did not want to go back because he didn't want more steroids.  He did trip about 1 month ago and would have fallen had he not caught himself with a table. He thinks he sprained his ankle. It feels fine now.   Patient has not done any recent therapy. He has to take care of his wife, so he has limited time to do so.  Patient was sent to neurology to look for problems with equilibrium.   He denies freezing. He denies rest tremors. He denies problems with smell. There is no concerns for REM sleep disorder. He feels well rested when he wakes.  He does not  report any constitutional symptoms like fever, night sweats, anorexia or unintentional weight loss.  EtOH use: none  Restrictive diet? no Family history of neurologic disease? Sister having trouble walking (85)  Of note, wife has noticed patient likes it lean on his left elbow. He does endorse some tingling/numbness in his 5th digit of the left hand.   MEDICATIONS:  Outpatient Encounter Medications as of 08/05/2023  Medication Sig   acetaminophen  (TYLENOL ) 500 MG tablet Take 1,000-1,500 mg by mouth every 6 (six) hours as needed for mild pain, moderate pain or fever.    allopurinol  (ZYLOPRIM ) 300 MG tablet Take 300 mg by mouth every morning.    amLODipine (NORVASC) 5 MG tablet    calcium -vitamin D (OSCAL WITH D) 500-5 MG-MCG tablet Take 1 tablet by mouth.   levothyroxine  (SYNTHROID , LEVOTHROID) 50 MCG tablet Take 50 mcg by mouth daily before breakfast.   simvastatin  (ZOCOR ) 40 MG tablet Take 40 mg by mouth every morning.   apixaban  (ELIQUIS ) 5 MG TABS tablet Take two tabs in the morning and two tabs at night through 2/19, then take 1 tab morning and night thereafter for three months. (Patient not taking: Reported on 08/05/2023)   BENICAR  HCT 40-25 MG tablet Take 1 tablet by mouth every morning.  (Patient not taking: Reported on 08/05/2023)   bismuth  subsalicylate (PEPTO BISMOL) 262 MG chewable tablet Chew 1 tablet (262 mg total)  by mouth 4 (four) times daily -  before meals and at bedtime. (Patient not taking: Reported on 08/05/2023)   fish oil-omega-3 fatty acids  1000 MG capsule Take 2 g by mouth every morning.  (Patient not taking: Reported on 08/05/2023)   gabapentin  (NEURONTIN ) 300 MG capsule Take 300 mg by mouth 3 (three) times daily. (Patient not taking: Reported on 08/05/2023)   tamsulosin  (FLOMAX ) 0.4 MG CAPS capsule Take 0.4 mg by mouth every morning.  (Patient not taking: Reported on 08/05/2023)   No facility-administered encounter medications on file as of 08/05/2023.    PAST MEDICAL  HISTORY: Past Medical History:  Diagnosis Date   Arthritis    spine and hands   Borderline diabetes    Colon polyps    Tubular Adenoma    GERD (gastroesophageal reflux disease)    Gout    Helicobacter pylori gastritis    Hyperlipidemia    Hypertension    Hyperthyroidism     PAST SURGICAL HISTORY: Past Surgical History:  Procedure Laterality Date   BACK SURGERY  2010   BICEPS TENDON REPAIR Right 1999   KNEE ARTHROSCOPY Left 03/25/2016   Procedure: ARTHROSCOPIC WASHOUT OF LEFT KNEE;  Surgeon: Hazle Lites, MD;  Location: [facility];  Service: Orthopedics;  Laterality: Left;   KNEE ARTHROSCOPY Left 03/30/2016   Procedure: ARTHROSCOPY KNEE;  Surgeon: Orvan Blanch, MD;  Location: [facility];  Service: Orthopedics;  Laterality: Left;   MOLE REMOVAL     face   VASECTOMY  1978    ALLERGIES: Allergies  Allergen Reactions   Oxycontin  [Oxycodone  Hcl] Nausea And Vomiting and Other (See Comments)    Dizziness    Valsartan-Hydrochlorothiazide  Diarrhea    FAMILY HISTORY: Family History  Problem Relation Age of Onset   Diabetes Mother    Colon cancer Brother        2's   Diabetes Brother     SOCIAL HISTORY: Social History   Tobacco Use   Smoking status: Never   Smokeless tobacco: Never  Substance Use Topics   Alcohol  use: No    Alcohol /week: 0.0 standard drinks of alcohol    Drug use: No   Social History   Social History Narrative   Are you right handed or left handed? Right Handed    Are you currently employed ?    What is your current occupation?   Do you live at home alone? No    Who lives with you? Wife    What type of home do you live in: 1 story or 2 story? Lives in a one story home         OBJECTIVE: PHYSICAL EXAM: BP 116/60   Pulse 78   Ht 6' (1.829 m)   Wt 177 lb (80.3 kg)   SpO2 97%   BMI 24.01 kg/m   General: General appearance: Awake and alert. No distress. Cooperative with exam.  Skin: No obvious rash or jaundice. HEENT: Atraumatic.  Anicteric. Lungs: Non-labored breathing on room air  Heart: Regular Extremities: Arthritic changes in hands. Psych: Affect appropriate.  Neurological: Mental Status: Alert. Speech fluent. No pseudobulbar affect Cranial Nerves: CNII: No RAPD. Visual fields grossly intact. CNIII, IV, VI: PERRL. No nystagmus. EOMI. CN V: Facial sensation intact bilaterally to fine touch. CN VII: Facial muscles symmetric and strong. No ptosis at rest. CN VIII: Hearing grossly intact bilaterally. CN IX: No hypophonia. CN X: Palate elevates symmetrically. CN XI: Full strength shoulder shrug bilaterally. CN XII: Tongue protrusion full and midline. No atrophy or  fasciculations. No significant dysarthria Motor: Tone is normal. No tremors  Individual muscle group testing (MRC grade out of 5):  Movement     Neck flexion 5    Neck extension 5     Right Left   Shoulder abduction 5 5   Elbow flexion 5 5   Elbow extension 5 5   Finger abduction - FDI 5 4+   Finger abduction - ADM 5 4+   Finger extension 5 5   Finger distal flexion - 2/3 5 5    Finger distal flexion - 4/5 5 4+   Thumb flexion - FPL 5 5   Thumb abduction - APB 5 5    Hip flexion 5 5   Hip extension 5 5   Hip adduction 5 5   Hip abduction 5 5   Knee extension 5 5   Knee flexion 5 5   Dorsiflexion 0 4+   Plantarflexion 4- 5-   Inversion 0 5-   Eversion 0 5-     Reflexes:  Right Left   Bicep 2+ 2+   Tricep 2+ 2+   BrRad 2+ 2+   Knee 2+ 2+   Ankle 0 0    Pathological Reflexes: Babinski: flexor response bilaterally Hoffman: absent bilaterally Troemner: absent bilaterally Sensation: Pinprick: Diminished in right foot, otherwise intact Vibration: Diminished in lower extremities (great toes, ankles, and patella). Intact in upper extremities Proprioception: Absent in right great toe, present in left great toe Coordination: Intact finger-to- nose-finger bilaterally. Romberg negative. Finger tapping and toe tapping without  decrement. Gait: Able to rise from chair with arms crossed unassisted though with some rocking. Stooped posture. Normal arm swing. Steppage gait with AFO on right, somewhat unsteady. No freezing. Normal turns.  Lab and Test Review: External labs: 05/22/23: CMP significant for glucose 122 CBC significant for WBC 11.60 Uric acid wnl TSH wnl HbA1c: 6.2  Imaging/Procedures: MRI lumbar spine w/wo contrast (03/24/2016): IMPRESSION: 1. Negative for lumbosacral discitis or osteomyelitis, and normal SI joints. 2. Acute myositis of the bilateral medial gluteal muscles which could be infectious or inflammatory. No associated intramuscular abscess. 3. 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 Moderate to severe spinal and left lateral recess stenosis at L2-L3.  ASSESSMENT: HOBIE KOHLES is a 83 y.o. male who presents for evaluation of gait instability/slowness. 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 His neurological examination is pertinent for ulnar distribution weakness in the left hand and bilateral lower extremity weakness with diminished sensation. Available diagnostic data is significant for MRI lumbar spine from 2018 showing moderate to severe spinal and left lateral recess stenosis at L2-3. There is also mention of acute myositis of medial gluteal muscles. The symptoms in patient's leg affecting his walking are likely the residuals of lumbar spine disease. While deficits are longstanding, he has increased pain that may be contributing to recent (last year) worsening. This was helped with steroids and lumbar injection. 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 Patient was not previously aware of this deficit.  PLAN: -Blood work: B1, B12, CK -PT (at [facility] in [place]) -Encourage patient to follow up  with Dr. Assunta Lax  -Discussed not leaning on elbow to prevent worsening of left ulnar neuropathy  -Return to clinic in 6 months  The impression above as well as the plan as outlined below were extensively  discussed with the patient (in the company of wife) who voiced understanding. All questions were answered to their satisfaction.  The patient was counseled on pertinent fall precautions per the printed material provided today, and as noted under the Patient Instructions section below.  When available, results of the above investigations and possible further recommendations will be communicated to the patient via telephone/MyChart. Patient to call office if not contacted after expected testing turnaround time.   Total time spent reviewing records, interview, history/exam, documentation, and coordination of care on day of encounter:  65 min   Thank you for allowing me to participate in patient's care.  If I can answer any additional questions, I would be pleased to do so.  Rommie Coats, MD   CC: Lonzie Robins, MD [address] [place] [address]  CC: Referring provider: Avva, Ravisankar, MD [address] [place],  [place] [address]

## 2023-08-05 ENCOUNTER — Other Ambulatory Visit

## 2023-08-05 ENCOUNTER — Ambulatory Visit (INDEPENDENT_AMBULATORY_CARE_PROVIDER_SITE_OTHER): Admitting: Neurology

## 2023-08-05 ENCOUNTER — Encounter: Payer: Self-pay | Admitting: Neurology

## 2023-08-05 VITALS — BP 116/60 | HR 78 | Ht 72.0 in | Wt 177.0 lb

## 2023-08-05 DIAGNOSIS — M21371 Foot drop, right foot: Secondary | ICD-10-CM

## 2023-08-05 DIAGNOSIS — M5416 Radiculopathy, lumbar region: Secondary | ICD-10-CM | POA: Diagnosis not present

## 2023-08-05 DIAGNOSIS — M21372 Foot drop, left foot: Secondary | ICD-10-CM | POA: Diagnosis not present

## 2023-08-05 DIAGNOSIS — R2689 Other abnormalities of gait and mobility: Secondary | ICD-10-CM | POA: Diagnosis not present

## 2023-08-05 NOTE — Patient Instructions (Addendum)
 I saw you today for trouble walking. I think your symptoms are the result of your back.  I will get labs today to look for any other contributing cause.  I am referring you to physical therapy. We will try [facility] in [place] as you requested.  I think it would be a good idea to follow up with Dr. Assunta Lax as well.  I will see you back in clinic in about 6 months to check your progress.  Please let me know if you have any questions or concerns in the meantime.  The physicians and staff at [facility] are committed to providing excellent care. You may receive a survey requesting feedback about your experience at our office. We strive to receive very good responses to the survey questions. If you feel that your experience would prevent you from giving the office a very good  response, please contact our office to try to remedy the situation. We may be reached at [phone]. Thank you for taking the time out of your busy day to complete the survey.  Zachary Coats, MD [facility]  Preventing Falls at [facility] are common, often dreaded events in the lives of older people. Aside from the obvious injuries and even death that may result, fall can cause wide-ranging consequences including loss of independence, mental decline, decreased activity and mobility. Younger people are also at risk of falling, especially those with chronic illnesses and fatigue.  Ways to reduce risk for falling Examine diet and medications. Warm foods and alcohol  dilate blood vessels, which can lead to dizziness when standing. Sleep aids, antidepressants and pain medications can also increase the likelihood of a fall.  Get a vision exam. Poor vision, cataracts and glaucoma increase the chances of falling.  Check foot gear. Shoes should fit snugly and have a sturdy, nonskid sole and a broad, low heel  Participate in a physician-approved exercise program to build and maintain muscle strength  and improve balance and coordination. Programs that use ankle weights or stretch bands are excellent for muscle-strengthening. Water  aerobics programs and low-impact Tai Chi programs have also been shown to improve balance and coordination.  Increase vitamin D intake. Vitamin D improves muscle strength and increases the amount of calcium  the body is able to absorb and deposit in bones.  How to prevent falls from common hazards Floors - Remove all loose wires, cords, and throw rugs. Minimize clutter. Make sure rugs are anchored and smooth. Keep furniture in its usual place.  Chairs -- Use chairs with straight backs, armrests and firm seats. Add firm cushions to existing pieces to add height.  Bathroom - Install grab bars and non-skid tape in the tub or shower. Use a bathtub transfer bench or a shower chair with a back support Use an elevated toilet seat and/or safety rails to assist standing from a low surface. Do not use towel racks or bathroom tissue holders to help you stand.  Lighting - Make sure halls, stairways, and entrances are well-lit. Install a night light in your bathroom or hallway. Make sure there is a light switch at the top and bottom of the staircase. Turn lights on if you get up in the middle of the night. Make sure lamps or light switches are within reach of the bed if you have to get up during the night.  Kitchen - Install non-skid rubber mats near the sink and stove. Clean spills immediately. Store frequently used utensils, pots, pans between waist and eye level. This  helps prevent reaching and bending. Sit when getting things out of lower cupboards.  Living room/ Bedrooms - Place furniture with wide spaces in between, giving enough room to move around. Establish a route through the living room that gives you something to hold onto as you walk.  Stairs - Make sure treads, rails, and rugs are secure. Install a rail on both sides of the stairs. If stairs are a threat, it might be  helpful to arrange most of your activities on the lower level to reduce the number of times you must climb the stairs.  Entrances and doorways - Install metal handles on the walls adjacent to the doorknobs of all doors to make it more secure as you travel through the doorway.  Tips for maintaining balance Keep at least one hand free at all times. Try using a backpack or fanny pack to hold things rather than carrying them in your hands. Never carry objects in both hands when walking as this interferes with keeping your balance.  Attempt to swing both arms from front to back while walking. This might require a conscious effort if Parkinson's disease has diminished your movement. It will, however, help you to maintain balance and posture, and reduce fatigue.  Consciously lift your feet off of the ground when walking. Shuffling and dragging of the feet is a common culprit in losing your balance.  When trying to navigate turns, use a U technique of facing forward and making a wide turn, rather than pivoting sharply.  Try to stand with your feet shoulder-length apart. When your feet are close together for any length of time, you increase your risk of losing your balance and falling.  Do one thing at a time. Don't try to walk and accomplish another task, such as reading or looking around. The decrease in your automatic reflexes complicates motor function, so the less distraction, the better.  Do not wear rubber or gripping soled shoes, they might catch on the floor and cause tripping.  Move slowly when changing positions. Use deliberate, concentrated movements and, if needed, use a grab bar or walking aid. Count 15 seconds between each movement. For example, when rising from a seated position, wait 15 seconds after standing to begin walking.  If balance is a continuous problem, you might want to consider a walking aid such as a cane, walking stick, or walker. Once you've mastered walking with help,  you might be ready to try it on your own again.

## 2023-08-06 ENCOUNTER — Encounter: Payer: Self-pay | Admitting: Neurology

## 2023-08-06 LAB — CK: Total CK: 180 U/L (ref 17–247)

## 2023-08-08 LAB — VITAMIN B12: Vitamin B-12: 1911 pg/mL — ABNORMAL HIGH (ref 200–1100)

## 2023-08-08 LAB — VITAMIN B1: Vitamin B1 (Thiamine): 13 nmol/L (ref 8–30)

## 2023-08-10 ENCOUNTER — Ambulatory Visit: Payer: Self-pay | Admitting: Neurology

## 2023-08-11 DIAGNOSIS — M6281 Muscle weakness (generalized): Secondary | ICD-10-CM | POA: Diagnosis not present

## 2023-08-11 DIAGNOSIS — R2681 Unsteadiness on feet: Secondary | ICD-10-CM | POA: Diagnosis not present

## 2023-08-11 DIAGNOSIS — R262 Difficulty in walking, not elsewhere classified: Secondary | ICD-10-CM | POA: Diagnosis not present

## 2023-08-14 DIAGNOSIS — M6281 Muscle weakness (generalized): Secondary | ICD-10-CM | POA: Diagnosis not present

## 2023-08-14 DIAGNOSIS — R262 Difficulty in walking, not elsewhere classified: Secondary | ICD-10-CM | POA: Diagnosis not present

## 2023-08-14 DIAGNOSIS — R2681 Unsteadiness on feet: Secondary | ICD-10-CM | POA: Diagnosis not present

## 2023-08-17 DIAGNOSIS — R262 Difficulty in walking, not elsewhere classified: Secondary | ICD-10-CM | POA: Diagnosis not present

## 2023-08-17 DIAGNOSIS — M6281 Muscle weakness (generalized): Secondary | ICD-10-CM | POA: Diagnosis not present

## 2023-08-17 DIAGNOSIS — R2681 Unsteadiness on feet: Secondary | ICD-10-CM | POA: Diagnosis not present

## 2023-08-18 DIAGNOSIS — R262 Difficulty in walking, not elsewhere classified: Secondary | ICD-10-CM | POA: Diagnosis not present

## 2023-08-18 DIAGNOSIS — M6281 Muscle weakness (generalized): Secondary | ICD-10-CM | POA: Diagnosis not present

## 2023-08-18 DIAGNOSIS — R2681 Unsteadiness on feet: Secondary | ICD-10-CM | POA: Diagnosis not present

## 2023-08-21 DIAGNOSIS — R262 Difficulty in walking, not elsewhere classified: Secondary | ICD-10-CM | POA: Diagnosis not present

## 2023-08-21 DIAGNOSIS — R2681 Unsteadiness on feet: Secondary | ICD-10-CM | POA: Diagnosis not present

## 2023-08-21 DIAGNOSIS — M6281 Muscle weakness (generalized): Secondary | ICD-10-CM | POA: Diagnosis not present

## 2023-08-24 DIAGNOSIS — R262 Difficulty in walking, not elsewhere classified: Secondary | ICD-10-CM | POA: Diagnosis not present

## 2023-08-24 DIAGNOSIS — R2681 Unsteadiness on feet: Secondary | ICD-10-CM | POA: Diagnosis not present

## 2023-08-24 DIAGNOSIS — M6281 Muscle weakness (generalized): Secondary | ICD-10-CM | POA: Diagnosis not present

## 2023-08-25 DIAGNOSIS — R2681 Unsteadiness on feet: Secondary | ICD-10-CM | POA: Diagnosis not present

## 2023-08-25 DIAGNOSIS — R262 Difficulty in walking, not elsewhere classified: Secondary | ICD-10-CM | POA: Diagnosis not present

## 2023-08-25 DIAGNOSIS — M6281 Muscle weakness (generalized): Secondary | ICD-10-CM | POA: Diagnosis not present

## 2023-08-28 DIAGNOSIS — R2681 Unsteadiness on feet: Secondary | ICD-10-CM | POA: Diagnosis not present

## 2023-08-28 DIAGNOSIS — M6281 Muscle weakness (generalized): Secondary | ICD-10-CM | POA: Diagnosis not present

## 2023-08-28 DIAGNOSIS — R262 Difficulty in walking, not elsewhere classified: Secondary | ICD-10-CM | POA: Diagnosis not present

## 2023-08-31 DIAGNOSIS — R2681 Unsteadiness on feet: Secondary | ICD-10-CM | POA: Diagnosis not present

## 2023-08-31 DIAGNOSIS — M6281 Muscle weakness (generalized): Secondary | ICD-10-CM | POA: Diagnosis not present

## 2023-08-31 DIAGNOSIS — R262 Difficulty in walking, not elsewhere classified: Secondary | ICD-10-CM | POA: Diagnosis not present

## 2023-09-01 DIAGNOSIS — R2681 Unsteadiness on feet: Secondary | ICD-10-CM | POA: Diagnosis not present

## 2023-09-01 DIAGNOSIS — M6281 Muscle weakness (generalized): Secondary | ICD-10-CM | POA: Diagnosis not present

## 2023-09-01 DIAGNOSIS — R262 Difficulty in walking, not elsewhere classified: Secondary | ICD-10-CM | POA: Diagnosis not present

## 2023-09-04 DIAGNOSIS — R262 Difficulty in walking, not elsewhere classified: Secondary | ICD-10-CM | POA: Diagnosis not present

## 2023-09-04 DIAGNOSIS — M6281 Muscle weakness (generalized): Secondary | ICD-10-CM | POA: Diagnosis not present

## 2023-09-04 DIAGNOSIS — R2681 Unsteadiness on feet: Secondary | ICD-10-CM | POA: Diagnosis not present

## 2023-09-08 DIAGNOSIS — R262 Difficulty in walking, not elsewhere classified: Secondary | ICD-10-CM | POA: Diagnosis not present

## 2023-09-08 DIAGNOSIS — M6281 Muscle weakness (generalized): Secondary | ICD-10-CM | POA: Diagnosis not present

## 2023-09-08 DIAGNOSIS — R2681 Unsteadiness on feet: Secondary | ICD-10-CM | POA: Diagnosis not present

## 2023-09-10 DIAGNOSIS — R262 Difficulty in walking, not elsewhere classified: Secondary | ICD-10-CM | POA: Diagnosis not present

## 2023-09-10 DIAGNOSIS — M6281 Muscle weakness (generalized): Secondary | ICD-10-CM | POA: Diagnosis not present

## 2023-09-10 DIAGNOSIS — R2681 Unsteadiness on feet: Secondary | ICD-10-CM | POA: Diagnosis not present

## 2023-09-11 DIAGNOSIS — K5900 Constipation, unspecified: Secondary | ICD-10-CM | POA: Diagnosis not present

## 2023-09-11 DIAGNOSIS — K5790 Diverticulosis of intestine, part unspecified, without perforation or abscess without bleeding: Secondary | ICD-10-CM | POA: Diagnosis not present

## 2023-09-11 DIAGNOSIS — E039 Hypothyroidism, unspecified: Secondary | ICD-10-CM | POA: Diagnosis not present

## 2023-09-11 DIAGNOSIS — M109 Gout, unspecified: Secondary | ICD-10-CM | POA: Diagnosis not present

## 2023-09-11 DIAGNOSIS — M48061 Spinal stenosis, lumbar region without neurogenic claudication: Secondary | ICD-10-CM | POA: Diagnosis not present

## 2023-09-11 DIAGNOSIS — R2681 Unsteadiness on feet: Secondary | ICD-10-CM | POA: Diagnosis not present

## 2023-09-11 DIAGNOSIS — E119 Type 2 diabetes mellitus without complications: Secondary | ICD-10-CM | POA: Diagnosis not present

## 2023-09-11 DIAGNOSIS — E785 Hyperlipidemia, unspecified: Secondary | ICD-10-CM | POA: Diagnosis not present

## 2023-09-11 DIAGNOSIS — I10 Essential (primary) hypertension: Secondary | ICD-10-CM | POA: Diagnosis not present

## 2023-09-11 DIAGNOSIS — N401 Enlarged prostate with lower urinary tract symptoms: Secondary | ICD-10-CM | POA: Diagnosis not present

## 2023-09-14 DIAGNOSIS — M6281 Muscle weakness (generalized): Secondary | ICD-10-CM | POA: Diagnosis not present

## 2023-09-14 DIAGNOSIS — R262 Difficulty in walking, not elsewhere classified: Secondary | ICD-10-CM | POA: Diagnosis not present

## 2023-09-14 DIAGNOSIS — R2681 Unsteadiness on feet: Secondary | ICD-10-CM | POA: Diagnosis not present

## 2023-09-15 DIAGNOSIS — M6281 Muscle weakness (generalized): Secondary | ICD-10-CM | POA: Diagnosis not present

## 2023-09-15 DIAGNOSIS — R262 Difficulty in walking, not elsewhere classified: Secondary | ICD-10-CM | POA: Diagnosis not present

## 2023-09-15 DIAGNOSIS — R2681 Unsteadiness on feet: Secondary | ICD-10-CM | POA: Diagnosis not present

## 2023-09-18 DIAGNOSIS — R262 Difficulty in walking, not elsewhere classified: Secondary | ICD-10-CM | POA: Diagnosis not present

## 2023-09-18 DIAGNOSIS — M6281 Muscle weakness (generalized): Secondary | ICD-10-CM | POA: Diagnosis not present

## 2023-09-18 DIAGNOSIS — R2681 Unsteadiness on feet: Secondary | ICD-10-CM | POA: Diagnosis not present

## 2023-09-21 DIAGNOSIS — R262 Difficulty in walking, not elsewhere classified: Secondary | ICD-10-CM | POA: Diagnosis not present

## 2023-09-21 DIAGNOSIS — M6281 Muscle weakness (generalized): Secondary | ICD-10-CM | POA: Diagnosis not present

## 2023-09-21 DIAGNOSIS — R2681 Unsteadiness on feet: Secondary | ICD-10-CM | POA: Diagnosis not present

## 2023-09-25 DIAGNOSIS — R262 Difficulty in walking, not elsewhere classified: Secondary | ICD-10-CM | POA: Diagnosis not present

## 2023-09-25 DIAGNOSIS — R2681 Unsteadiness on feet: Secondary | ICD-10-CM | POA: Diagnosis not present

## 2023-09-25 DIAGNOSIS — M6281 Muscle weakness (generalized): Secondary | ICD-10-CM | POA: Diagnosis not present

## 2023-09-28 DIAGNOSIS — R2681 Unsteadiness on feet: Secondary | ICD-10-CM | POA: Diagnosis not present

## 2023-09-28 DIAGNOSIS — R262 Difficulty in walking, not elsewhere classified: Secondary | ICD-10-CM | POA: Diagnosis not present

## 2023-09-28 DIAGNOSIS — M6281 Muscle weakness (generalized): Secondary | ICD-10-CM | POA: Diagnosis not present

## 2023-09-29 DIAGNOSIS — R2681 Unsteadiness on feet: Secondary | ICD-10-CM | POA: Diagnosis not present

## 2023-09-29 DIAGNOSIS — M6281 Muscle weakness (generalized): Secondary | ICD-10-CM | POA: Diagnosis not present

## 2023-09-29 DIAGNOSIS — R262 Difficulty in walking, not elsewhere classified: Secondary | ICD-10-CM | POA: Diagnosis not present

## 2023-10-02 DIAGNOSIS — R262 Difficulty in walking, not elsewhere classified: Secondary | ICD-10-CM | POA: Diagnosis not present

## 2023-10-02 DIAGNOSIS — R2681 Unsteadiness on feet: Secondary | ICD-10-CM | POA: Diagnosis not present

## 2023-10-02 DIAGNOSIS — M6281 Muscle weakness (generalized): Secondary | ICD-10-CM | POA: Diagnosis not present

## 2023-10-06 DIAGNOSIS — R2681 Unsteadiness on feet: Secondary | ICD-10-CM | POA: Diagnosis not present

## 2023-10-06 DIAGNOSIS — R262 Difficulty in walking, not elsewhere classified: Secondary | ICD-10-CM | POA: Diagnosis not present

## 2023-10-06 DIAGNOSIS — M6281 Muscle weakness (generalized): Secondary | ICD-10-CM | POA: Diagnosis not present

## 2023-10-09 DIAGNOSIS — R2681 Unsteadiness on feet: Secondary | ICD-10-CM | POA: Diagnosis not present

## 2023-10-09 DIAGNOSIS — M6281 Muscle weakness (generalized): Secondary | ICD-10-CM | POA: Diagnosis not present

## 2023-10-09 DIAGNOSIS — R262 Difficulty in walking, not elsewhere classified: Secondary | ICD-10-CM | POA: Diagnosis not present

## 2023-10-12 DIAGNOSIS — R262 Difficulty in walking, not elsewhere classified: Secondary | ICD-10-CM | POA: Diagnosis not present

## 2023-10-12 DIAGNOSIS — M6281 Muscle weakness (generalized): Secondary | ICD-10-CM | POA: Diagnosis not present

## 2023-10-12 DIAGNOSIS — R2681 Unsteadiness on feet: Secondary | ICD-10-CM | POA: Diagnosis not present

## 2023-10-13 DIAGNOSIS — R2681 Unsteadiness on feet: Secondary | ICD-10-CM | POA: Diagnosis not present

## 2023-10-13 DIAGNOSIS — M6281 Muscle weakness (generalized): Secondary | ICD-10-CM | POA: Diagnosis not present

## 2023-10-13 DIAGNOSIS — R262 Difficulty in walking, not elsewhere classified: Secondary | ICD-10-CM | POA: Diagnosis not present

## 2023-10-16 DIAGNOSIS — M6281 Muscle weakness (generalized): Secondary | ICD-10-CM | POA: Diagnosis not present

## 2023-10-16 DIAGNOSIS — R2681 Unsteadiness on feet: Secondary | ICD-10-CM | POA: Diagnosis not present

## 2023-10-16 DIAGNOSIS — M5459 Other low back pain: Secondary | ICD-10-CM | POA: Diagnosis not present

## 2023-10-16 DIAGNOSIS — R262 Difficulty in walking, not elsewhere classified: Secondary | ICD-10-CM | POA: Diagnosis not present

## 2023-10-16 DIAGNOSIS — M791 Myalgia, unspecified site: Secondary | ICD-10-CM | POA: Diagnosis not present

## 2023-10-19 DIAGNOSIS — R2681 Unsteadiness on feet: Secondary | ICD-10-CM | POA: Diagnosis not present

## 2023-10-19 DIAGNOSIS — M6281 Muscle weakness (generalized): Secondary | ICD-10-CM | POA: Diagnosis not present

## 2023-10-19 DIAGNOSIS — R262 Difficulty in walking, not elsewhere classified: Secondary | ICD-10-CM | POA: Diagnosis not present

## 2023-10-20 DIAGNOSIS — R2681 Unsteadiness on feet: Secondary | ICD-10-CM | POA: Diagnosis not present

## 2023-10-20 DIAGNOSIS — R262 Difficulty in walking, not elsewhere classified: Secondary | ICD-10-CM | POA: Diagnosis not present

## 2023-10-20 DIAGNOSIS — M6281 Muscle weakness (generalized): Secondary | ICD-10-CM | POA: Diagnosis not present

## 2023-10-23 DIAGNOSIS — R2681 Unsteadiness on feet: Secondary | ICD-10-CM | POA: Diagnosis not present

## 2023-10-23 DIAGNOSIS — R262 Difficulty in walking, not elsewhere classified: Secondary | ICD-10-CM | POA: Diagnosis not present

## 2023-10-23 DIAGNOSIS — M6281 Muscle weakness (generalized): Secondary | ICD-10-CM | POA: Diagnosis not present

## 2023-10-26 DIAGNOSIS — R262 Difficulty in walking, not elsewhere classified: Secondary | ICD-10-CM | POA: Diagnosis not present

## 2023-10-26 DIAGNOSIS — R2681 Unsteadiness on feet: Secondary | ICD-10-CM | POA: Diagnosis not present

## 2023-10-26 DIAGNOSIS — M6281 Muscle weakness (generalized): Secondary | ICD-10-CM | POA: Diagnosis not present

## 2023-10-27 DIAGNOSIS — R2681 Unsteadiness on feet: Secondary | ICD-10-CM | POA: Diagnosis not present

## 2023-10-27 DIAGNOSIS — M6281 Muscle weakness (generalized): Secondary | ICD-10-CM | POA: Diagnosis not present

## 2023-10-27 DIAGNOSIS — R262 Difficulty in walking, not elsewhere classified: Secondary | ICD-10-CM | POA: Diagnosis not present

## 2023-10-30 DIAGNOSIS — R2681 Unsteadiness on feet: Secondary | ICD-10-CM | POA: Diagnosis not present

## 2023-10-30 DIAGNOSIS — R262 Difficulty in walking, not elsewhere classified: Secondary | ICD-10-CM | POA: Diagnosis not present

## 2023-10-30 DIAGNOSIS — M6281 Muscle weakness (generalized): Secondary | ICD-10-CM | POA: Diagnosis not present

## 2023-11-02 DIAGNOSIS — R262 Difficulty in walking, not elsewhere classified: Secondary | ICD-10-CM | POA: Diagnosis not present

## 2023-11-02 DIAGNOSIS — R2681 Unsteadiness on feet: Secondary | ICD-10-CM | POA: Diagnosis not present

## 2023-11-02 DIAGNOSIS — M6281 Muscle weakness (generalized): Secondary | ICD-10-CM | POA: Diagnosis not present

## 2023-11-03 DIAGNOSIS — M6281 Muscle weakness (generalized): Secondary | ICD-10-CM | POA: Diagnosis not present

## 2023-11-03 DIAGNOSIS — R2681 Unsteadiness on feet: Secondary | ICD-10-CM | POA: Diagnosis not present

## 2023-11-03 DIAGNOSIS — R262 Difficulty in walking, not elsewhere classified: Secondary | ICD-10-CM | POA: Diagnosis not present

## 2023-11-09 DIAGNOSIS — R262 Difficulty in walking, not elsewhere classified: Secondary | ICD-10-CM | POA: Diagnosis not present

## 2023-11-09 DIAGNOSIS — M6281 Muscle weakness (generalized): Secondary | ICD-10-CM | POA: Diagnosis not present

## 2023-11-09 DIAGNOSIS — R2681 Unsteadiness on feet: Secondary | ICD-10-CM | POA: Diagnosis not present

## 2023-11-10 DIAGNOSIS — M6281 Muscle weakness (generalized): Secondary | ICD-10-CM | POA: Diagnosis not present

## 2023-11-10 DIAGNOSIS — R2681 Unsteadiness on feet: Secondary | ICD-10-CM | POA: Diagnosis not present

## 2023-11-10 DIAGNOSIS — R262 Difficulty in walking, not elsewhere classified: Secondary | ICD-10-CM | POA: Diagnosis not present

## 2023-11-13 DIAGNOSIS — M6281 Muscle weakness (generalized): Secondary | ICD-10-CM | POA: Diagnosis not present

## 2023-11-13 DIAGNOSIS — R262 Difficulty in walking, not elsewhere classified: Secondary | ICD-10-CM | POA: Diagnosis not present

## 2023-11-13 DIAGNOSIS — R2681 Unsteadiness on feet: Secondary | ICD-10-CM | POA: Diagnosis not present

## 2023-11-16 DIAGNOSIS — R262 Difficulty in walking, not elsewhere classified: Secondary | ICD-10-CM | POA: Diagnosis not present

## 2023-11-16 DIAGNOSIS — R2681 Unsteadiness on feet: Secondary | ICD-10-CM | POA: Diagnosis not present

## 2023-11-16 DIAGNOSIS — M6281 Muscle weakness (generalized): Secondary | ICD-10-CM | POA: Diagnosis not present

## 2023-11-17 DIAGNOSIS — R2681 Unsteadiness on feet: Secondary | ICD-10-CM | POA: Diagnosis not present

## 2023-11-17 DIAGNOSIS — M6281 Muscle weakness (generalized): Secondary | ICD-10-CM | POA: Diagnosis not present

## 2023-11-17 DIAGNOSIS — R262 Difficulty in walking, not elsewhere classified: Secondary | ICD-10-CM | POA: Diagnosis not present

## 2023-11-20 DIAGNOSIS — R2681 Unsteadiness on feet: Secondary | ICD-10-CM | POA: Diagnosis not present

## 2023-11-20 DIAGNOSIS — M6281 Muscle weakness (generalized): Secondary | ICD-10-CM | POA: Diagnosis not present

## 2023-11-20 DIAGNOSIS — R262 Difficulty in walking, not elsewhere classified: Secondary | ICD-10-CM | POA: Diagnosis not present

## 2023-11-23 DIAGNOSIS — R2681 Unsteadiness on feet: Secondary | ICD-10-CM | POA: Diagnosis not present

## 2023-11-23 DIAGNOSIS — M6281 Muscle weakness (generalized): Secondary | ICD-10-CM | POA: Diagnosis not present

## 2023-11-23 DIAGNOSIS — R262 Difficulty in walking, not elsewhere classified: Secondary | ICD-10-CM | POA: Diagnosis not present

## 2023-11-24 DIAGNOSIS — M6281 Muscle weakness (generalized): Secondary | ICD-10-CM | POA: Diagnosis not present

## 2023-11-24 DIAGNOSIS — R262 Difficulty in walking, not elsewhere classified: Secondary | ICD-10-CM | POA: Diagnosis not present

## 2023-11-24 DIAGNOSIS — R2681 Unsteadiness on feet: Secondary | ICD-10-CM | POA: Diagnosis not present

## 2023-11-27 DIAGNOSIS — R262 Difficulty in walking, not elsewhere classified: Secondary | ICD-10-CM | POA: Diagnosis not present

## 2023-11-27 DIAGNOSIS — M6281 Muscle weakness (generalized): Secondary | ICD-10-CM | POA: Diagnosis not present

## 2023-11-27 DIAGNOSIS — R2681 Unsteadiness on feet: Secondary | ICD-10-CM | POA: Diagnosis not present

## 2023-11-30 DIAGNOSIS — M6281 Muscle weakness (generalized): Secondary | ICD-10-CM | POA: Diagnosis not present

## 2023-11-30 DIAGNOSIS — R262 Difficulty in walking, not elsewhere classified: Secondary | ICD-10-CM | POA: Diagnosis not present

## 2023-11-30 DIAGNOSIS — R2681 Unsteadiness on feet: Secondary | ICD-10-CM | POA: Diagnosis not present

## 2023-12-03 DIAGNOSIS — Z23 Encounter for immunization: Secondary | ICD-10-CM | POA: Diagnosis not present

## 2023-12-04 DIAGNOSIS — R262 Difficulty in walking, not elsewhere classified: Secondary | ICD-10-CM | POA: Diagnosis not present

## 2023-12-04 DIAGNOSIS — R2681 Unsteadiness on feet: Secondary | ICD-10-CM | POA: Diagnosis not present

## 2023-12-04 DIAGNOSIS — M6281 Muscle weakness (generalized): Secondary | ICD-10-CM | POA: Diagnosis not present

## 2023-12-07 DIAGNOSIS — R2681 Unsteadiness on feet: Secondary | ICD-10-CM | POA: Diagnosis not present

## 2023-12-07 DIAGNOSIS — R262 Difficulty in walking, not elsewhere classified: Secondary | ICD-10-CM | POA: Diagnosis not present

## 2023-12-07 DIAGNOSIS — M6281 Muscle weakness (generalized): Secondary | ICD-10-CM | POA: Diagnosis not present

## 2023-12-11 DIAGNOSIS — R2681 Unsteadiness on feet: Secondary | ICD-10-CM | POA: Diagnosis not present

## 2023-12-11 DIAGNOSIS — M6281 Muscle weakness (generalized): Secondary | ICD-10-CM | POA: Diagnosis not present

## 2023-12-11 DIAGNOSIS — R262 Difficulty in walking, not elsewhere classified: Secondary | ICD-10-CM | POA: Diagnosis not present

## 2023-12-14 DIAGNOSIS — R2681 Unsteadiness on feet: Secondary | ICD-10-CM | POA: Diagnosis not present

## 2023-12-14 DIAGNOSIS — M6281 Muscle weakness (generalized): Secondary | ICD-10-CM | POA: Diagnosis not present

## 2023-12-14 DIAGNOSIS — R262 Difficulty in walking, not elsewhere classified: Secondary | ICD-10-CM | POA: Diagnosis not present

## 2023-12-18 DIAGNOSIS — R262 Difficulty in walking, not elsewhere classified: Secondary | ICD-10-CM | POA: Diagnosis not present

## 2023-12-18 DIAGNOSIS — M6281 Muscle weakness (generalized): Secondary | ICD-10-CM | POA: Diagnosis not present

## 2023-12-18 DIAGNOSIS — R2681 Unsteadiness on feet: Secondary | ICD-10-CM | POA: Diagnosis not present

## 2023-12-21 DIAGNOSIS — R2681 Unsteadiness on feet: Secondary | ICD-10-CM | POA: Diagnosis not present

## 2023-12-21 DIAGNOSIS — R262 Difficulty in walking, not elsewhere classified: Secondary | ICD-10-CM | POA: Diagnosis not present

## 2023-12-21 DIAGNOSIS — M6281 Muscle weakness (generalized): Secondary | ICD-10-CM | POA: Diagnosis not present

## 2023-12-28 DIAGNOSIS — R2681 Unsteadiness on feet: Secondary | ICD-10-CM | POA: Diagnosis not present

## 2023-12-28 DIAGNOSIS — I10 Essential (primary) hypertension: Secondary | ICD-10-CM | POA: Diagnosis not present

## 2023-12-28 DIAGNOSIS — E785 Hyperlipidemia, unspecified: Secondary | ICD-10-CM | POA: Diagnosis not present

## 2023-12-28 DIAGNOSIS — E119 Type 2 diabetes mellitus without complications: Secondary | ICD-10-CM | POA: Diagnosis not present

## 2023-12-28 DIAGNOSIS — M109 Gout, unspecified: Secondary | ICD-10-CM | POA: Diagnosis not present

## 2023-12-28 DIAGNOSIS — N401 Enlarged prostate with lower urinary tract symptoms: Secondary | ICD-10-CM | POA: Diagnosis not present

## 2023-12-28 DIAGNOSIS — M48061 Spinal stenosis, lumbar region without neurogenic claudication: Secondary | ICD-10-CM | POA: Diagnosis not present

## 2023-12-28 DIAGNOSIS — R634 Abnormal weight loss: Secondary | ICD-10-CM | POA: Diagnosis not present

## 2023-12-28 DIAGNOSIS — E039 Hypothyroidism, unspecified: Secondary | ICD-10-CM | POA: Diagnosis not present

## 2023-12-28 DIAGNOSIS — K5790 Diverticulosis of intestine, part unspecified, without perforation or abscess without bleeding: Secondary | ICD-10-CM | POA: Diagnosis not present

## 2023-12-30 DIAGNOSIS — L570 Actinic keratosis: Secondary | ICD-10-CM | POA: Diagnosis not present

## 2023-12-30 DIAGNOSIS — D225 Melanocytic nevi of trunk: Secondary | ICD-10-CM | POA: Diagnosis not present

## 2023-12-30 DIAGNOSIS — L821 Other seborrheic keratosis: Secondary | ICD-10-CM | POA: Diagnosis not present

## 2023-12-30 DIAGNOSIS — D485 Neoplasm of uncertain behavior of skin: Secondary | ICD-10-CM | POA: Diagnosis not present

## 2023-12-30 DIAGNOSIS — Z85828 Personal history of other malignant neoplasm of skin: Secondary | ICD-10-CM | POA: Diagnosis not present

## 2023-12-30 DIAGNOSIS — D1801 Hemangioma of skin and subcutaneous tissue: Secondary | ICD-10-CM | POA: Diagnosis not present

## 2023-12-30 DIAGNOSIS — C4442 Squamous cell carcinoma of skin of scalp and neck: Secondary | ICD-10-CM | POA: Diagnosis not present

## 2023-12-31 DIAGNOSIS — H524 Presbyopia: Secondary | ICD-10-CM | POA: Diagnosis not present

## 2023-12-31 DIAGNOSIS — H2513 Age-related nuclear cataract, bilateral: Secondary | ICD-10-CM | POA: Diagnosis not present

## 2023-12-31 DIAGNOSIS — H35371 Puckering of macula, right eye: Secondary | ICD-10-CM | POA: Diagnosis not present

## 2024-01-01 DIAGNOSIS — M6281 Muscle weakness (generalized): Secondary | ICD-10-CM | POA: Diagnosis not present

## 2024-01-01 DIAGNOSIS — R2681 Unsteadiness on feet: Secondary | ICD-10-CM | POA: Diagnosis not present

## 2024-01-01 DIAGNOSIS — R262 Difficulty in walking, not elsewhere classified: Secondary | ICD-10-CM | POA: Diagnosis not present

## 2024-01-04 DIAGNOSIS — R262 Difficulty in walking, not elsewhere classified: Secondary | ICD-10-CM | POA: Diagnosis not present

## 2024-01-04 DIAGNOSIS — R2681 Unsteadiness on feet: Secondary | ICD-10-CM | POA: Diagnosis not present

## 2024-01-04 DIAGNOSIS — M6281 Muscle weakness (generalized): Secondary | ICD-10-CM | POA: Diagnosis not present

## 2024-01-11 DIAGNOSIS — R2681 Unsteadiness on feet: Secondary | ICD-10-CM | POA: Diagnosis not present

## 2024-01-11 DIAGNOSIS — M6281 Muscle weakness (generalized): Secondary | ICD-10-CM | POA: Diagnosis not present

## 2024-01-11 DIAGNOSIS — R262 Difficulty in walking, not elsewhere classified: Secondary | ICD-10-CM | POA: Diagnosis not present

## 2024-02-03 NOTE — Progress Notes (Signed)
 "  I saw Zachary Mcdonald in neurology clinic on 02/17/24 in follow up for unsteady gait.  HPI: Zachary Mcdonald is a 83 y.o. 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  To briefly review: 08/05/23: Patient mentions he became inactive during COVID (2020). He has a history of lumbar radiculopathy with foot drop and a brace at least 10 years ago. He has worn an AFO since back surgery (with most recent one about 83 years old). About 1 year ago, patient started slowing down. If he tried to walk fast, he would stumble. Prior to that he walked fast. He started to have low back pain and hip pain as well. He tried a new bed, but that did not help. Heating pain seems to help the most. He denies any numbness or tingling in his legs.   He was seen at [facility] (Dr. Terrie). He was given prednisone  for 10 days. Per patient, Dr. Terrie thinks it could be due to his leg or back. He also got an injection. This helped for 6 weeks and patient states he was walking well. He did not want to go back because he didn't want more steroids.   He did trip about 1 month ago and would have fallen had he not caught himself with a table. He thinks he sprained his ankle. It feels fine now.    Patient has not done any recent therapy. He has to take care of his wife, so he has limited time to do so.   Patient was sent to neurology to look for problems with equilibrium.    He denies freezing. He denies rest tremors. He denies problems with smell. There is no concerns for REM sleep disorder. He feels well rested when he wakes.   He does not report any constitutional symptoms like fever, night sweats, anorexia or unintentional weight loss.   EtOH use: none  Restrictive diet? no Family history of neurologic disease? Sister having trouble walking (85)   Of note, wife has noticed patient likes it lean on his left elbow.  He does endorse some tingling/numbness in his 5th digit of the left hand.  Most recent Assessment and Plan (08/05/23): Zachary Mcdonald is a 83 y.o. male who presents for evaluation of gait instability/slowness. 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 His neurological examination is pertinent for ulnar distribution weakness in the left hand and bilateral lower extremity weakness with diminished sensation. Available diagnostic data is significant for MRI lumbar spine from 2018 showing moderate to severe spinal and left lateral recess stenosis at L2-3. There is also mention of acute myositis of medial gluteal muscles. The symptoms in patient's leg affecting his walking are likely the residuals of lumbar spine disease. While deficits are longstanding, he has increased pain that may be contributing to recent (last year) worsening. This was helped with steroids and lumbar injection. 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 Patient was not previously aware of this deficit.   PLAN: -Blood work: B1, B12, CK -PT (at [facility] in [place]) -Encourage patient to follow up with Dr. Terrie   -Discussed not leaning on elbow to prevent worsening of left ulnar neuropathy  Since their last visit: Labs were unremarkable.  Patient went to PT for  5 months. He does not think this did much. His back bothered him more. He continues to have significant leg pain (L > R). Patient saw Dr. Terrie and is getting another MRI lumbar spine on 02/26/24. He sees Dr. Terrie again on 03/04/24.   Patient denies any recent falls. He walks with a cane or walker.  He denies any change in his left arm/hand and no pain.   MEDICATIONS:  Outpatient Encounter Medications as of 02/17/2024  Medication Sig   acetaminophen  (TYLENOL ) 500 MG tablet Take 1,000-1,500 mg by mouth every 6 (six) hours as  needed for mild pain, moderate pain or fever.    allopurinol  (ZYLOPRIM ) 300 MG tablet Take 300 mg by mouth every morning.    amLODipine (NORVASC) 5 MG tablet  (Patient taking differently: Take 2.5 mg by mouth daily.)   Cholecalciferol 50 MCG (2000 UT) CAPS Take 1 capsule by mouth daily.   levothyroxine  (SYNTHROID , LEVOTHROID) 50 MCG tablet Take 50 mcg by mouth daily before breakfast.   simvastatin  (ZOCOR ) 40 MG tablet Take 40 mg by mouth every morning.   tamsulosin  (FLOMAX ) 0.4 MG CAPS capsule Take 0.4 mg by mouth every morning.    apixaban  (ELIQUIS ) 5 MG TABS tablet Take two tabs in the morning and two tabs at night through 2/19, then take 1 tab morning and night thereafter for three months. (Patient not taking: Reported on 02/17/2024)   BENICAR  HCT 40-25 MG tablet Take 1 tablet by mouth every morning.  (Patient not taking: Reported on 02/17/2024)   bismuth  subsalicylate (PEPTO BISMOL) 262 MG chewable tablet Chew 1 tablet (262 mg total) by mouth 4 (four) times daily -  before meals and at bedtime. (Patient not taking: Reported on 02/17/2024)   calcium -vitamin D (OSCAL WITH D) 500-5 MG-MCG tablet Take 1 tablet by mouth. (Patient not taking: Reported on 02/17/2024)   fish oil-omega-3 fatty acids  1000 MG capsule Take 2 g by mouth every morning.  (Patient not taking: Reported on 02/17/2024)   gabapentin  (NEURONTIN ) 300 MG capsule Take 300 mg by mouth 3 (three) times daily. (Patient not taking: Reported on 02/17/2024)   No facility-administered encounter medications on file as of 02/17/2024.    PAST MEDICAL HISTORY: Past Medical History:  Diagnosis Date   Arthritis    spine and hands   Borderline diabetes    Colon polyps    Tubular Adenoma    GERD (gastroesophageal reflux disease)    Gout    Helicobacter pylori gastritis    Hyperlipidemia    Hypertension    Hyperthyroidism     PAST SURGICAL HISTORY: Past Surgical History:  Procedure Laterality Date   BACK SURGERY  2010   BICEPS  TENDON REPAIR Right 1999   KNEE ARTHROSCOPY Left 03/25/2016   Procedure: ARTHROSCOPIC WASHOUT OF LEFT KNEE;  Surgeon: Tanda Heading, MD;  Location: [facility];  Service: Orthopedics;  Laterality: Left;   KNEE ARTHROSCOPY Left 03/30/2016   Procedure: ARTHROSCOPY KNEE;  Surgeon: Reyes Billing, MD;  Location: [facility];  Service: Orthopedics;  Laterality: Left;   MOLE REMOVAL     face   VASECTOMY  1978    ALLERGIES: Allergies[1]  FAMILY HISTORY: Family History  Problem Relation Age of Onset   Diabetes Mother    Colon cancer Brother        36's   Diabetes Brother     SOCIAL HISTORY: Social History[2] Social History   Social History Narrative   Are you right handed or left handed? Right Handed  Are you currently employed ?    What is your current occupation?   Do you live at home alone? No    Who lives with you? Wife    What type of home do you live in: 1 story or 2 story? Lives in a one story home    Caffeine occ    Objective:  Vital Signs:  BP (!) 151/65   Pulse 62   Ht 6' 1 (1.854 m)   Wt 169 lb (76.7 kg)   SpO2 96%   BMI 22.30 kg/m   General: General appearance: Awake and alert. No distress. Cooperative with exam.  Skin: No obvious rash or jaundice. HEENT: Atraumatic. Anicteric. Lungs: Non-labored breathing on room air   Neurological: Mental Status: Alert. Speech fluent. No pseudobulbar affect Cranial Nerves: CNII: No RAPD. Visual fields intact. CNIII, IV, VI: PERRL. No nystagmus. EOMI. CN V: Facial sensation intact bilaterally to fine touch. CN VII: Facial muscles symmetric and strong. No ptosis at rest. CN VIII: Hears finger rub well bilaterally. CN IX: No hypophonia. CN X: Palate elevates symmetrically. CN XI: Full strength shoulder shrug bilaterally. CN XII: Tongue protrusion full and midline. No atrophy or fasciculations. No significant dysarthria Motor: Tone is normal.  Individual muscle group testing (MRC grade out of 5):  Movement     Neck  flexion 5    Neck extension 5     Right Left   Shoulder abduction 5 5   Elbow flexion 5 5   Elbow extension 5 5   Finger abduction - FDI 5 4+   Finger abduction - ADM 5 4+   Finger extension 5 5   Finger distal flexion - 2/3 5 5    Finger distal flexion - 4/5 5 4+   Thumb flexion - FPL 5 5   Thumb abduction - APB 5 5    Hip flexion 5 5   Hip extension 5 5   Hip adduction 5 5   Hip abduction 5 5   Knee extension 5 5   Knee flexion 5 5   Dorsiflexion 0 4-   Plantarflexion 4- 5   Inversion 0 5-   Eversion 0 5-    Reflexes:  Right Left  Bicep 2+ 2+  Tricep 2+ 2+  BrRad 2+ 2+  Knee 2+ 2+  Ankle 0 0   Sensation: Pinprick: Diminished in right foot, otherwise intact Vibration: Diminished in lower extremities (great toes, ankles, and patella). Intact in upper extremities Proprioception: Absent in right great toe, present in left great toe Coordination: Intact finger-to- nose-finger bilaterally. Gait: Able to rise from chair with arms crossed unassisted. Walks stooped due to back pain (too painful to walk upright)   Lab and Test Review: New results: 08/05/23: B12: 1911 B1 wnl CK 180  Previously reviewed results: External labs: 05/22/23: CMP significant for glucose 122 CBC significant for WBC 11.60 Uric acid wnl TSH wnl HbA1c: 6.2   Imaging/Procedures: MRI lumbar spine w/wo contrast (03/24/2016): IMPRESSION: 1. Negative for lumbosacral discitis or osteomyelitis, and normal SI joints. 2. Acute myositis of the bilateral medial gluteal muscles which could be infectious or inflammatory. No associated intramuscular abscess. 3. 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 Moderate to severe spinal and left lateral recess stenosis at L2-L3.  ASSESSMENT: This is Zachary Mcdonald, a 83 y.o. male with low back, hip, and leg pain (L > R). Patient's symptoms are likely due to lumbar stenosis and radiculopathy. His pain and left  leg weakness has increased since last visit, despite PT. Given this, his likely only option is surgery, though it is unclear if patient is a candidate. He has upcoming MRI lumbar spine and follow up with ortho after. Patient also has a left ulnar neuropathy causing left handed weakness that he had not previously been aware of until our first appointment. This is stable.  Plan: -Agree with MRI lumbar spine -Follow up with Dr. Terrie -Continue OTC pain relief as this helps patient most -Patient not interested in neuropathic pain medication currently -Discussed not leaning on elbow to prevent worsening of left ulnar neuropathy  -Fall precautions discussed  Return to clinic as needed  Total time spent reviewing records, interview, history/exam, documentation, and coordination of care on day of encounter:  35 min  Venetia Potters, MD     [1]  Allergies Allergen Reactions   Oxycontin  [Oxycodone  Hcl] Nausea And Vomiting and Other (See Comments)    Dizziness    Valsartan-Hydrochlorothiazide  Diarrhea  [2]  Social History Tobacco Use   Smoking status: Never   Smokeless tobacco: Never  Vaping Use   Vaping status: Never Used  Substance Use Topics   Alcohol  use: No    Alcohol /week: 0.0 standard drinks of alcohol    Drug use: No   "

## 2024-02-17 ENCOUNTER — Encounter: Payer: Self-pay | Admitting: Neurology

## 2024-02-17 ENCOUNTER — Ambulatory Visit (INDEPENDENT_AMBULATORY_CARE_PROVIDER_SITE_OTHER): Admitting: Neurology

## 2024-02-17 VITALS — BP 151/65 | HR 62 | Ht 73.0 in | Wt 169.0 lb

## 2024-02-17 DIAGNOSIS — M21372 Foot drop, left foot: Secondary | ICD-10-CM | POA: Diagnosis not present

## 2024-02-17 DIAGNOSIS — G5622 Lesion of ulnar nerve, left upper limb: Secondary | ICD-10-CM | POA: Diagnosis not present

## 2024-02-17 DIAGNOSIS — M48062 Spinal stenosis, lumbar region with neurogenic claudication: Secondary | ICD-10-CM

## 2024-02-17 DIAGNOSIS — M21371 Foot drop, right foot: Secondary | ICD-10-CM | POA: Diagnosis not present

## 2024-02-17 NOTE — Patient Instructions (Signed)
 I think your symptoms are coming from your back.  I agree with the MRI of your lumbar spine and follow up with Dr. Terrie about options for treatment.  We discussed neuropathic pain medications, but you would prefer to take ibuprofen  which is fine if that works for you.  Follow up with me as needed.  The physicians and staff at [facility] are committed to providing excellent care. You may receive a survey requesting feedback about your experience at our office. We strive to receive very good responses to the survey questions. If you feel that your experience would prevent you from giving the office a very good  response, please contact our office to try to remedy the situation. We may be reached at [phone]. Thank you for taking the time out of your busy day to complete the survey.  Zachary Potters, MD [facility]   Preventing Falls at [facility] are common, often dreaded events in the lives of older people. Aside from the obvious injuries and even death that may result, fall can cause wide-ranging consequences including loss of independence, mental decline, decreased activity and mobility. Younger people are also at risk of falling, especially those with chronic illnesses and fatigue.  Ways to reduce risk for falling Examine diet and medications. Warm foods and alcohol  dilate blood vessels, which can lead to dizziness when standing. Sleep aids, antidepressants and pain medications can also increase the likelihood of a fall.  Get a vision exam. Poor vision, cataracts and glaucoma increase the chances of falling.  Check foot gear. Shoes should fit snugly and have a sturdy, nonskid sole and a broad, low heel  Participate in a physician-approved exercise program to build and maintain muscle strength and improve balance and coordination. Programs that use ankle weights or stretch bands are excellent for muscle-strengthening. Water  aerobics programs and low-impact Tai Chi  programs have also been shown to improve balance and coordination.  Increase vitamin D intake. Vitamin D improves muscle strength and increases the amount of calcium  the body is able to absorb and deposit in bones.  How to prevent falls from common hazards Floors - Remove all loose wires, cords, and throw rugs. Minimize clutter. Make sure rugs are anchored and smooth. Keep furniture in its usual place.  Chairs -- Use chairs with straight backs, armrests and firm seats. Add firm cushions to existing pieces to add height.  Bathroom - Install grab bars and non-skid tape in the tub or shower. Use a bathtub transfer bench or a shower chair with a back support Use an elevated toilet seat and/or safety rails to assist standing from a low surface. Do not use towel racks or bathroom tissue holders to help you stand.  Lighting - Make sure halls, stairways, and entrances are well-lit. Install a night light in your bathroom or hallway. Make sure there is a light switch at the top and bottom of the staircase. Turn lights on if you get up in the middle of the night. Make sure lamps or light switches are within reach of the bed if you have to get up during the night.  Kitchen - Install non-skid rubber mats near the sink and stove. Clean spills immediately. Store frequently used utensils, pots, pans between waist and eye level. This helps prevent reaching and bending. Sit when getting things out of lower cupboards.  Living room/ Bedrooms - Place furniture with wide spaces in between, giving enough room to move around. Establish a route through the living room  that gives you something to hold onto as you walk.  Stairs - Make sure treads, rails, and rugs are secure. Install a rail on both sides of the stairs. If stairs are a threat, it might be helpful to arrange most of your activities on the lower level to reduce the number of times you must climb the stairs.  Entrances and doorways - Install metal handles on the  walls adjacent to the doorknobs of all doors to make it more secure as you travel through the doorway.  Tips for maintaining balance Keep at least one hand free at all times. Try using a backpack or fanny pack to hold things rather than carrying them in your hands. Never carry objects in both hands when walking as this interferes with keeping your balance.  Attempt to swing both arms from front to back while walking. This might require a conscious effort if Parkinson's disease has diminished your movement. It will, however, help you to maintain balance and posture, and reduce fatigue.  Consciously lift your feet off of the ground when walking. Shuffling and dragging of the feet is a common culprit in losing your balance.  When trying to navigate turns, use a U technique of facing forward and making a wide turn, rather than pivoting sharply.  Try to stand with your feet shoulder-length apart. When your feet are close together for any length of time, you increase your risk of losing your balance and falling.  Do one thing at a time. Don't try to walk and accomplish another task, such as reading or looking around. The decrease in your automatic reflexes complicates motor function, so the less distraction, the better.  Do not wear rubber or gripping soled shoes, they might catch on the floor and cause tripping.  Move slowly when changing positions. Use deliberate, concentrated movements and, if needed, use a grab bar or walking aid. Count 15 seconds between each movement. For example, when rising from a seated position, wait 15 seconds after standing to begin walking.  If balance is a continuous problem, you might want to consider a walking aid such as a cane, walking stick, or walker. Once you've mastered walking with help, you might be ready to try it on your own again.
